# Patient Record
Sex: Male | Born: 1975
Health system: Southern US, Community
[De-identification: ages and names within clinical notes are randomized; demographics above are authoritative.]

## PROBLEM LIST (undated history)

## (undated) DIAGNOSIS — C44300 Unspecified malignant neoplasm of skin of unspecified part of face: Secondary | ICD-10-CM

## (undated) DIAGNOSIS — J189 Pneumonia, unspecified organism: Secondary | ICD-10-CM

## (undated) DIAGNOSIS — F419 Anxiety disorder, unspecified: Secondary | ICD-10-CM

## (undated) DIAGNOSIS — E119 Type 2 diabetes mellitus without complications: Secondary | ICD-10-CM

## (undated) DIAGNOSIS — B019 Varicella without complication: Secondary | ICD-10-CM

## (undated) HISTORY — DX: Varicella without complication: B01.9

## (undated) HISTORY — PX: BACK SURGERY: SHX140

## (undated) HISTORY — DX: Unspecified malignant neoplasm of skin of unspecified part of face: C44.300

## (undated) HISTORY — PX: OTHER SURGICAL HISTORY: SHX169

---

## 1999-08-17 ENCOUNTER — Emergency Department (HOSPITAL_COMMUNITY): Admission: EM | Admit: 1999-08-17 | Discharge: 1999-08-17 | Payer: Self-pay | Admitting: Emergency Medicine

## 1999-12-21 ENCOUNTER — Encounter: Payer: Self-pay | Admitting: Emergency Medicine

## 1999-12-21 ENCOUNTER — Emergency Department (HOSPITAL_COMMUNITY): Admission: EM | Admit: 1999-12-21 | Discharge: 1999-12-21 | Payer: Self-pay | Admitting: Emergency Medicine

## 2004-12-14 ENCOUNTER — Ambulatory Visit: Payer: Self-pay

## 2007-01-02 ENCOUNTER — Emergency Department: Payer: Self-pay | Admitting: Unknown Physician Specialty

## 2007-03-01 ENCOUNTER — Ambulatory Visit: Payer: Self-pay | Admitting: General Practice

## 2007-04-20 ENCOUNTER — Ambulatory Visit: Payer: Self-pay | Admitting: Otolaryngology

## 2007-04-25 ENCOUNTER — Ambulatory Visit: Payer: Self-pay | Admitting: General Practice

## 2007-11-03 ENCOUNTER — Emergency Department: Payer: Self-pay | Admitting: Internal Medicine

## 2008-01-28 ENCOUNTER — Ambulatory Visit: Payer: Self-pay

## 2008-04-11 ENCOUNTER — Emergency Department: Payer: Self-pay | Admitting: Emergency Medicine

## 2008-06-09 ENCOUNTER — Ambulatory Visit: Payer: Self-pay | Admitting: Unknown Physician Specialty

## 2010-03-11 ENCOUNTER — Ambulatory Visit: Payer: Self-pay | Admitting: Family Medicine

## 2011-02-08 ENCOUNTER — Ambulatory Visit: Payer: Self-pay | Admitting: Family Medicine

## 2012-10-14 ENCOUNTER — Emergency Department: Payer: Self-pay | Admitting: Emergency Medicine

## 2012-10-14 LAB — RAPID INFLUENZA A&B ANTIGENS

## 2013-04-29 ENCOUNTER — Emergency Department: Payer: Self-pay | Admitting: Emergency Medicine

## 2013-05-21 ENCOUNTER — Other Ambulatory Visit: Payer: Self-pay | Admitting: Neurosurgery

## 2013-06-06 ENCOUNTER — Encounter (HOSPITAL_COMMUNITY)
Admission: RE | Admit: 2013-06-06 | Discharge: 2013-06-06 | Disposition: A | Discharge status: Home / Self Care | Payer: Self-pay | Source: Ambulatory Visit | Attending: Neurosurgery | Admitting: Neurosurgery

## 2013-06-06 NOTE — Pre-Procedure Instructions (Signed)
[place]  06/06/2013   Your procedure is scheduled on:  06/13/13  Report to [facility] at 530 AM.  Call this number if you have problems the morning of surgery: [phone]   Remember:   Do not eat food or drink liquids after midnight.   Take these medicines the morning of surgery with A SIP OF WATER: none   Do not wear jewelry, make-up or nail polish.  Do not wear lotions, powders, or perfumes. You may wear deodorant.  Do not shave 48 hours prior to surgery. Men may shave face and neck.  Do not bring valuables to the hospital.  [facility] is not responsible                   for any belongings or valuables.  Contacts, dentures or bridgework may not be worn into surgery.  Leave suitcase in the car. After surgery it may be brought to your room.  For patients admitted to the hospital, checkout time is 11:00 AM the day of  discharge.   Patients discharged the day of surgery will not be allowed to drive  home.  Name and phone number of your driver: family  Special Instructions: Shower using CHG 2 nights before surgery and the night before surgery.  If you shower the day of surgery use CHG.  Use special wash - you have one bottle of CHG for all showers.  You should use approximately 1/3 of the bottle for each shower.   Please read over the following fact sheets that you were given: Pain Booklet, Coughing and Deep Breathing and Surgical Site Infection Prevention

## 2013-06-13 ENCOUNTER — Ambulatory Visit (HOSPITAL_COMMUNITY): Admission: RE | Admit: 2013-06-13 | Payer: Self-pay | Source: Ambulatory Visit | Admitting: Neurosurgery

## 2013-06-13 ENCOUNTER — Encounter (HOSPITAL_COMMUNITY): Admission: RE | Payer: Self-pay | Source: Ambulatory Visit

## 2013-06-13 SURGERY — ANTERIOR CERVICAL DECOMPRESSION/DISCECTOMY FUSION 1 LEVEL
Anesthesia: General

## 2014-04-09 DIAGNOSIS — Z0289 Encounter for other administrative examinations: Secondary | ICD-10-CM

## 2014-06-13 ENCOUNTER — Encounter (HOSPITAL_COMMUNITY): Payer: Self-pay | Admitting: Emergency Medicine

## 2014-06-13 DIAGNOSIS — Z79899 Other long term (current) drug therapy: Secondary | ICD-10-CM | POA: Insufficient documentation

## 2014-06-13 DIAGNOSIS — Z043 Encounter for examination and observation following other accident: Secondary | ICD-10-CM | POA: Insufficient documentation

## 2014-06-13 DIAGNOSIS — W1809XA Striking against other object with subsequent fall, initial encounter: Secondary | ICD-10-CM | POA: Insufficient documentation

## 2014-06-13 DIAGNOSIS — F411 Generalized anxiety disorder: Secondary | ICD-10-CM | POA: Insufficient documentation

## 2014-06-13 DIAGNOSIS — Y92009 Unspecified place in unspecified non-institutional (private) residence as the place of occurrence of the external cause: Secondary | ICD-10-CM | POA: Insufficient documentation

## 2014-06-13 DIAGNOSIS — Z9889 Other specified postprocedural states: Secondary | ICD-10-CM | POA: Insufficient documentation

## 2014-06-13 DIAGNOSIS — R42 Dizziness and giddiness: Secondary | ICD-10-CM | POA: Insufficient documentation

## 2014-06-13 DIAGNOSIS — R404 Transient alteration of awareness: Secondary | ICD-10-CM | POA: Insufficient documentation

## 2014-06-13 DIAGNOSIS — Y9389 Activity, other specified: Secondary | ICD-10-CM | POA: Insufficient documentation

## 2014-06-13 NOTE — ED Notes (Signed)
Pt. felt dizzy lost his balance and fell at home this evening , denies LOC / ambulatory , pt. stated he hit his left face against the wall , alert and oriented / respirations unlabored .

## 2014-06-14 ENCOUNTER — Emergency Department (HOSPITAL_COMMUNITY): Payer: 59

## 2014-06-14 ENCOUNTER — Emergency Department (HOSPITAL_COMMUNITY)
Admission: EM | Admit: 2014-06-14 | Discharge: 2014-06-14 | Disposition: A | Discharge status: Home / Self Care | Payer: 59 | Attending: Emergency Medicine | Admitting: Emergency Medicine

## 2014-06-14 DIAGNOSIS — R4020 Unspecified coma: Secondary | ICD-10-CM

## 2014-06-14 HISTORY — DX: Anxiety disorder, unspecified: F41.9

## 2014-06-14 LAB — COMPREHENSIVE METABOLIC PANEL
ALT: 23 U/L (ref 0–53)
AST: 19 U/L (ref 0–37)
Albumin: 3.8 g/dL (ref 3.5–5.2)
Alkaline Phosphatase: 66 U/L (ref 39–117)
Anion gap: 15 (ref 5–15)
BUN: 16 mg/dL (ref 6–23)
CO2: 21 mEq/L (ref 19–32)
Calcium: 8.6 mg/dL (ref 8.4–10.5)
Chloride: 101 mEq/L (ref 96–112)
Creatinine, Ser: 0.88 mg/dL (ref 0.50–1.35)
GFR calc Af Amer: >90 mL/min (ref >90)
GFR calc non Af Amer: >90 mL/min (ref >90)
Glucose, Bld: 204 mg/dL — ABNORMAL HIGH (ref 70–99)
Potassium: 4.1 mEq/L (ref 3.7–5.3)
Sodium: 137 mEq/L (ref 137–147)
Total Bilirubin: 0.3 mg/dL (ref 0.3–1.2)
Total Protein: 6.7 g/dL (ref 6.0–8.3)

## 2014-06-14 LAB — CBC WITH DIFFERENTIAL/PLATELET
Basophils Absolute: 0 10*3/uL (ref 0.0–0.1)
Basophils Relative: 0 % (ref 0–1)
Eosinophils Absolute: 0.1 10*3/uL (ref 0.0–0.7)
Eosinophils Relative: 2 % (ref 0–5)
HCT: 42.7 % (ref 39.0–52.0)
Hemoglobin: 15.3 g/dL (ref 13.0–17.0)
Lymphocytes Relative: 27 % (ref 12–46)
Lymphs Abs: 1.6 10*3/uL (ref 0.7–4.0)
MCH: 32.5 pg (ref 26.0–34.0)
MCHC: 35.8 g/dL (ref 30.0–36.0)
MCV: 90.7 fL (ref 78.0–100.0)
Monocytes Absolute: 0.5 10*3/uL (ref 0.1–1.0)
Monocytes Relative: 9 % (ref 3–12)
Neutro Abs: 3.7 10*3/uL (ref 1.7–7.7)
Neutrophils Relative %: 62 % (ref 43–77)
Platelets: 252 10*3/uL (ref 150–400)
RBC: 4.71 MIL/uL (ref 4.22–5.81)
RDW: 13 % (ref 11.5–15.5)
WBC: 6 10*3/uL (ref 4.0–10.5)

## 2014-06-14 NOTE — ED Provider Notes (Signed)
CSN: [id number]     Arrival date & time 06/13/14  2338 History   First MD Initiated Contact with Patient 06/14/14 0028     Chief Complaint  Patient presents with  . Fall     (Consider location/radiation/quality/duration/timing/severity/associated sxs/prior Treatment) HPI The patient presents after an episode of near syncope. Patient recalls feeling flushed, and soon after using the bathroom, and after standing up, felt lightheaded, lost his balance, staggered, and fell.  He denies complete loss of consciousness, but felt near syncopal for several moments. There is no ongoing chest pain throughout, no headache, no confusion, no disorientation. Symptoms have resolved entirely. No similar prior episodes. Patient notes a history of anxiety, cervical spine surgery. Patient currently taking phentermine, clonazepam, Flexeril, ibuprofen. Patient had intercourse approximately 20 minutes prior to this.  Past Medical History  Diagnosis Date  . Anxiety    Past Surgical History  Procedure Laterality Date  . Back surgery     No family history on file. History  Substance Use Topics  . Smoking status: Never Smoker   . Smokeless tobacco: Not on file  . Alcohol Use: Yes    Review of Systems  Constitutional:       Per HPI, otherwise negative  HENT:       Per HPI, otherwise negative  Respiratory:       Per HPI, otherwise negative  Cardiovascular:       Per HPI, otherwise negative  Gastrointestinal: Negative for vomiting.  Endocrine:       Negative aside from HPI  Genitourinary:       Neg aside from HPI   Musculoskeletal:       Per HPI, otherwise negative  Skin: Negative.   Neurological: Positive for dizziness and light-headedness. Negative for syncope, facial asymmetry and headaches.      Allergies  Bee venom  Home Medications   Prior to Admission medications   Medication Sig Start Date End Date Taking? Authorizing Provider  clonazePAM (KLONOPIN) 0.5 MG tablet Take 0.5 mg  by mouth 2 (two) times daily as needed for anxiety.   Yes Historical Provider, MD  cyclobenzaprine (FLEXERIL) 10 MG tablet Take 5 mg by mouth 3 (three) times daily as needed for muscle spasms.   Yes Historical Provider, MD   BP 101/60  Pulse 94  Temp(Src) 98.5 F (36.9 C) (Oral)  Resp 17  Ht 6' (1.829 m)  Wt 243 lb (110.224 kg)  BMI 32.95 kg/m2  SpO2 97% Physical Exam  Nursing note and vitals reviewed. Constitutional: He is oriented to person, place, and time. He appears well-developed. No distress.  HENT:  Head: Normocephalic and atraumatic.  Eyes: Conjunctivae and EOM are normal.  Cardiovascular: Normal rate and regular rhythm.   Pulmonary/Chest: Effort normal. No stridor. No respiratory distress.  Abdominal: He exhibits no distension.  Musculoskeletal: He exhibits no edema.  Neurological: He is alert and oriented to person, place, and time. No cranial nerve deficit. He exhibits normal muscle tone. Coordination normal.  Skin: Skin is warm and dry.  Psychiatric: He has a normal mood and affect.    ED Course  Procedures (including critical care time) Labs Review Labs Reviewed  COMPREHENSIVE METABOLIC PANEL - Abnormal; Notable for the following:    Glucose, Bld 204 (*)    All other components within normal limits  CBC WITH DIFFERENTIAL    Imaging Review Dg Chest 2 View  06/14/2014   CLINICAL DATA:  Near syncope.  EXAM: CHEST  2 VIEW  COMPARISON:  None.  FINDINGS: Markedly suboptimal inspiration with atelectasis in the left lower lobe. Lungs otherwise clear. No localized airspace consolidation. No pleural effusions. No pneumothorax. Normal pulmonary vascularity. Cardiomediastinal silhouette unremarkable. Visualized bony thorax intact.  IMPRESSION: Markedly suboptimal inspiration accounts for atelectasis in the left lower lobe. No acute cardiopulmonary disease otherwise.   Electronically Signed   By: Evangeline Dakin M.D.   On: 06/14/2014 01:23     EKG  Interpretation   Date/Time:  Friday June 13 2014 23:41:36 EDT Ventricular Rate:  108 PR Interval:  130 QRS Duration: 88 QT Interval:  322 QTC Calculation: 431 R Axis:   60 Text Interpretation:  Sinus tachycardia Nonspecific ST abnormality  Abnormal ECG Sinus tachycardia delta waves ? ST-t wave abnormality  Abnormal ekg Confirmed by Carmin Muskrat  MD ([id number]) on 06/14/2014 12:28:51  AM     3:48 AM Patient asleep.  He awakens easily.  I discussed the findings again with him and his wife. Patient will follow up with primary care and/or cardiology on Monday for Holter monitoring. Patient is essentially asymptomatic, though he states he feels mildly"off". Return precautions, follow up instructions reinforced MDM   Patient presents after a likely syncopal events. Patient is awake and alert, neurologically intact and in stable, and in no distress.  Per the patient's emergency room course, he remained hemodynamically stable, with slight decrease in tachycardia, and now a new episode of lightheadedness, syncope, pain. Patient's evaluation is largely reassuring, and after several hours of monitoring, with no decompensation the patient was discharged in stable condition to followup with primary care or cardiology do to his possible syncopal events, which seems most consistent with vagal etiology.     Carmin Muskrat, MD 06/14/14 [phone]

## 2014-06-14 NOTE — Discharge Instructions (Signed)
As discussed, your evaluation tonight has been largely reassuring.  Your episode of losing consciousness is likely due to vagal syncope.  However, it is important to follow up with either your primary care physician or our cardiologists for appropriate ongoing evaluation.  Return here if you develop new, or concerning changes in the interim

## 2014-07-28 ENCOUNTER — Encounter: Payer: Self-pay | Admitting: Internal Medicine

## 2014-07-28 ENCOUNTER — Ambulatory Visit (INDEPENDENT_AMBULATORY_CARE_PROVIDER_SITE_OTHER): Payer: 59 | Admitting: Internal Medicine

## 2014-07-28 VITALS — BP 124/68 | HR 82 | Temp 98.7°F | Ht 70.66 in | Wt 237.0 lb

## 2014-07-28 DIAGNOSIS — E669 Obesity, unspecified: Secondary | ICD-10-CM

## 2014-07-28 DIAGNOSIS — F411 Generalized anxiety disorder: Secondary | ICD-10-CM

## 2014-07-28 MED ORDER — CITALOPRAM HYDROBROMIDE 10 MG PO TABS: 10.0000 mg | ORAL_TABLET | Freq: Every day | ORAL | Status: DC

## 2014-07-28 NOTE — Assessment & Plan Note (Signed)
Encouraged him to continue to work on diet and exercise He wants to continue phentermine but has plenty of refills left from previous provider

## 2014-07-28 NOTE — Progress Notes (Signed)
HPI  Pt presents to the clinic today to establish care. He is transferring care from Dr. Brunetta Genera. He does have some concerns about his anxiety medicine. He reports he does not like to take the clonazepam. He will take it when his nerves get very bad. He reports this occurs 2-3 times per week. He denies depression, SI/HI.  Flu: 2014, wants one today Tetanus: 2009 Dentist: biannually  Past Medical History  Diagnosis Date  . Anxiety   . Chicken pox   . Skin cancer of face     Current Outpatient Prescriptions  Medication Sig Dispense Refill  . cholecalciferol (VITAMIN D) 1000 UNITS tablet Take 1,000 Units by mouth once a week.      . phentermine 37.5 MG capsule Take 37.5 mg by mouth every morning.       No current facility-administered medications for this visit.    Allergies  Allergen Reactions  . Bee Venom Anaphylaxis    Family History  Problem Relation Age of Onset  . Heart disease Mother   . Diabetes Mother   . Heart disease Father   . Diabetes Father     History   Social History  . Marital Status: Married    Spouse Name: N/A    Number of Children: N/A  . Years of Education: N/A   Occupational History  . Not on file.   Social History Main Topics  . Smoking status: Never Smoker   . Smokeless tobacco: Current User    Types: Chew  . Alcohol Use: Yes     Comment: occasional  . Drug Use: No  . Sexual Activity: Not on file   Other Topics Concern  . Not on file   Social History Narrative  . No narrative on file    ROS:  Constitutional: Denies fever, malaise, fatigue, headache or abrupt weight changes.  Respiratory: Denies difficulty breathing, shortness of breath, cough or sputum production.   Cardiovascular: Denies chest pain, chest tightness, palpitations or swelling in the hands or feet.  Neurological: Denies dizziness, difficulty with memory, difficulty with speech or problems with balance and coordination.  Psych: Pt reports anxiety. Denies  depression, SI/HI.  No other specific complaints in a complete review of systems (except as listed in HPI above).  PE:  BP 124/68  Pulse 82  Temp(Src) 98.7 F (37.1 C) (Oral)  Ht 5' 10.66" (1.795 m)  Wt 237 lb (107.502 kg)  BMI 33.36 kg/m2  SpO2 98% Wt Readings from Last 3 Encounters:  07/28/14 237 lb (107.502 kg)  06/13/14 243 lb (110.224 kg)    General: Appears his stated age, obese but well developed, well nourished in NAD. Cardiovascular: Normal rate and rhythm. S1,S2 noted.  No murmur, rubs or gallops noted. Pulmonary/Chest: Normal effort and positive vesicular breath sounds. No respiratory distress. No wheezes, rales or ronchi noted.  Psychiatric: Mood and affect normal. Behavior is normal. Judgment and thought content normal.     BMET    Component Value Date/Time   NA 137 06/13/2014 2352   K 4.1 06/13/2014 2352   CL 101 06/13/2014 2352   CO2 21 06/13/2014 2352   GLUCOSE 204* 06/13/2014 2352   BUN 16 06/13/2014 2352   CREATININE 0.88 06/13/2014 2352   CALCIUM 8.6 06/13/2014 2352   GFRNONAA >90 06/13/2014 2352   GFRAA >90 06/13/2014 2352    CBC    Component Value Date/Time   WBC 6.0 06/13/2014 2352   RBC 4.71 06/13/2014 2352   HGB 15.3 06/13/2014 2352  HCT 42.7 06/13/2014 2352   PLT 252 06/13/2014 2352   MCV 90.7 06/13/2014 2352   MCH 32.5 06/13/2014 2352   MCHC 35.8 06/13/2014 2352   RDW 13.0 06/13/2014 2352   LYMPHSABS 1.6 06/13/2014 2352   MONOABS 0.5 06/13/2014 2352   EOSABS 0.1 06/13/2014 2352   BASOSABS 0.0 06/13/2014 2352      Assessment and Plan:  Health Maintenance:  Flu shot today  RTC in 3 months for annual physical

## 2014-07-28 NOTE — Assessment & Plan Note (Signed)
Stop clonazepam Start Celexa

## 2014-07-28 NOTE — Patient Instructions (Signed)
Generalized Anxiety Disorder Generalized anxiety disorder (GAD) is a mental disorder. It interferes with life functions, including relationships, work, and school. GAD is different from normal anxiety, which everyone experiences at some point in their lives in response to specific life events and activities. Normal anxiety actually helps us prepare for and get through these life events and activities. Normal anxiety goes away after the event or activity is over.  GAD causes anxiety that is not necessarily related to specific events or activities. It also causes excess anxiety in proportion to specific events or activities. The anxiety associated with GAD is also difficult to control. GAD can vary from mild to severe. People with severe GAD can have intense waves of anxiety with physical symptoms (panic attacks).  SYMPTOMS The anxiety and worry associated with GAD are difficult to control. This anxiety and worry are related to many life events and activities and also occur more days than not for 6 months or longer. People with GAD also have three or more of the following symptoms (one or more in children):  Restlessness.   Fatigue.  Difficulty concentrating.   Irritability.  Muscle tension.  Difficulty sleeping or unsatisfying sleep. DIAGNOSIS GAD is diagnosed through an assessment by your health care provider. Your health care provider will ask you questions aboutyour mood,physical symptoms, and events in your life. Your health care provider may ask you about your medical history and use of alcohol or drugs, including prescription medicines. Your health care provider may also do a physical exam and blood tests. Certain medical conditions and the use of certain substances can cause symptoms similar to those associated with GAD. Your health care provider may refer you to a mental health specialist for further evaluation. TREATMENT The following therapies are usually used to treat GAD:    Medication. Antidepressant medication usually is prescribed for long-term daily control. Antianxiety medicines may be added in severe cases, especially when panic attacks occur.   Talk therapy (psychotherapy). Certain types of talk therapy can be helpful in treating GAD by providing support, education, and guidance. A form of talk therapy called cognitive behavioral therapy can teach you healthy ways to think about and react to daily life events and activities.  Stress managementtechniques. These include yoga, meditation, and exercise and can be very helpful when they are practiced regularly. A mental health specialist can help determine which treatment is best for you. Some people see improvement with one therapy. However, other people require a combination of therapies. Document Released: 02/25/2013 Document Revised: 03/17/2014 Document Reviewed: 02/25/2013 Exit[employer] Patient Information 2015 [employer]. This information is not intended to replace advice given to you by your health care provider. Make sure you discuss any questions you have with your health care provider.  

## 2014-07-28 NOTE — Progress Notes (Signed)
Pre visit review using our clinic review tool, if applicable. No additional management support is needed unless otherwise documented below in the visit note. 

## 2014-09-15 ENCOUNTER — Encounter: Payer: Self-pay | Admitting: Internal Medicine

## 2014-09-15 ENCOUNTER — Ambulatory Visit (INDEPENDENT_AMBULATORY_CARE_PROVIDER_SITE_OTHER): Payer: 59 | Admitting: Internal Medicine

## 2014-09-15 VITALS — BP 120/74 | HR 87 | Temp 98.8°F | Wt 245.0 lb

## 2014-09-15 DIAGNOSIS — L989 Disorder of the skin and subcutaneous tissue, unspecified: Secondary | ICD-10-CM

## 2014-09-15 NOTE — Progress Notes (Signed)
Subjective:    Patient ID: Joshua Bean, male    DOB: 1976-06-17, 38 y.o.   MRN: [id number]  HPI  Pt presents to the clinic today requesting referral for dermatology. He reports that over the last few months, he has noticed a few spots on his face and hands that he would like to have checked. The areas do not itch or burn but they do bother him. He has had precancerous cells on his face frozen off multiple times. He reports that they always come back and end up looking worse. He has been seen by [facility]. He does not want to go back there.  Review of Systems  Past Medical History  Diagnosis Date  . Anxiety   . Chicken pox   . Skin cancer of face     Current Outpatient Prescriptions  Medication Sig Dispense Refill  . cyclobenzaprine (FLEXERIL) 10 MG tablet Take 5 mg by mouth as needed for muscle spasms.    . meloxicam (MOBIC) 15 MG tablet Take 15 mg by mouth daily as needed for pain.     No current facility-administered medications for this visit.    Allergies  Allergen Reactions  . Bee Venom Anaphylaxis    Family History  Problem Relation Age of Onset  . Heart disease Mother   . Diabetes Mother   . Heart disease Father   . Diabetes Father   . Cancer Neg Hx   . Stroke Neg Hx     History   Social History  . Marital Status: Married    Spouse Name: N/A    Number of Children: N/A  . Years of Education: N/A   Occupational History  . Not on file.   Social History Main Topics  . Smoking status: Never Smoker   . Smokeless tobacco: Current User    Types: Chew  . Alcohol Use: Yes     Comment: occasional  . Drug Use: No  . Sexual Activity: Yes   Other Topics Concern  . Not on file   Social History Narrative     Constitutional: Denies fever, malaise, fatigue, headache or abrupt weight changes.  Skin: Pt reports lesions on face. Denies redness, rashes, or ulcercations.    No other specific complaints in a complete review of systems (except as  listed in HPI above).     Objective:   Physical Exam  BP 120/74 mmHg  Pulse 87  Temp(Src) 98.8 F (37.1 C) (Oral)  Wt 245 lb (111.131 kg)  SpO2 98% Wt Readings from Last 3 Encounters:  09/15/14 245 lb (111.131 kg)  07/28/14 237 lb (107.502 kg)  06/13/14 243 lb (110.224 kg)    General: Appears his stated age, obese but well developed, well nourished in NAD. Skin: Round < 1 cm scaly lesion noted just under left eye. Round < 1 cm scaly lesion noted on left upper cheek. Dark, oval, < 1 cm lesion noted on right side of nose. Dark, oval, < 1 cm lesion noted on right cheek. Cardiovascular: Normal rate and rhythm. S1,S2 noted.  No murmur, rubs or gallops noted. Pulmonary/Chest: Normal effort and positive vesicular breath sounds. No respiratory distress. No wheezes, rales or ronchi noted.   BMET    Component Value Date/Time   NA 137 06/13/2014 2352   K 4.1 06/13/2014 2352   CL 101 06/13/2014 2352   CO2 21 06/13/2014 2352   GLUCOSE 204* 06/13/2014 2352   BUN 16 06/13/2014 2352   CREATININE 0.88 06/13/2014  2352   CALCIUM 8.6 06/13/2014 2352   GFRNONAA >90 06/13/2014 2352   GFRAA >90 06/13/2014 2352    Lipid Panel  No results found for: CHOL, TRIG, HDL, CHOLHDL, VLDL, LDLCALC  CBC    Component Value Date/Time   WBC 6.0 06/13/2014 2352   RBC 4.71 06/13/2014 2352   HGB 15.3 06/13/2014 2352   HCT 42.7 06/13/2014 2352   PLT 252 06/13/2014 2352   MCV 90.7 06/13/2014 2352   MCH 32.5 06/13/2014 2352   MCHC 35.8 06/13/2014 2352   RDW 13.0 06/13/2014 2352   LYMPHSABS 1.6 06/13/2014 2352   MONOABS 0.5 06/13/2014 2352   EOSABS 0.1 06/13/2014 2352   BASOSABS 0.0 06/13/2014 2352    Hgb A1C No results found for: HGBA1C       Assessment & Plan:   Multiple skin lesions of face:  Because they are on his face, I would prefer they be evaluated/excised by derm Instructed him to avoid touching/scratching the areas Will refer to derm- see Rosaria Ferries on your way out  RTC as needed  or if symptoms persist or worsen

## 2014-09-15 NOTE — Patient Instructions (Signed)
Excision of Skin Lesions  Excision of a skin lesion refers to the removal of a section of skin by making small cuts (incisions) in the skin. This is typically done to remove a cancerous growth (basal cell carcinoma, squamous cell carcinoma, or melanoma) or a noncancerous growth (cyst). It may be done to treat or prevent cancer or infection. It may also be done to improve cosmetic appearance (removal of mole, skin tag).  LET YOUR CAREGIVER KNOW ABOUT:   · Allergies to food or medicine.  · Medicines taken, including vitamins, herbs, eyedrops, over-the-counter medicines, and creams.  · Use of steroids (by mouth or creams).  · Previous problems with anesthetics or numbing medicines.  · History of bleeding problems or blood clots.  · History of any prostheses.  · Previous surgery.  · Other health problems, including diabetes and kidney problems.  · Possibility of pregnancy, if this applies.  RISKS AND COMPLICATIONS   Many complications can be managed. With appropriate treatment and rehabilitation, the following complications are very uncommon:  · Bleeding.  · Infection.  · Scarring.  · Recurrence of cyst or cancer.  · Changes in skin sensation or appearance (discoloration, swelling).  · Reaction to anesthesia.  · Allergic reaction to surgical materials or ointments.  · Damage to nerves, blood vessels, muscles, or other structures.  · Continued pain.  BEFORE THE PROCEDURE   It is important to follow your caregiver's instructions prior to your procedure to avoid complications. Steps before your procedure may include:  · Physical exam, blood tests, other procedures, such as removing a small sample for examination under a microscope (biopsy).  · Your caregiver may review the procedure, the anesthesia being used, and what to expect after the procedure with you.  You may be asked to:  · Stop taking certain medicines, such as blood thinners (including aspirin, clopidogrel, ibuprofen), for several days prior to your  procedure.  · Take certain medicines.  · Stop smoking.  It is a good idea to arrange for a ride home after surgery and to have someone to help you with activities during recovery.  PROCEDURE   There are several excision techniques. The type of excision or surgical technique used will depend on your condition, the location of the lesion, and your overall health. After the lesion is sterilized and a local anesthetic is applied, the following may be performed:  Complete surgical excision  The area to be removed is marked with a pen. Using a small scalpel and scissors, the surgeon gently cuts around and under the lesion until it is completely removed. The lesion is placed in a special fluid and sent to the lab for examination. If necessary, bleeding will be controlled with a device that delivers heat. The edges of the wound are stitched together and a dressing is applied. This procedure may be performed to treat a cancerous growth or noncancerous cyst or lesion. Surgeons commonly perform an elliptical excision, to minimize scarring.  Excision of a cyst  The surgeon makes an incision on the cyst. The entire cyst is removed through the incision. The wound may be closed with a suture (stitch).  Shave excision  During shave excision, the surgeon uses a small blade or loop instrument to shave off the lesion. This may be done to remove a mole or skin tag. The wound is usually left to heal on its own without stitches.  Punch excision  During punch excision, the surgeon uses a small, round tool (like a cookie   cutter) to cut a circle shape out of the skin. The outer edges of the skin are stitched together. This may be done to remove a mole or scar or to perform a biopsy of the lesion.  Mohs micrographic surgery  During Mohs micrographic surgery, layers of the lesion are removed with a scalpel or loop instrument and immediately examined under a microscope until all of the abnormal or cancerous tissue is removed. This procedure is  minimally invasive and ensures the best cosmetic outcome, with removal of as little normal tissue as possible. Mohs is usually done to treat skin cancer, such as basal cell carcinoma or squamous cell carcinoma, particularly on the face and ears.  Antibiotic ointment is applied to the surgical area after each of the procedures listed above, as necessary.  AFTER THE PROCEDURE   How well you heal depends on many factors. Most patients heal quite well with proper techniques and self-care. Scarring will lessen over time.  HOME CARE INSTRUCTIONS   · Take medicines for pain as directed.  · Keep the incision area clean, dry, and protected for at least 48 hours. Change dressings as directed.  · For bleeding, apply gentle but firm pressure to the wound using a folded towel for 20 minutes. Call your caregiver if bleeding does not stop.  · Avoid high-impact exercise and activities until the stitches are removed or the area heals.  · Follow your caregiver's instructions to minimize scarring. Avoid sun exposure until the area has healed. Scarring should lessen over time.  · Follow up with your caregiver as directed. Removal of stitches within 4 to 14 days may be necessary.  Finding out the results of your test  Not all test results are available during your visit. If your test results are not back during the visit, make an appointment with your caregiver to find out the results. Do not assume everything is normal if you have not heard from your caregiver or the medical facility. It is important for you to follow up on all of your test results.  SEEK MEDICAL CARE IF:   · You or your child has an oral temperature above 102° F (38.9° C).  · You develop signs of infection (chills, feeling unwell).  · You notice bleeding, pain, discharge, redness, or swelling at the incision site.  · You notice skin irregularities or changes in sensation.  MAKE SURE YOU:   · Understand these instructions.  · Will watch your condition.  · Will get help  right away if you are not doing well or get worse.  FOR MORE INFORMATION   American Academy of Family Physicians: www.aafp.org  American Academy of Dermatology: www.aad.org  Document Released: 01/25/2010 Document Revised: 01/23/2012 Document Reviewed: 01/25/2010  ExitCare® Patient Information ©2015 [employer]. This information is not intended to replace advice given to you by your health care provider. Make sure you discuss any questions you have with your health care provider.

## 2014-09-15 NOTE — Progress Notes (Signed)
Pre visit review using our clinic review tool, if applicable. No additional management support is needed unless otherwise documented below in the visit note. 

## 2014-09-16 ENCOUNTER — Telehealth: Payer: Self-pay | Admitting: Internal Medicine

## 2014-09-16 NOTE — Telephone Encounter (Signed)
emmi mailed  °

## 2014-10-13 ENCOUNTER — Other Ambulatory Visit: Payer: Self-pay | Admitting: Internal Medicine

## 2014-10-13 DIAGNOSIS — Z0000 Encounter for general adult medical examination without abnormal findings: Secondary | ICD-10-CM

## 2014-10-20 ENCOUNTER — Other Ambulatory Visit: Payer: 59

## 2014-10-21 ENCOUNTER — Other Ambulatory Visit (INDEPENDENT_AMBULATORY_CARE_PROVIDER_SITE_OTHER): Payer: 59

## 2014-10-21 DIAGNOSIS — Z0000 Encounter for general adult medical examination without abnormal findings: Secondary | ICD-10-CM

## 2014-10-21 NOTE — Addendum Note (Signed)
Addended by: Ellamae Sia on: 10/21/2014 02:25 PM   Modules accepted: Orders

## 2014-10-22 LAB — CBC WITH DIFFERENTIAL
Basophils Absolute: 0.1 10*3/uL (ref 0.0–0.2)
Basos: 1 %
Eos: 4 %
Eosinophils Absolute: 0.2 10*3/uL (ref 0.0–0.4)
HCT: 45.4 % (ref 37.5–51.0)
Hemoglobin: 15.7 g/dL (ref 12.6–17.7)
Immature Grans (Abs): 0.1 10*3/uL (ref 0.0–0.1)
Immature Granulocytes: 1 %
Lymphocytes Absolute: 2.6 10*3/uL (ref 0.7–3.1)
Lymphs: 43 %
MCH: 31.5 pg (ref 26.6–33.0)
MCHC: 34.6 g/dL (ref 31.5–35.7)
MCV: 91 fL (ref 79–97)
Monocytes Absolute: 0.5 10*3/uL (ref 0.1–0.9)
Monocytes: 8 %
Neutrophils Absolute: 2.6 10*3/uL (ref 1.4–7.0)
Neutrophils Relative %: 43 %
Platelets: 278 10*3/uL (ref 150–379)
RBC: 4.99 x10E6/uL (ref 4.14–5.80)
RDW: 13.6 % (ref 12.3–15.4)
WBC: 6 10*3/uL (ref 3.4–10.8)

## 2014-10-22 LAB — COMPREHENSIVE METABOLIC PANEL
ALT: 25 IU/L (ref 0–44)
AST: 18 IU/L (ref 0–40)
Albumin/Globulin Ratio: 1.9 (ref 1.1–2.5)
Albumin: 4.6 g/dL (ref 3.5–5.5)
Alkaline Phosphatase: 50 IU/L (ref 39–117)
BUN/Creatinine Ratio: 20 — ABNORMAL HIGH (ref 8–19)
BUN: 17 mg/dL (ref 6–20)
CO2: 20 mmol/L (ref 18–29)
Calcium: 9.4 mg/dL (ref 8.7–10.2)
Chloride: 102 mmol/L (ref 97–108)
Creatinine, Ser: 0.84 mg/dL (ref 0.76–1.27)
GFR calc Af Amer: 129 mL/min/{1.73_m2} (ref >59)
GFR calc non Af Amer: 112 mL/min/{1.73_m2} (ref >59)
Globulin, Total: 2.4 g/dL (ref 1.5–4.5)
Glucose: 145 mg/dL — ABNORMAL HIGH (ref 65–99)
Potassium: 4.5 mmol/L (ref 3.5–5.2)
Sodium: 140 mmol/L (ref 134–144)
Total Bilirubin: 0.5 mg/dL (ref 0.0–1.2)
Total Protein: 7 g/dL (ref 6.0–8.5)

## 2014-10-22 LAB — LIPID PANEL
Chol/HDL Ratio: 6.1 ratio units — ABNORMAL HIGH (ref 0.0–5.0)
Cholesterol, Total: 221 mg/dL — ABNORMAL HIGH (ref 100–199)
HDL: 36 mg/dL — ABNORMAL LOW (ref >39)
Triglycerides: 463 mg/dL — ABNORMAL HIGH (ref 0–149)

## 2014-10-27 ENCOUNTER — Encounter: Payer: 59 | Admitting: Internal Medicine

## 2014-11-03 ENCOUNTER — Encounter: Payer: Self-pay | Admitting: Internal Medicine

## 2014-11-03 ENCOUNTER — Ambulatory Visit (INDEPENDENT_AMBULATORY_CARE_PROVIDER_SITE_OTHER): Payer: 59 | Admitting: Internal Medicine

## 2014-11-03 VITALS — BP 118/76 | HR 99 | Temp 99.3°F | Ht 70.75 in | Wt 244.0 lb

## 2014-11-03 DIAGNOSIS — E1165 Type 2 diabetes mellitus with hyperglycemia: Secondary | ICD-10-CM | POA: Insufficient documentation

## 2014-11-03 DIAGNOSIS — Z0000 Encounter for general adult medical examination without abnormal findings: Secondary | ICD-10-CM

## 2014-11-03 DIAGNOSIS — E785 Hyperlipidemia, unspecified: Secondary | ICD-10-CM | POA: Insufficient documentation

## 2014-11-03 NOTE — Progress Notes (Signed)
Subjective:    Patient ID: Joshua Bean, male    DOB: December 13, 1975, 38 y.o.   MRN: [id number]  HPI  Pt presents to the clinic today for his annual exam.  Flu: 07/2014 Tetanus: 2008, he thinks Dentist: biannually  Diet: he does not adhere to any specific diet Exercise: he does not exercise  He had his labs prior to his appt. Cholesterol was elevated. Total was 221, HDL 36, LDL not calculated due to Triglyceride of 463. He has been on cholesterol medication in the past but can not remember the name of it.  Review of Systems      Past Medical History  Diagnosis Date  . Anxiety   . Chicken pox   . Skin cancer of face     Current Outpatient Prescriptions  Medication Sig Dispense Refill  . cyclobenzaprine (FLEXERIL) 10 MG tablet Take 5 mg by mouth as needed for muscle spasms.    Marland Kitchen triamcinolone (NASACORT) 55 MCG/ACT AERO nasal inhaler Place 2 sprays into the nose daily.    . meloxicam (MOBIC) 15 MG tablet Take 15 mg by mouth daily as needed for pain.     No current facility-administered medications for this visit.    Allergies  Allergen Reactions  . Bee Venom Anaphylaxis    Family History  Problem Relation Age of Onset  . Heart disease Mother   . Diabetes Mother   . Heart disease Father   . Diabetes Father   . Cancer Neg Hx   . Stroke Neg Hx     History   Social History  . Marital Status: Married    Spouse Name: N/A    Number of Children: N/A  . Years of Education: N/A   Occupational History  . Not on file.   Social History Main Topics  . Smoking status: Never Smoker   . Smokeless tobacco: Current User    Types: Chew  . Alcohol Use: 0.0 oz/week    0 Not specified per week     Comment: occasional  . Drug Use: No  . Sexual Activity: Yes   Other Topics Concern  . Not on file   Social History Narrative     Constitutional: Denies fever, malaise, fatigue, headache or abrupt weight changes.  HEENT: Denies eye pain, eye redness, ear pain, ringing in  the ears, wax buildup, runny nose, nasal congestion, bloody nose, or sore throat. Respiratory: Denies difficulty breathing, shortness of breath, cough or sputum production.   Cardiovascular: Denies chest pain, chest tightness, palpitations or swelling in the hands or feet.  Gastrointestinal: Denies abdominal pain, bloating, constipation, diarrhea or blood in the stool.  GU: Denies urgency, frequency, pain with urination, burning sensation, blood in urine, odor or discharge. Musculoskeletal: Denies decrease in range of motion, difficulty with gait, muscle pain or joint pain and swelling.  Skin: Denies redness, rashes, lesions or ulcercations.  Neurological: Denies dizziness, difficulty with memory, difficulty with speech or problems with balance and coordination.  Psych: Pt reports anxiety. Denies depression, SI/HI.  No other specific complaints in a complete review of systems (except as listed in HPI above).  Objective:   Physical Exam  BP 118/76 mmHg  Pulse 99  Temp(Src) 99.3 F (37.4 C) (Oral)  Ht 5' 10.75" (1.797 m)  Wt 244 lb (110.678 kg)  BMI 34.27 kg/m2  SpO2 98% Wt Readings from Last 3 Encounters:  11/03/14 244 lb (110.678 kg)  09/15/14 245 lb (111.131 kg)  07/28/14 237 lb (107.502 kg)  General: Appears his stated age, obese but well developed, well nourished in NAD. Skin: Warm, dry and intact. No rashes, lesions or ulcerations noted. HEENT: Head: normal shape and size; Eyes: sclera white, no icterus, conjunctiva pink, PERRLA and EOMs intact; Ears: Tm's gray and intact, normal light reflex; Nose: mucosa pink and moist, septum midline; Throat/Mouth: Teeth present, mucosa pink and moist, no exudate, lesions or ulcerations noted.  Neck:  Neck supple, trachea midline. No masses, lumps or thyromegaly present.  Cardiovascular: Normal rate and rhythm. S1,S2 noted.  No murmur, rubs or gallops noted. No JVD or BLE edema. No carotid bruits noted. Pulmonary/Chest: Normal effort and  positive vesicular breath sounds. No respiratory distress. No wheezes, rales or ronchi noted.  Abdomen: Soft and nontender. Normal bowel sounds, no bruits noted. No distention or masses noted. Liver, spleen and kidneys non palpable. Musculoskeletal: Normal range of motion. Strength 5/5 BUE/BLE. No difficulty with gait.  Neurological: Alert and oriented. Cranial nerves II-XII grossly intact.  Psychiatric: Mood and affect normal. Behavior is normal. Judgment and thought content normal.     BMET    Component Value Date/Time   NA 140 10/21/2014 1426   NA 137 06/13/2014 2352   K 4.5 10/21/2014 1426   CL 102 10/21/2014 1426   CO2 20 10/21/2014 1426   GLUCOSE 145* 10/21/2014 1426   GLUCOSE 204* 06/13/2014 2352   BUN 17 10/21/2014 1426   BUN 16 06/13/2014 2352   CREATININE 0.84 10/21/2014 1426   CALCIUM 9.4 10/21/2014 1426   GFRNONAA 112 10/21/2014 1426   GFRAA 129 10/21/2014 1426    Lipid Panel     Component Value Date/Time   TRIG 463* 10/21/2014 1426   HDL 36* 10/21/2014 1426   CHOLHDL 6.1* 10/21/2014 1426   LDLCALC Comment 10/21/2014 1426    CBC    Component Value Date/Time   WBC 6.0 10/21/2014 1426   WBC 6.0 06/13/2014 2352   RBC 4.99 10/21/2014 1426   RBC 4.71 06/13/2014 2352   HGB 15.7 10/21/2014 1426   HCT 45.4 10/21/2014 1426   PLT 278 10/21/2014 1426   MCV 91 10/21/2014 1426   MCH 31.5 10/21/2014 1426   MCH 32.5 06/13/2014 2352   MCHC 34.6 10/21/2014 1426   MCHC 35.8 06/13/2014 2352   RDW 13.6 10/21/2014 1426   RDW 13.0 06/13/2014 2352   LYMPHSABS 2.6 10/21/2014 1426   LYMPHSABS 1.6 06/13/2014 2352   MONOABS 0.5 06/13/2014 2352   EOSABS 0.2 10/21/2014 1426   EOSABS 0.1 06/13/2014 2352   BASOSABS 0.1 10/21/2014 1426   BASOSABS 0.0 06/13/2014 2352    Hgb A1C No results found for: HGBA1C       Assessment & Plan:   Preventative Health Maintenance:  Labs reviewed Encouraged him to consume a low fat diet and take 1000 mg fish oil OTC daily for  HLD Encouraged him to work on diet and exercise All HM UTD  RTC in 3 months for lab only appt to recheck cholesterol

## 2014-11-03 NOTE — Patient Instructions (Signed)

## 2014-11-03 NOTE — Progress Notes (Signed)
Pre visit review using our clinic review tool, if applicable. No additional management support is needed unless otherwise documented below in the visit note. 

## 2015-02-02 ENCOUNTER — Other Ambulatory Visit (INDEPENDENT_AMBULATORY_CARE_PROVIDER_SITE_OTHER): Payer: 59

## 2015-02-02 DIAGNOSIS — E785 Hyperlipidemia, unspecified: Secondary | ICD-10-CM

## 2015-02-03 LAB — LIPID PANEL
Chol/HDL Ratio: 5 ratio units (ref 0.0–5.0)
Cholesterol, Total: 129 mg/dL (ref 100–199)
HDL: 26 mg/dL — ABNORMAL LOW (ref >39)
LDL Calculated: 58 mg/dL (ref 0–99)
Triglycerides: 225 mg/dL — ABNORMAL HIGH (ref 0–149)
VLDL Cholesterol Cal: 45 mg/dL — ABNORMAL HIGH (ref 5–40)

## 2015-02-18 ENCOUNTER — Encounter: Payer: Self-pay | Admitting: Internal Medicine

## 2015-02-18 ENCOUNTER — Ambulatory Visit (INDEPENDENT_AMBULATORY_CARE_PROVIDER_SITE_OTHER): Payer: 59 | Admitting: Internal Medicine

## 2015-02-18 VITALS — BP 114/72 | HR 102 | Temp 98.9°F | Wt 241.0 lb

## 2015-02-18 DIAGNOSIS — J0100 Acute maxillary sinusitis, unspecified: Secondary | ICD-10-CM

## 2015-02-18 MED ORDER — AMOXICILLIN-POT CLAVULANATE 875-125 MG PO TABS: 1.0000 | ORAL_TABLET | Freq: Two times a day (BID) | ORAL | Status: DC

## 2015-02-18 NOTE — Progress Notes (Signed)
Pre visit review using our clinic review tool, if applicable. No additional management support is needed unless otherwise documented below in the visit note. 

## 2015-02-18 NOTE — Progress Notes (Signed)
HPI  Pt presents to the clinic today with c/o facial pain and pressure, pain in his teeth, nasal congestion, sore throat and cough. He reports this started about 2 weeks ago but has gotten much worse over the last 4 days. He is blowing green mucous out of his nose. His cough is non productive. He has felt dizzy at times, had fever and chills. He has tried Ibuprofen and Nasocort without any relief. He reports a history of chronic sinus issues in the past. He has seen ENT in the past but not recently. He has had sick contacts. He does not smoke.  Review of Systems    Past Medical History  Diagnosis Date  . Anxiety   . Chicken pox   . Skin cancer of face     Family History  Problem Relation Age of Onset  . Heart disease Mother   . Diabetes Mother   . Heart disease Father   . Diabetes Father   . Cancer Neg Hx   . Stroke Neg Hx     History   Social History  . Marital Status: Married    Spouse Name: N/A  . Number of Children: N/A  . Years of Education: N/A   Occupational History  . Not on file.   Social History Main Topics  . Smoking status: Never Smoker   . Smokeless tobacco: Current User    Types: Chew  . Alcohol Use: 0.0 oz/week    0 Standard drinks or equivalent per week     Comment: occasional  . Drug Use: No  . Sexual Activity: Yes   Other Topics Concern  . Not on file   Social History Narrative    Allergies  Allergen Reactions  . Bee Venom Anaphylaxis     Constitutional: Positive headache, fatigue and fever. Denies abrupt weight changes.  HEENT:  Positive facial pain, nasal congestion and sore throat. Denies eye redness, ear pain, ringing in the ears, wax buildup, runny nose or bloody nose. Respiratory: Positive cough. Denies difficulty breathing or shortness of breath.  Cardiovascular: Denies chest pain, chest tightness, palpitations or swelling in the hands or feet.   No other specific complaints in a complete review of systems (except as listed in HPI  above).  Objective:  BP 114/72 mmHg  Pulse 102  Temp(Src) 98.9 F (37.2 C) (Oral)  Wt 241 lb (109.317 kg)  SpO2 98%   General: Appears his stated age, ill appearing in NAD. HEENT: Head: normal shape and size, maxillary sinus tenderness noted; Eyes: sclera white, no icterus, conjunctiva pink; Ears: Tm's gray and intact, normal light reflex; Nose: mucosa  Boggy and moist, septum midline; Throat/Mouth: + PND. Teeth present, mucosa pink and moist, no exudate noted, no lesions or ulcerations noted.  Neck: No adenopathy noted. Cardiovascular: Tachycardic with normal rhythm. S1,S2 noted.  No murmur, rubs or gallops noted.  Pulmonary/Chest: Normal effort and positive vesicular breath sounds. No respiratory distress. No wheezes, rales or ronchi noted.      Assessment & Plan:   Acute maxilarysinusitis  Can use a Neti Pot which can be purchased from your local drug store. Flonase 2 sprays each nostril for 3 days and then as needed. Augmentin BID for 10 days  RTC as needed or if symptoms persist.

## 2015-02-18 NOTE — Patient Instructions (Signed)

## 2015-03-22 ENCOUNTER — Ambulatory Visit
Admission: EM | Admit: 2015-03-22 | Discharge: 2015-03-22 | Disposition: A | Discharge status: Home / Self Care | Payer: 59 | Attending: Internal Medicine | Admitting: Internal Medicine

## 2015-03-22 DIAGNOSIS — R05 Cough: Secondary | ICD-10-CM

## 2015-03-22 DIAGNOSIS — J301 Allergic rhinitis due to pollen: Secondary | ICD-10-CM | POA: Insufficient documentation

## 2015-03-22 DIAGNOSIS — J302 Other seasonal allergic rhinitis: Secondary | ICD-10-CM

## 2015-03-22 DIAGNOSIS — R0981 Nasal congestion: Secondary | ICD-10-CM | POA: Diagnosis present

## 2015-03-22 DIAGNOSIS — J40 Bronchitis, not specified as acute or chronic: Secondary | ICD-10-CM | POA: Diagnosis not present

## 2015-03-22 DIAGNOSIS — Z79899 Other long term (current) drug therapy: Secondary | ICD-10-CM | POA: Diagnosis not present

## 2015-03-22 DIAGNOSIS — R059 Cough, unspecified: Secondary | ICD-10-CM

## 2015-03-22 DIAGNOSIS — R509 Fever, unspecified: Secondary | ICD-10-CM | POA: Diagnosis present

## 2015-03-22 LAB — RAPID INFLUENZA A&B ANTIGENS
Influenza A (ARMC): NOT DETECTED
Influenza B (ARMC): NOT DETECTED

## 2015-03-22 MED ORDER — BENZONATATE 100 MG PO CAPS: 100.0000 mg | ORAL_CAPSULE | Freq: Three times a day (TID) | ORAL | Status: DC

## 2015-03-22 MED ORDER — AZITHROMYCIN 250 MG PO TABS: 250.0000 mg | ORAL_TABLET | Freq: Every day | ORAL | Status: DC

## 2015-03-22 MED ORDER — ALBUTEROL SULFATE HFA 108 (90 BASE) MCG/ACT IN AERS: 1.0000 | INHALATION_SPRAY | Freq: Four times a day (QID) | RESPIRATORY_TRACT | Status: DC | PRN

## 2015-03-22 MED ORDER — IPRATROPIUM-ALBUTEROL 0.5-2.5 (3) MG/3ML IN SOLN
3.0000 mL | Freq: Once | RESPIRATORY_TRACT | Status: AC
  Administered 2015-03-22: 3 mL via RESPIRATORY_TRACT

## 2015-03-22 NOTE — Discharge Instructions (Signed)
Discard all partial antibiotics left-over dosages you have at home. Please plan to take full Rx when ordered and report back if continued difficulty. You have currently had a partial course of antibiotic and screening cannot be adequately performed. I have ordered a new course of Azithromycin- please take 2 on day one and then one on days 2-3-4-5. Use cough rx Benzonatate as needed at work. Guaifenesin DM to help suppress cough and to liquefy mucous- increase fluids by mouth , steamy showers - vaporizer if available. Use a mask when doing yard work- take your previously ordered [employer] and [employer]. Thank you for choosing [place] for your care today ! I hope you are feeling much better soon.

## 2015-03-22 NOTE — ED Notes (Signed)
sgtarted feeling sick a few days ago. With cough congestion, drainage, tightness in chest and fever.

## 2015-03-22 NOTE — ED Provider Notes (Addendum)
CSN: [id number]     Arrival date & time 03/22/15  1318 History   First MD Initiated Contact with Patient 03/22/15 1341     Chief Complaint  Patient presents with  . Cough  . Fever  . Nasal Congestion   (Consider location/radiation/quality/duration/timing/severity/associated sxs/prior Treatment) Patient is a 39 y.o. male presenting with cough and fever. The history is provided by the patient.  Cough Cough characteristics:  Harsh Severity:  Mild Onset quality:  Gradual Duration:  5 days Timing:  Intermittent Progression:  Waxing and waning Relieved by:  Fluids Associated symptoms: chills, ear pain and fever   Fever Associated symptoms: chills, congestion, cough and ear pain    Started Amoxicillin 2 days ago- 500 mg BID- leftover from his son's Rx- not enough  for 10 days... Was on his own Rx for Augmentin last month and took full Rx  Past Medical History  Diagnosis Date  . Anxiety   . Chicken pox   . Skin cancer of face    Past Surgical History  Procedure Laterality Date  . Back surgery     Family History  Problem Relation Age of Onset  . Heart disease Mother   . Diabetes Mother   . Heart disease Father   . Diabetes Father   . Cancer Neg Hx   . Stroke Neg Hx    History  Substance Use Topics  . Smoking status: Never Smoker   . Smokeless tobacco: Current User    Types: Chew  . Alcohol Use: 0.0 oz/week    0 Standard drinks or equivalent per week     Comment: occasional    Review of Systems  Constitutional: Positive for fever, chills and fatigue.  HENT: Positive for congestion, ear discharge, ear pain and postnasal drip.   Eyes: Negative.   Respiratory: Positive for cough.   Cardiovascular: Negative.   Gastrointestinal: Negative.   Endocrine: Negative.   Skin: Negative.   Allergic/Immunologic: Positive for environmental allergies.  Neurological: Negative.   Hematological: Negative.   Psychiatric/Behavioral: Negative.   left ear drained clear yesterday and  pain gone now   Bee venom  Home Medications   Prior to Admission medications   Medication Sig Start Date End Date Taking? Authorizing Provider  albuterol (PROVENTIL HFA;VENTOLIN HFA) 108 (90 BASE) MCG/ACT inhaler Inhale 1-2 puffs into the lungs every 6 (six) hours as needed for wheezing or shortness of breath. 03/22/15   Jan Fireman, PA-C  azithromycin (ZITHROMAX) 250 MG tablet Take 1 tablet (250 mg total) by mouth daily. Take first 2 tablets together, then 1 every day until finished. 03/22/15   Jan Fireman, PA-C  benzonatate (TESSALON) 100 MG capsule Take 1 capsule (100 mg total) by mouth every 8 (eight) hours. 03/22/15   Jan Fireman, PA-C  cyclobenzaprine (FLEXERIL) 10 MG tablet Take 5 mg by mouth as needed for muscle spasms.    Historical Provider, MD  meloxicam (MOBIC) 15 MG tablet Take 15 mg by mouth daily as needed for pain.    Historical Provider, MD  triamcinolone (NASACORT) 55 MCG/ACT AERO nasal inhaler Place 2 sprays into the nose daily.    Historical Provider, MD   BP 133/87 mmHg  Pulse 112  Temp(Src) 98.4 F (36.9 C) (Tympanic)  Ht 6' (1.829 m)  Wt 240 lb (108.863 kg)  BMI 32.54 kg/m2  SpO2 95% Physical Exam  HENT:  Head: Atraumatic.  Right Ear: Tympanic membrane is not injected and not perforated. No hemotympanum.  Left Ear: Tympanic  membrane is not injected and not perforated. No hemotympanum.  Eyes: EOM are normal.  Neck: Neck supple.  Cardiovascular: Tachycardia present.   Took ibuprofen 400 mg  one hour before arrival because of fever 102 - arrival 98.4  Pulmonary/Chest: Breath sounds normal. No respiratory distress. He has no wheezes.  Abdominal: Soft.  Musculoskeletal: Normal range of motion.  Lymphadenopathy:    He has no cervical adenopathy.  Neurological: He is alert.  Skin: Skin is dry.  Nursing note and vitals reviewed.  Cobblestoning present pharynx- flu swab done for malaise and tachy- negative  Nasal mucos boggy   Has repetitive non-productive cough   ED Course  Procedures (including critical care time)  DuoNeb -patient felt improved following therapy  Labs Review Labs Reviewed  INFLUENZA A&B ANTIGENS([facility])   Results for orders placed or performed during the hospital encounter of 03/22/15  Influenza A&B Antigens [facility])  Result Value Ref Range   Influenza A [facility]) NOT DETECTED    Influenza B [facility]) NOT DETECTED    Imaging Review No results found.   MDM   1. Bronchitis   2. Cough   3. Seasonal allergies           Jan Fireman, PA-C 03/27/15 1520

## 2015-04-04 ENCOUNTER — Encounter: Payer: Self-pay | Admitting: *Deleted

## 2015-04-04 ENCOUNTER — Emergency Department
Admission: EM | Admit: 2015-04-04 | Discharge: 2015-04-04 | Disposition: A | Discharge status: Home / Self Care | Payer: Worker's Compensation | Attending: Emergency Medicine | Admitting: Emergency Medicine

## 2015-04-04 DIAGNOSIS — S61032A Puncture wound without foreign body of left thumb without damage to nail, initial encounter: Secondary | ICD-10-CM | POA: Diagnosis not present

## 2015-04-04 DIAGNOSIS — Z7721 Contact with and (suspected) exposure to potentially hazardous body fluids: Secondary | ICD-10-CM | POA: Diagnosis not present

## 2015-04-04 DIAGNOSIS — Z79899 Other long term (current) drug therapy: Secondary | ICD-10-CM | POA: Insufficient documentation

## 2015-04-04 DIAGNOSIS — Y9289 Other specified places as the place of occurrence of the external cause: Secondary | ICD-10-CM | POA: Insufficient documentation

## 2015-04-04 DIAGNOSIS — Z792 Long term (current) use of antibiotics: Secondary | ICD-10-CM | POA: Insufficient documentation

## 2015-04-04 DIAGNOSIS — W461XXA Contact with contaminated hypodermic needle, initial encounter: Secondary | ICD-10-CM | POA: Diagnosis not present

## 2015-04-04 DIAGNOSIS — Z578 Occupational exposure to other risk factors: Secondary | ICD-10-CM

## 2015-04-04 DIAGNOSIS — Y9389 Activity, other specified: Secondary | ICD-10-CM | POA: Insufficient documentation

## 2015-04-04 DIAGNOSIS — Y998 Other external cause status: Secondary | ICD-10-CM | POA: Diagnosis not present

## 2015-04-04 NOTE — ED Notes (Signed)
Williams, MD at bedside. °

## 2015-04-04 NOTE — ED Notes (Signed)
Pt reports to ED with c/o exposure to body fluids at his job at [employer] reports being stuck by a "vaginal wet prep mount pouch" at 120 am this morning. Pt is unsure of his Hep B immunization status. Pt reports being stuck on the thumb of his left hand. Pt reports a small amount of bleeding after the incident; no bleeding, swelling or redness noted at this time.

## 2015-04-04 NOTE — ED Notes (Signed)
Pt stuck through glove w/ vaginal swab wire while at work.

## 2015-04-04 NOTE — ED Provider Notes (Signed)
[facility] Emergency Department Provider Note     Time seen: 05 30  I have reviewed the triage vital signs and the nursing notes.   HISTORY  Chief Complaint Body Fluid Exposure    HPI Joshua Bean is a 39 y.o. male who presents ED after exposure to bodily fluids at his job at [employer]. Patient states she was stuck by a vaginal wet prep mount pouch at 1:20 AM this morning. Patient is unsure of his hep B immunization status. He has no pain from the needle stick in his left thumb. Denies any other complaints.   Past Medical History  Diagnosis Date  . Anxiety   . Chicken pox   . Skin cancer of face     Patient Active Problem List   Diagnosis Date Noted  . HLD (hyperlipidemia) 11/03/2014  . Obesity (BMI 30-39.9) 07/28/2014  . Generalized anxiety disorder 07/28/2014    Past Surgical History  Procedure Laterality Date  . Back surgery      Current Outpatient Rx  Name  Route  Sig  Dispense  Refill  . albuterol (PROVENTIL HFA;VENTOLIN HFA) 108 (90 BASE) MCG/ACT inhaler   Inhalation   Inhale 1-2 puffs into the lungs every 6 (six) hours as needed for wheezing or shortness of breath.   1 Inhaler   0   . azithromycin (ZITHROMAX) 250 MG tablet   Oral   Take 1 tablet (250 mg total) by mouth daily. Take first 2 tablets together, then 1 every day until finished.   6 tablet   0   . benzonatate (TESSALON) 100 MG capsule   Oral   Take 1 capsule (100 mg total) by mouth every 8 (eight) hours.   21 capsule   0   . cyclobenzaprine (FLEXERIL) 10 MG tablet   Oral   Take 5 mg by mouth as needed for muscle spasms.         . meloxicam (MOBIC) 15 MG tablet   Oral   Take 15 mg by mouth daily as needed for pain.         Marland Kitchen triamcinolone (NASACORT) 55 MCG/ACT AERO nasal inhaler   Nasal   Place 2 sprays into the nose daily.           Allergies Bee venom  Family History  Problem Relation Age of Onset  . Heart disease Mother   . Diabetes Mother    . Heart disease Father   . Diabetes Father   . Cancer Neg Hx   . Stroke Neg Hx     Social History History  Substance Use Topics  . Smoking status: Never Smoker   . Smokeless tobacco: Current User    Types: Chew  . Alcohol Use: 0.0 oz/week    0 Standard drinks or equivalent per week     Comment: occasional    Review of Systems Constitutional: Negative for fever. Eyes: Negative for visual changes. ENT: Negative for sore throat. Cardiovascular: Negative for chest pain. Respiratory: Negative for shortness of breath. Gastrointestinal: Negative for abdominal pain, vomiting and diarrhea. Genitourinary: Negative for dysuria. Musculoskeletal: Negative for back pain. Skin: Puncture wound Neurological: Negative for headaches, focal weakness or numbness.  10-point ROS otherwise negative.  ____________________________________________   PHYSICAL EXAM:  VITAL SIGNS: ED Triage Vitals  Enc Vitals Group     BP 04/04/15 0355 121/87 mmHg     Pulse Rate 04/04/15 0355 90     Resp 04/04/15 0355 16  Temp 04/04/15 0355 98.1 F (36.7 C)     Temp Source 04/04/15 0355 Oral     SpO2 04/04/15 0355 99 %     Weight 04/04/15 0355 244 lb (110.678 kg)     Height 04/04/15 0355 6' (1.829 m)     Head Cir --      Peak Flow --      Pain Score 04/04/15 0359 0     Pain Loc --      Pain Edu? --      Excl. in [place]? --     Constitutional: Alert and oriented. Well appearing and in no distress. Eyes: Conjunctivae are normal. PERRL. Normal extraocular movements. ENT   Head: Normocephalic and atraumatic.   Nose: No congestion/rhinnorhea.   Mouth/Throat: Mucous membranes are moist.   Neck: No stridor. Hematological/Lymphatic/Immunilogical: No cervical lymphadenopathy. Cardiovascular: Normal rate, regular rhythm. Normal and symmetric distal pulses are present in all extremities. No murmurs, rubs, or gallops. Respiratory: Normal respiratory effort without tachypnea nor retractions.  Breath sounds are clear and equal bilaterally. No wheezes/rales/rhonchi. Gastrointestinal: Soft and nontender. No distention. No abdominal bruits. There is no CVA tenderness. Musculoskeletal: Nontender with normal range of motion in all extremities. No joint effusions.  No lower extremity tenderness nor edema. Neurologic:  Normal speech and language. No gross focal neurologic deficits are appreciated. Speech is normal. No gait instability. Skin:  Skin is warm, small puncture wound noted to the base left thumb Psychiatric: Mood and affect are normal. Speech and behavior are normal. Patient exhibits appropriate insight and judgment.  ____________________________________________    LABS (pertinent positives/negatives)  Labs Reviewed - No data to display  ____________________________________________  ED COURSE:  Pertinent labs & imaging results that were available during my care of the patient were reviewed by me and considered in my medical decision making (see chart for details).   ____________________________________________   RADIOLOGY  None  ____________________________________________    FINAL ASSESSMENT AND PLAN  By way fluid exposure and puncture wound next  Plan: Patient with remarkably low exposure risk. Advised against testing or post exposure prophylaxis. Advised testing for the source patient however, return for worsening or worrisome symptoms    Earleen Newport, MD   Earleen Newport, MD 04/04/15 [id number]

## 2015-04-04 NOTE — Discharge Instructions (Signed)
Body Fluid Exposure Information °People may come into contact with blood and other body fluids under various circumstances. In some cases, body fluids may contain germs (bacteria or viruses) that cause infections. These germs can be spread when another person's body fluids come into contact with your skin, mouth, eyes, or genitals.  °Exposure to body fluids that may contain infectious material is a common problem for people providing care for others who are ill. It can occur when a person is performing health care tasks in the workplace or when taking care of a family member at home. Other common methods of exposure include injection drug use, sharing needles, and sexual activity. °The risk of an infection spreading through body fluid exposure is small and depends on a variety of factors. This includes the type of body fluid, the nature of the exposure, and the health status of the person who was the source of the body fluids. Your health care provider can help you assess the risk. °WHAT TYPES OF BODY FLUID CAN SPREAD INFECTION? °The following types of body fluid have the potential to spread infections: °· Blood. °· Semen. °· Vaginal secretions. °· Urine. °· Feces. °· Saliva. °· Nasal or eye discharge. °· Breast milk. °· Amniotic fluid and fluids surrounding body organs. °WHAT ARE SOME FIRST-AID MEASURES FOR BODY FLUID EXPOSURE? °The following steps should be taken as soon as possible after a person is exposed to body fluids: °Intact Skin °· For contact with closed skin, wash the area with soap and water. °Broken Skin °· For contact with broken skin (a wound), wash the area with soap and water. Let the area bleed a little. Then place a bandage or clean towel on the wound, applying gentle pressure to stop the bleeding. Do not squeeze or rub the area. °· Use just water or hand sanitizer if a sink with soap is not available. °· Do not use harsh chemicals such as bleach or iodine. °Eyes °· Rinse the eyes with water or  saline for 30 seconds. °· If the person is wearing contact lenses, leave the contact lenses in while rinsing the eyes. Once the rinsing is complete, remove the contact lenses. °Mouth °· Spit out the fluids. Rinse and spit with water 4-5 times. °In addition, you should remove any clothing that comes into contact with body fluids. However, if body fluid exposure results from sexual assault, seek medical care immediately without changing clothes or bathing. °WHEN SHOULD YOU SEEK HELP? °After performing the proper first-aid steps, you should contact your health care provider or seek emergency care right away if blood or other body fluids made contact with areas of broken skin or openings such as the eyes or mouth. If the exposure to body fluid happened in the workplace, you should report it to your work supervisor immediately. Many workplaces have procedures in place for exposure situations. °WHAT WILL HAPPEN AFTER YOU REPORT THE EXPOSURE? °Your health care provider will ask you several questions. Information requested may include: °· Your medical history, including vaccination records. °· Date and time of the exposure. °· Whether you saw body fluids during the exposure. °· Type of body fluid you were exposed to. °· Volume of body fluid you were exposed to. °· How the exposure happened. °· If any devices, such as a needle, were being used. °· Which area of your body made contact with the body fluid. °· Description of any injury to the skin or other area. °· How long contact was made with the body   fluid. °· Any information you have about the health status of the person whose body fluid you were exposed to. °The health care provider will assess your risk of infection. Often, no treatment is necessary. In some cases, the health care provider may recommend doing blood tests right away. Follow-up blood tests may also be done at certain intervals during the upcoming weeks and months to check for changes. You may be offered  treatment to prevent an infection from developing after exposure (post-exposure prophylaxis). This may include certain vaccinations or medicines and may be necessary when there is a risk of a serious infection, such as HIV or hepatitis B. Your health care provider should discuss appropriate treatment and vaccinations with you. °HOW CAN YOU PREVENT EXPOSURE AND INFECTION? °Always remember that prevention is the first line of defense against body fluid exposure. To help prevent exposure to body fluids: °· Wash and disinfect countertops and other surfaces regularly. °· Wear appropriate protective gear such as gloves, gowns, or eyewear when the possibility of exposure is present. °· Wipe away spills of body fluid with disposable towels. °· Properly dispose of blood products and other fluids. Use secured bags. °· Properly dispose of needles and other instruments with sharp points or edges (sharps). Use closed, marked containers. °· Avoid injection drug use. °· Do not share needles. °· Avoid recapping needles. °· Use a condom during sexual intercourse. °· Make sure you learn and follow any guidelines for preventing exposure (universal precautions) provided at your workplace. °To help reduce your chances of getting an infection: °· Make sure your vaccinations are up-to-date, including those for tetanus and hepatitis. °· Wash your hands frequently with soap and water. Use hand sanitizers. °· Avoid having multiple sex partners. °· Follow up with your health care provider as directed after being evaluated for an exposure to body fluids. °To avoid spreading infection to others: °· Do not have sexual relations until you know you are free of infection. °· Do not donate blood, plasma, breast milk, sperm, or other body fluids. °· Do not share hygiene tools such as toothbrushes, razors, or dental floss. °· Keep open wounds covered. °· Dispose of any items with blood on them (razors, tampons, bandages) by putting them in the  trash. °· Do not share drug supplies with others, such as needles, syringes, straws, or pipes. °· Follow all of your health care provider's instructions for preventing the spread of infection. °Document Released: 07/03/2013 Document Revised: 11/05/2013 Document Reviewed: 07/03/2013 °ExitCare® Patient Information ©2015 [employer]. This information is not intended to replace advice given to you by your health care provider. Make sure you discuss any questions you have with your health care provider. ° °

## 2015-04-16 ENCOUNTER — Telehealth: Payer: Self-pay | Admitting: Internal Medicine

## 2015-04-16 ENCOUNTER — Encounter: Payer: Self-pay | Admitting: Internal Medicine

## 2015-04-16 ENCOUNTER — Ambulatory Visit (INDEPENDENT_AMBULATORY_CARE_PROVIDER_SITE_OTHER): Payer: 59 | Admitting: Internal Medicine

## 2015-04-16 VITALS — BP 120/84 | HR 100 | Temp 98.7°F | Wt 237.0 lb

## 2015-04-16 DIAGNOSIS — L259 Unspecified contact dermatitis, unspecified cause: Secondary | ICD-10-CM | POA: Diagnosis not present

## 2015-04-16 NOTE — Progress Notes (Signed)
Subjective:    Patient ID: Joshua Bean, male    DOB: November 27, 1975, 39 y.o.   MRN: [id number]  HPI  Pt presents to the clinic today with c/o a rash. This started yesterday. He did spray a bug spray on him that he had never used before and he thinks this may have caused it. The rash is on his arm and legs. It is very itchy. He has taken Benadryl with some relief.  Review of Systems      Past Medical History  Diagnosis Date  . Anxiety   . Chicken pox   . Skin cancer of face     Current Outpatient Prescriptions  Medication Sig Dispense Refill  . albuterol (PROVENTIL HFA;VENTOLIN HFA) 108 (90 BASE) MCG/ACT inhaler Inhale 1-2 puffs into the lungs every 6 (six) hours as needed for wheezing or shortness of breath. 1 Inhaler 0  . cyclobenzaprine (FLEXERIL) 10 MG tablet Take 5 mg by mouth as needed for muscle spasms.    . meloxicam (MOBIC) 15 MG tablet Take 15 mg by mouth daily as needed for pain.    Marland Kitchen triamcinolone (NASACORT) 55 MCG/ACT AERO nasal inhaler Place 2 sprays into the nose daily.     No current facility-administered medications for this visit.    Allergies  Allergen Reactions  . Bee Venom Anaphylaxis    Family History  Problem Relation Age of Onset  . Heart disease Mother   . Diabetes Mother   . Heart disease Father   . Diabetes Father   . Cancer Neg Hx   . Stroke Neg Hx     History   Social History  . Marital Status: Married    Spouse Name: N/A  . Number of Children: N/A  . Years of Education: N/A   Occupational History  . Not on file.   Social History Main Topics  . Smoking status: Never Smoker   . Smokeless tobacco: Current User    Types: Chew  . Alcohol Use: 0.0 oz/week    0 Standard drinks or equivalent per week     Comment: occasional  . Drug Use: No  . Sexual Activity: Yes   Other Topics Concern  . Not on file   Social History Narrative     Constitutional: Denies fever, malaise, fatigue, headache or abrupt weight changes.    Respiratory: Denies difficulty breathing, shortness of breath, cough or sputum production.   Cardiovascular: Denies chest pain, chest tightness, palpitations or swelling in the hands or feet.  Skin: Pt reports rash. Denies, lesions or ulcercations.    No other specific complaints in a complete review of systems (except as listed in HPI above).  Objective:   Physical Exam   BP 120/84 mmHg  Pulse 100  Temp(Src) 98.7 F (37.1 C) (Oral)  Wt 237 lb (107.502 kg)  SpO2 99% Wt Readings from Last 3 Encounters:  04/16/15 237 lb (107.502 kg)  04/04/15 244 lb (110.678 kg)  03/22/15 240 lb (108.863 kg)    General: Appears his stated age, well developed, well nourished in NAD. Skin: Fine maculopapular rash noted on bilateral arms and legs Cardiovascular: Normal rate and rhythm. S1,S2 noted.  No murmur, rubs or gallops noted.  Pulmonary/Chest: Normal effort and positive vesicular breath sounds. No respiratory distress. No wheezes, rales or ronchi noted.    BMET    Component Value Date/Time   NA 140 10/21/2014 1426   NA 137 06/13/2014 2352   K 4.5 10/21/2014 1426   CL 102  10/21/2014 1426   CO2 20 10/21/2014 1426   GLUCOSE 145* 10/21/2014 1426   GLUCOSE 204* 06/13/2014 2352   BUN 17 10/21/2014 1426   BUN 16 06/13/2014 2352   CREATININE 0.84 10/21/2014 1426   CALCIUM 9.4 10/21/2014 1426   GFRNONAA 112 10/21/2014 1426   GFRAA 129 10/21/2014 1426    Lipid Panel     Component Value Date/Time   CHOL 129 02/02/2015 1412   TRIG 225* 02/02/2015 1412   HDL 26* 02/02/2015 1412   CHOLHDL 5.0 02/02/2015 1412   LDLCALC 58 02/02/2015 1412    CBC    Component Value Date/Time   WBC 6.0 10/21/2014 1426   WBC 6.0 06/13/2014 2352   RBC 4.99 10/21/2014 1426   RBC 4.71 06/13/2014 2352   HGB 15.7 10/21/2014 1426   HCT 45.4 10/21/2014 1426   PLT 278 10/21/2014 1426   MCV 91 10/21/2014 1426   MCH 31.5 10/21/2014 1426   MCH 32.5 06/13/2014 2352   MCHC 34.6 10/21/2014 1426   MCHC 35.8  06/13/2014 2352   RDW 13.6 10/21/2014 1426   RDW 13.0 06/13/2014 2352   LYMPHSABS 2.6 10/21/2014 1426   LYMPHSABS 1.6 06/13/2014 2352   MONOABS 0.5 06/13/2014 2352   EOSABS 0.2 10/21/2014 1426   EOSABS 0.1 06/13/2014 2352   BASOSABS 0.1 10/21/2014 1426   BASOSABS 0.0 06/13/2014 2352    Hgb A1C No results found for: HGBA1C      Assessment & Plan:   Contact Dermatitis:  80 mg Depo IM today Continue Benadryl as needed  RTC as needed or if symptoms persists or worsen

## 2015-04-16 NOTE — Telephone Encounter (Signed)
Pt has appt 04/16/15 at 4:15 with Webb Silversmith NP.

## 2015-04-16 NOTE — Progress Notes (Signed)
Pre visit review using our clinic review tool, if applicable. No additional management support is needed unless otherwise documented below in the visit note. 

## 2015-04-16 NOTE — Telephone Encounter (Signed)
[facility]  Patient Name: Joshua Bean  Gender: Male  DOB: 22-Feb-1976   Age: 39 Y 4 M 24 D  Return Phone Number: [phone] (Primary)  Address:   City/State/Zip: [place] [address]   Client [facility] - Client  Client Site [facility] - Day  Physician Baity, [place] Type Call  Call Type Triage / Clinical  Relationship To Patient Self  Appointment Disposition EMR Appointment Scheduled  Info pasted into Epic Yes  Return Phone Number [phone] (Primary)  Chief Complaint Allergic reaction (unknown symptoms)  Initial Comment Caller states, seems to be having an allergic reaction to something which started yesterday, itchy bumps on arms, legs and head. Not on torso. Benadryl calmed it down yesterday. But it has flared up again today.   PreDisposition Call Doctor   Nurse Assessment  Nurse: Buck Mam, RN, Trish Date/Time Eilene Ghazi Time): 04/16/2015 10:08:13 AM  Confirm and document reason for call. If symptomatic, describe symptoms. ---Patient is calling for self and caller states, seems to be having an allergic reaction to something which started yesterday, itchy bumps on arms, legs and head. Not on torso. Benadryl calmed it down yesterday. But it has flared up again today. This started yesterday and is not going away. They look like chill bumps no white heads small pin point raised. Warm to touch no temp. The only thing that was new was bug spray He had a shower last night to was off. No new medication. Was stuck at work by a lab specimen.  Has the patient traveled out of the country within the last 30 days? ---No  Does the patient require triage? ---Yes  Related visit to physician within the last 2 weeks? ---No  Does the PT have any chronic conditions? (i.e. diabetes, asthma, etc.) ---No     Guidelines      Guideline Title Affirmed  Question Affirmed Notes Nurse Date/Time (Eastern Time)  Rash or Redness - Widespread Severe itching  Buck Mam, RN, Trish 04/16/2015 10:15:29 AM   Disp. Time Eilene Ghazi Time) Disposition Final User          04/16/2015 10:26:39 AM See Physician within 24 Hours Yes Buck Mam, RN, [employer] farm manager Understands: Yes  Disagree/Comply: Comply     Care Advice Given Per Guideline      SEE PHYSICIAN WITHIN 24 HOURS: BENADRYL FOR ITCHING: Take Benadryl (OTC diphenhydramine) 4 times per day until seen. Adult dose is 25-50 mg PO. HYDROCORTISONE CREAM: * For very itchy spots, apply hydrocortisone cream 4 times a day for 5 days. * Available OTC in [place] as 0.5% and 1% cream. CAUTION - ANTIHISTAMINES: * Examples include diphenhydramine (Benadryl) and chlorpheniramine (Chlortrimeton, Chlor-tripolon) OATMEAL AVEENO BATH FOR ITCHING - Sprinkle contents of one Aveeno packet under running faucet with comfortably warm water. Bathe for 15 - 20 minutes, 1-2 times daily. Pat dry using towel - do not rub. CARE ADVICE given per Rash - Widespread and Cause Unknown (Adult) guideline. CALL BACK IF: * Rash becomes purple or blood-colored or blister-like * Fever occurs * You become worse.   After Care Instructions Given     Call Event Type User Date / Time Description        Comments  User: Rubie Maid, RN Date/Time Eilene Ghazi Time): 04/16/2015 10:30:06 AM  Betamethasone dipropionate (augmented) cream is used for: Reducing itching, redness, and  swelling associated with many skin conditions. Betamethasone dipropionate (augmented) cream is a topical corticosteroid. It works by depressing the formation, release, and activity of different cells and chemicals that cause swelling, redness, and itching. drugs.com patient has this cream at home and is perscribed to him advised he can use this as long as it is perscribed to him.   Referrals  REFERRED TO PCP OFFICE

## 2015-04-16 NOTE — Patient Instructions (Signed)

## 2015-06-04 ENCOUNTER — Encounter: Payer: Self-pay | Admitting: Internal Medicine

## 2015-06-04 ENCOUNTER — Ambulatory Visit (INDEPENDENT_AMBULATORY_CARE_PROVIDER_SITE_OTHER): Payer: 59 | Admitting: Internal Medicine

## 2015-06-04 VITALS — BP 146/82 | HR 97 | Temp 98.6°F | Wt 235.5 lb

## 2015-06-04 DIAGNOSIS — F411 Generalized anxiety disorder: Secondary | ICD-10-CM

## 2015-06-04 MED ORDER — ALPRAZOLAM 0.5 MG PO TABS: 0.5000 mg | ORAL_TABLET | Freq: Every day | ORAL | Status: DC | PRN

## 2015-06-04 MED ORDER — CITALOPRAM HYDROBROMIDE 10 MG PO TABS: 10.0000 mg | ORAL_TABLET | Freq: Every day | ORAL | Status: DC

## 2015-06-04 NOTE — Progress Notes (Signed)
Subjective:    Patient ID: Joshua Bean, male    DOB: 05/10/1976, 39 y.o.   MRN: [id number]  HPI  Pt presents to the clinic today to discuss anxiety and panic attacks. He reports this started 2-3 weeks ago. He is having marital problems. He is concerned that his wife may be cheating on him. She will not communicate with him about his concerns. He feels restless, irritated and agitated. He has had racing thoughts. He can't sleep at night. He has had panic attacks daily in which he feels like he can't breath, he gets very dizzy. He paces to try to distract himself. Alcohol helps. He had decided that he was going to leave his wife until his son asked him not to leave. He reports "this tears me apart inside". He denies SI/HI. He has an appt with Dr. Koleen Nimrod and a counselor in [place]. He would like to be referred to someone closer to this area.   Review of Systems      Past Medical History  Diagnosis Date  . Anxiety   . Chicken pox   . Skin cancer of face     Current Outpatient Prescriptions  Medication Sig Dispense Refill  . albuterol (PROVENTIL HFA;VENTOLIN HFA) 108 (90 BASE) MCG/ACT inhaler Inhale 1-2 puffs into the lungs every 6 (six) hours as needed for wheezing or shortness of breath. 1 Inhaler 0  . cyclobenzaprine (FLEXERIL) 10 MG tablet Take 5 mg by mouth as needed for muscle spasms.    . meloxicam (MOBIC) 15 MG tablet Take 15 mg by mouth daily as needed for pain.    Marland Kitchen triamcinolone (NASACORT) 55 MCG/ACT AERO nasal inhaler Place 2 sprays into the nose daily.     No current facility-administered medications for this visit.    Allergies  Allergen Reactions  . Bee Venom Anaphylaxis    Family History  Problem Relation Age of Onset  . Heart disease Mother   . Diabetes Mother   . Heart disease Father   . Diabetes Father   . Cancer Neg Hx   . Stroke Neg Hx     History   Social History  . Marital Status: Married    Spouse Name: N/A  . Number of Children: N/A    . Years of Education: N/A   Occupational History  . Not on file.   Social History Main Topics  . Smoking status: Never Smoker   . Smokeless tobacco: Current User    Types: Chew  . Alcohol Use: 0.0 oz/week    0 Standard drinks or equivalent per week     Comment: occasional  . Drug Use: No  . Sexual Activity: Yes   Other Topics Concern  . Not on file   Social History Narrative     Constitutional: Denies fever, malaise, fatigue, headache or abrupt weight changes.  Respiratory: Denies difficulty breathing, shortness of breath, cough or sputum production.   Cardiovascular: Denies chest pain, chest tightness, palpitations or swelling in the hands or feet.  Neurological: Denies dizziness, difficulty with memory, difficulty with speech or problems with balance and coordination.  Psych: Pt reports anxiety. Denies depression, SI/HI.  No other specific complaints in a complete review of systems (except as listed in HPI above).  Objective:   Physical Exam   BP 146/82 mmHg  Pulse 97  Temp(Src) 98.6 F (37 C) (Oral)  Wt 235 lb 8 oz (106.822 kg)  SpO2 98% Wt Readings from Last 3 Encounters:  06/04/15 235  lb 8 oz (106.822 kg)  04/16/15 237 lb (107.502 kg)  04/04/15 244 lb (110.678 kg)    General: Appears his stated age, obese in NAD. Cardiovascular: Normal rate and rhythm. S1,S2 noted.  No murmur, rubs or gallops noted.  Pulmonary/Chest: Normal effort and positive vesicular breath sounds. No respiratory distress. No wheezes, rales or ronchi noted.  Neurological: Alert and oriented. Psychiatric: He is obviously anxious and tearful. He can not sit still. He is visibly shaking. He can contract for safety.  BMET    Component Value Date/Time   NA 140 10/21/2014 1426   NA 137 06/13/2014 2352   K 4.5 10/21/2014 1426   CL 102 10/21/2014 1426   CO2 20 10/21/2014 1426   GLUCOSE 145* 10/21/2014 1426   GLUCOSE 204* 06/13/2014 2352   BUN 17 10/21/2014 1426   BUN 16 06/13/2014 2352    CREATININE 0.84 10/21/2014 1426   CALCIUM 9.4 10/21/2014 1426   GFRNONAA 112 10/21/2014 1426   GFRAA 129 10/21/2014 1426    Lipid Panel     Component Value Date/Time   CHOL 129 02/02/2015 1412   TRIG 225* 02/02/2015 1412   HDL 26* 02/02/2015 1412   CHOLHDL 5.0 02/02/2015 1412   LDLCALC 58 02/02/2015 1412    CBC    Component Value Date/Time   WBC 6.0 10/21/2014 1426   WBC 6.0 06/13/2014 2352   RBC 4.99 10/21/2014 1426   RBC 4.71 06/13/2014 2352   HGB 15.7 10/21/2014 1426   HCT 45.4 10/21/2014 1426   PLT 278 10/21/2014 1426   MCV 91 10/21/2014 1426   MCH 31.5 10/21/2014 1426   MCH 32.5 06/13/2014 2352   MCHC 34.6 10/21/2014 1426   MCHC 35.8 06/13/2014 2352   RDW 13.6 10/21/2014 1426   RDW 13.0 06/13/2014 2352   LYMPHSABS 2.6 10/21/2014 1426   LYMPHSABS 1.6 06/13/2014 2352   MONOABS 0.5 06/13/2014 2352   EOSABS 0.2 10/21/2014 1426   EOSABS 0.1 06/13/2014 2352   BASOSABS 0.1 10/21/2014 1426   BASOSABS 0.0 06/13/2014 2352    Hgb A1C No results found for: HGBA1C      Assessment & Plan:

## 2015-06-04 NOTE — Assessment & Plan Note (Signed)
Siutational Support offered today Will start Celexa 10 mg PO QHS, eRx sent RX for Xanax 0.5 mg Daily prn for panic attacks Referral placed to Dr. Rexene Edison  RTC in 4-6 weeks to follow up with me

## 2015-06-04 NOTE — Patient Instructions (Signed)
Stress Stress-related medical problems are becoming increasingly common. The body has a built-in physical response to stressful situations. Faced with pressure, challenge or danger, we need to react quickly. Our bodies release hormones such as cortisol and adrenaline to help do this. These hormones are part of the "fight or flight" response and affect the metabolic rate, heart rate and blood pressure, resulting in a heightened, stressed state that prepares the body for optimum performance in dealing with a stressful situation. It is likely that early man required these mechanisms to stay alive, but usually modern stresses do not call for this, and the same hormones released in today's world can damage health and reduce coping ability. CAUSES  Pressure to perform at work, at school or in sports.  Threats of physical violence.  Money worries.  Arguments.  Family conflicts.  Divorce or separation from significant other.  Bereavement.  New job or unemployment.  Changes in location.  Alcohol or drug abuse. SOMETIMES, THERE IS NO PARTICULAR REASON FOR DEVELOPING STRESS. Almost all people are at risk of being stressed at some time in their lives. It is important to know that some stress is temporary and some is long term.  Temporary stress will go away when a situation is resolved. Most people can cope with short periods of stress, and it can often be relieved by relaxing, taking a walk or getting any type of exercise, chatting through issues with friends, or having a good night's sleep.  Chronic (long-term, continuous) stress is much harder to deal with. It can be psychologically and emotionally damaging. It can be harmful both for an individual and for friends and family. SYMPTOMS Everyone reacts to stress differently. There are some common effects that help us recognize it. In times of extreme stress, people may:  Shake uncontrollably.  Breathe faster and deeper than normal  (hyperventilate).  Vomit.  For people with asthma, stress can trigger an attack.  For some people, stress may trigger migraine headaches, ulcers, and body pain. PHYSICAL EFFECTS OF STRESS MAY INCLUDE:  Loss of energy.  Skin problems.  Aches and pains resulting from tense muscles, including neck ache, backache and tension headaches.  Increased pain from arthritis and other conditions.  Irregular heart beat (palpitations).  Periods of irritability or anger.  Apathy or depression.  Anxiety (feeling uptight or worrying).  Unusual behavior.  Loss of appetite.  Comfort eating.  Lack of concentration.  Loss of, or decreased, sex-drive.  Increased smoking, drinking, or recreational drug use.  For women, missed periods.  Ulcers, joint pain, and muscle pain. Post-traumatic stress is the stress caused by any serious accident, strong emotional damage, or extremely difficult or violent experience such as rape or war. Post-traumatic stress victims can experience mixtures of emotions such as fear, shame, depression, guilt or anger. It may include recurrent memories or images that may be haunting. These feelings can last for weeks, months or even years after the traumatic event that triggered them. Specialized treatment, possibly with medicines and psychological therapies, is available. If stress is causing physical symptoms, severe distress or making it difficult for you to function as normal, it is worth seeing your caregiver. It is important to remember that although stress is a usual part of life, extreme or prolonged stress can lead to other illnesses that will need treatment. It is better to visit a doctor sooner rather than later. Stress has been linked to the development of high blood pressure and heart disease, as well as insomnia and depression.   There is no diagnostic test for stress since everyone reacts to it differently. But a caregiver will be able to spot the physical  symptoms, such as:  Headaches.  Shingles.  Ulcers. Emotional distress such as intense worry, low mood or irritability should be detected when the doctor asks pertinent questions to identify any underlying problems that might be the cause. In case there are physical reasons for the symptoms, the doctor may also want to do some tests to exclude certain conditions. If you feel that you are suffering from stress, try to identify the aspects of your life that are causing it. Sometimes you may not be able to change or avoid them, but even a small change can have a positive ripple effect. A simple lifestyle change can make all the difference. STRATEGIES THAT CAN HELP DEAL WITH STRESS:  Delegating or sharing responsibilities.  Avoiding confrontations.  Learning to be more assertive.  Regular exercise.  Avoid using alcohol or street drugs to cope.  Eating a healthy, balanced diet, rich in fruit and vegetables and proteins.  Finding humor or absurdity in stressful situations.  Never taking on more than you know you can handle comfortably.  Organizing your time better to get as much done as possible.  Talking to friends or family and sharing your thoughts and fears.  Listening to music or relaxation tapes.  Relaxation techniques like deep breathing, meditation, and yoga.  Tensing and then relaxing your muscles, starting at the toes and working up to the head and neck. If you think that you would benefit from help, either in identifying the things that are causing your stress or in learning techniques to help you relax, see a caregiver who is capable of helping you with this. Rather than relying on medications, it is usually better to try and identify the things in your life that are causing stress and try to deal with them. There are many techniques of managing stress including counseling, psychotherapy, aromatherapy, yoga, and exercise. Your caregiver can help you determine what is best  for you. Document Released: 01/21/2003 Document Revised: 11/05/2013 Document Reviewed: 12/18/2007 Exit[employer] Patient Information 2015 [employer]. This information is not intended to replace advice given to you by your health care provider. Make sure you discuss any questions you have with your health care provider.  

## 2015-06-04 NOTE — Progress Notes (Signed)
Pre visit review using our clinic review tool, if applicable. No additional management support is needed unless otherwise documented below in the visit note. 

## 2015-06-29 ENCOUNTER — Ambulatory Visit (INDEPENDENT_AMBULATORY_CARE_PROVIDER_SITE_OTHER): Payer: 59 | Admitting: Internal Medicine

## 2015-06-29 ENCOUNTER — Encounter: Payer: Self-pay | Admitting: Internal Medicine

## 2015-06-29 VITALS — BP 128/80 | HR 78 | Temp 98.4°F | Wt 223.5 lb

## 2015-06-29 DIAGNOSIS — F418 Other specified anxiety disorders: Secondary | ICD-10-CM | POA: Diagnosis not present

## 2015-06-29 DIAGNOSIS — F419 Anxiety disorder, unspecified: Principal | ICD-10-CM

## 2015-06-29 DIAGNOSIS — F329 Major depressive disorder, single episode, unspecified: Secondary | ICD-10-CM

## 2015-06-29 DIAGNOSIS — F32A Depression, unspecified: Secondary | ICD-10-CM

## 2015-06-29 NOTE — Progress Notes (Signed)
Pre visit review using our clinic review tool, if applicable. No additional management support is needed unless otherwise documented below in the visit note. 

## 2015-06-29 NOTE — Assessment & Plan Note (Addendum)
Improved on Celexa and Xanax Support offered today I will fill out his FMLA papers for him when he brings them to me Keep you appt with Dr. Rexene Edison

## 2015-06-29 NOTE — Progress Notes (Signed)
Subjective:    Patient ID: Joshua Bean, male    DOB: 04/22/1976, 39 y.o.   MRN: [id number]  HPI  Pt presents to the clinic today to follow up anxiety and depression. He was started on Celexa and Xanax and does feel like it is helping.He does report that the Celexa makes him feel like a zombie, it makes him feel hot and sweaty.  He has only used about half the bottle of Xanax. He has decided to leave his wife but he is currently still staying at their house until he can find a place to live. All of this is wearing on him. He has missed about 4 days of work and is in jeopardy of losing his job. He wants to know if I can fill out FMLA papers for him. He does have an appt with Dr. Rexene Edison tomorrow. He is not sure how frequently he will be seeing her. He denies SI/HI.  Review of Systems  Past Medical History  Diagnosis Date  . Anxiety   . Chicken pox   . Skin cancer of face     Current Outpatient Prescriptions  Medication Sig Dispense Refill  . albuterol (PROVENTIL HFA;VENTOLIN HFA) 108 (90 BASE) MCG/ACT inhaler Inhale 1-2 puffs into the lungs every 6 (six) hours as needed for wheezing or shortness of breath. 1 Inhaler 0  . ALPRAZolam (XANAX) 0.5 MG tablet Take 1 tablet (0.5 mg total) by mouth daily as needed for anxiety. 30 tablet 0  . citalopram (CELEXA) 10 MG tablet Take 1 tablet (10 mg total) by mouth daily. 30 tablet 1  . cyclobenzaprine (FLEXERIL) 10 MG tablet Take 5 mg by mouth as needed for muscle spasms.    . meloxicam (MOBIC) 15 MG tablet Take 15 mg by mouth daily as needed for pain.    Marland Kitchen triamcinolone (NASACORT) 55 MCG/ACT AERO nasal inhaler Place 2 sprays into the nose daily.     No current facility-administered medications for this visit.    Allergies  Allergen Reactions  . Bee Venom Anaphylaxis    Family History  Problem Relation Age of Onset  . Heart disease Mother   . Diabetes Mother   . Heart disease Father   . Diabetes Father   . Cancer Neg Hx   . Stroke Neg  Hx     Social History   Social History  . Marital Status: Married    Spouse Name: N/A  . Number of Children: N/A  . Years of Education: N/A   Occupational History  . Not on file.   Social History Main Topics  . Smoking status: Never Smoker   . Smokeless tobacco: Current User    Types: Chew  . Alcohol Use: 0.0 oz/week    0 Standard drinks or equivalent per week     Comment: occasional  . Drug Use: No  . Sexual Activity: Yes   Other Topics Concern  . Not on file   Social History Narrative     Constitutional: Denies fever, malaise, fatigue, headache or abrupt weight changes.  Respiratory: Denies difficulty breathing, shortness of breath, cough or sputum production.   Cardiovascular: Denies chest pain, chest tightness, palpitations or swelling in the hands or feet.  Psych: Pt reports anxiety and depression. Denies SI/HI.   No other specific complaints in a complete review of systems (except as listed in HPI above).     Objective:   Physical Exam   BP 128/80 mmHg  Pulse 78  Temp(Src) 98.4 F (  36.9 C) (Oral)  Wt 223 lb 8 oz (101.379 kg)  SpO2 98% Wt Readings from Last 3 Encounters:  06/29/15 223 lb 8 oz (101.379 kg)  06/04/15 235 lb 8 oz (106.822 kg)  04/16/15 237 lb (107.502 kg)    General: Appears his stated age, he is down 12 lbs in 3 weeks. Cardiovascular: Normal rate and rhythm. S1,S2 noted.  No murmur, rubs or gallops noted.  Pulmonary/Chest: Normal effort and positive vesicular breath sounds. No respiratory distress. No wheezes, rales or ronchi noted.  Neurological: Alert and oriented.  Psychiatric: He is very tearful today. He can not sit still. He is avoiding eye contact.  BMET    Component Value Date/Time   NA 140 10/21/2014 1426   NA 137 06/13/2014 2352   K 4.5 10/21/2014 1426   CL 102 10/21/2014 1426   CO2 20 10/21/2014 1426   GLUCOSE 145* 10/21/2014 1426   GLUCOSE 204* 06/13/2014 2352   BUN 17 10/21/2014 1426   BUN 16 06/13/2014 2352    CREATININE 0.84 10/21/2014 1426   CALCIUM 9.4 10/21/2014 1426   GFRNONAA 112 10/21/2014 1426   GFRAA 129 10/21/2014 1426    Lipid Panel     Component Value Date/Time   CHOL 129 02/02/2015 1412   TRIG 225* 02/02/2015 1412   HDL 26* 02/02/2015 1412   CHOLHDL 5.0 02/02/2015 1412   LDLCALC 58 02/02/2015 1412    CBC    Component Value Date/Time   WBC 6.0 10/21/2014 1426   WBC 6.0 06/13/2014 2352   RBC 4.99 10/21/2014 1426   RBC 4.71 06/13/2014 2352   HGB 15.7 10/21/2014 1426   HCT 45.4 10/21/2014 1426   PLT 278 10/21/2014 1426   MCV 91 10/21/2014 1426   MCH 31.5 10/21/2014 1426   MCH 32.5 06/13/2014 2352   MCHC 34.6 10/21/2014 1426   MCHC 35.8 06/13/2014 2352   RDW 13.6 10/21/2014 1426   RDW 13.0 06/13/2014 2352   LYMPHSABS 2.6 10/21/2014 1426   LYMPHSABS 1.6 06/13/2014 2352   MONOABS 0.5 06/13/2014 2352   EOSABS 0.2 10/21/2014 1426   EOSABS 0.1 06/13/2014 2352   BASOSABS 0.1 10/21/2014 1426   BASOSABS 0.0 06/13/2014 2352    Hgb A1C No results found for: HGBA1C      Assessment & Plan:

## 2015-06-29 NOTE — Patient Instructions (Signed)
Generalized Anxiety Disorder Generalized anxiety disorder (GAD) is a mental disorder. It interferes with life functions, including relationships, work, and school. GAD is different from normal anxiety, which everyone experiences at some point in their lives in response to specific life events and activities. Normal anxiety actually helps us prepare for and get through these life events and activities. Normal anxiety goes away after the event or activity is over.  GAD causes anxiety that is not necessarily related to specific events or activities. It also causes excess anxiety in proportion to specific events or activities. The anxiety associated with GAD is also difficult to control. GAD can vary from mild to severe. People with severe GAD can have intense waves of anxiety with physical symptoms (panic attacks).  SYMPTOMS The anxiety and worry associated with GAD are difficult to control. This anxiety and worry are related to many life events and activities and also occur more days than not for 6 months or longer. People with GAD also have three or more of the following symptoms (one or more in children):  Restlessness.   Fatigue.  Difficulty concentrating.   Irritability.  Muscle tension.  Difficulty sleeping or unsatisfying sleep. DIAGNOSIS GAD is diagnosed through an assessment by your health care provider. Your health care provider will ask you questions aboutyour mood,physical symptoms, and events in your life. Your health care provider may ask you about your medical history and use of alcohol or drugs, including prescription medicines. Your health care provider may also do a physical exam and blood tests. Certain medical conditions and the use of certain substances can cause symptoms similar to those associated with GAD. Your health care provider may refer you to a mental health specialist for further evaluation. TREATMENT The following therapies are usually used to treat GAD:    Medication. Antidepressant medication usually is prescribed for long-term daily control. Antianxiety medicines may be added in severe cases, especially when panic attacks occur.   Talk therapy (psychotherapy). Certain types of talk therapy can be helpful in treating GAD by providing support, education, and guidance. A form of talk therapy called cognitive behavioral therapy can teach you healthy ways to think about and react to daily life events and activities.  Stress managementtechniques. These include yoga, meditation, and exercise and can be very helpful when they are practiced regularly. A mental health specialist can help determine which treatment is best for you. Some people see improvement with one therapy. However, other people require a combination of therapies. Document Released: 02/25/2013 Document Revised: 03/17/2014 Document Reviewed: 02/25/2013 Exit[employer] Patient Information 2015 [employer]. This information is not intended to replace advice given to you by your health care provider. Make sure you discuss any questions you have with your health care provider.  

## 2015-07-01 ENCOUNTER — Ambulatory Visit (INDEPENDENT_AMBULATORY_CARE_PROVIDER_SITE_OTHER): Payer: 59 | Admitting: Psychology

## 2015-07-01 DIAGNOSIS — F4323 Adjustment disorder with mixed anxiety and depressed mood: Secondary | ICD-10-CM | POA: Diagnosis not present

## 2015-07-02 ENCOUNTER — Telehealth: Payer: Self-pay | Admitting: Internal Medicine

## 2015-07-02 DIAGNOSIS — Z0279 Encounter for issue of other medical certificate: Secondary | ICD-10-CM

## 2015-07-02 NOTE — Telephone Encounter (Signed)
Done, given back to robin 

## 2015-07-02 NOTE — Telephone Encounter (Signed)
Copy faxed to [phone] Pt aware  Copy for billing   Copy for file Copy for scan Copy for pt

## 2015-07-02 NOTE — Telephone Encounter (Signed)
Pt dropped off FMLA paperwork  In [facility] IN BOX For review and signature

## 2015-07-14 ENCOUNTER — Telehealth: Payer: Self-pay | Admitting: Internal Medicine

## 2015-07-14 DIAGNOSIS — Z7689 Persons encountering health services in other specified circumstances: Secondary | ICD-10-CM

## 2015-07-14 NOTE — Telephone Encounter (Signed)
done

## 2015-07-14 NOTE — Telephone Encounter (Signed)
Pt aware paperwork faxed  Copy for pt  Copy for scan Copy for billing Copy for file

## 2015-07-14 NOTE — Telephone Encounter (Signed)
Pt dropped off additional fmla paperwork to be filled out In [place]

## 2015-07-29 ENCOUNTER — Ambulatory Visit (INDEPENDENT_AMBULATORY_CARE_PROVIDER_SITE_OTHER): Payer: 59 | Admitting: Psychology

## 2015-07-29 DIAGNOSIS — F4323 Adjustment disorder with mixed anxiety and depressed mood: Secondary | ICD-10-CM

## 2015-08-18 ENCOUNTER — Encounter: Payer: Self-pay | Admitting: Internal Medicine

## 2015-08-18 ENCOUNTER — Ambulatory Visit (INDEPENDENT_AMBULATORY_CARE_PROVIDER_SITE_OTHER): Payer: 59 | Admitting: Internal Medicine

## 2015-08-18 VITALS — BP 112/78 | HR 114 | Temp 98.5°F | Wt 211.0 lb

## 2015-08-18 DIAGNOSIS — F411 Generalized anxiety disorder: Secondary | ICD-10-CM

## 2015-08-18 DIAGNOSIS — Z23 Encounter for immunization: Secondary | ICD-10-CM | POA: Diagnosis not present

## 2015-08-18 MED ORDER — ALPRAZOLAM 0.5 MG PO TABS: 0.5000 mg | ORAL_TABLET | Freq: Every day | ORAL | Status: DC | PRN

## 2015-08-18 MED ORDER — BUSPIRONE HCL 7.5 MG PO TABS: 7.5000 mg | ORAL_TABLET | Freq: Two times a day (BID) | ORAL | Status: DC

## 2015-08-18 NOTE — Progress Notes (Signed)
Subjective:    Patient ID: Joshua Bean, male    DOB: 12/02/1975, 39 y.o.   MRN: [id number]  HPI  Pt presents to the clinic today to follow up anxiety. He reports he has started having nightmares. He is having trouble sleeping due to his anxiety. His mood is labile, he will have episodes of crying and then gets angry and over excited at times. He stopped taking his Celexa 6 weeks ago. He did restart his Celexa 4-5 days ago but he does not feel like it has helped. He has been taking the Xanax, which does seem to help with these episodes. He denies depression, SI/HI. He is going to therapy with his wife, trying to work things out.  He would like a flu shot today.  Review of Systems      Past Medical History  Diagnosis Date  . Anxiety   . Chicken pox   . Skin cancer of face     Current Outpatient Prescriptions  Medication Sig Dispense Refill  . albuterol (PROVENTIL HFA;VENTOLIN HFA) 108 (90 BASE) MCG/ACT inhaler Inhale 1-2 puffs into the lungs every 6 (six) hours as needed for wheezing or shortness of breath. 1 Inhaler 0  . ALPRAZolam (XANAX) 0.5 MG tablet Take 1 tablet (0.5 mg total) by mouth daily as needed for anxiety. 30 tablet 0  . citalopram (CELEXA) 10 MG tablet Take 1 tablet (10 mg total) by mouth daily. 30 tablet 1  . cyclobenzaprine (FLEXERIL) 10 MG tablet Take 5 mg by mouth as needed for muscle spasms.    . meloxicam (MOBIC) 15 MG tablet Take 15 mg by mouth daily as needed for pain.    Marland Kitchen triamcinolone (NASACORT) 55 MCG/ACT AERO nasal inhaler Place 2 sprays into the nose daily.     No current facility-administered medications for this visit.    Allergies  Allergen Reactions  . Bee Venom Anaphylaxis    Family History  Problem Relation Age of Onset  . Heart disease Mother   . Diabetes Mother   . Heart disease Father   . Diabetes Father   . Cancer Neg Hx   . Stroke Neg Hx     Social History   Social History  . Marital Status: Married    Spouse Name: N/A  .  Number of Children: N/A  . Years of Education: N/A   Occupational History  . Not on file.   Social History Main Topics  . Smoking status: Never Smoker   . Smokeless tobacco: Current User    Types: Chew  . Alcohol Use: 0.0 oz/week    0 Standard drinks or equivalent per week     Comment: occasional  . Drug Use: No  . Sexual Activity: Yes   Other Topics Concern  . Not on file   Social History Narrative     Constitutional: Denies fever, malaise, fatigue, headache or abrupt weight changes.  Neurological: Denies dizziness, difficulty with memory, difficulty with speech or problems with balance and coordination.  Psych: Pt reports anxiety. Denies depression, SI/HI.  No other specific complaints in a complete review of systems (except as listed in HPI above).  Objective:   Physical Exam  BP 112/78 mmHg  Pulse 114  Temp(Src) 98.5 F (36.9 C) (Oral)  Wt 211 lb (95.709 kg)  SpO2 98% Wt Readings from Last 3 Encounters:  08/18/15 211 lb (95.709 kg)  06/29/15 223 lb 8 oz (101.379 kg)  06/04/15 235 lb 8 oz (106.822 kg)  General: Appears his stated age, well developed, well nourished in NAD. Cardiovascular: Normal rate and rhythm. S1,S2 noted.  No murmur, rubs or gallops noted. No JVD or BLE edema. No carotid bruits noted. Pulmonary/Chest: Normal effort and positive vesicular breath sounds. No respiratory distress. No wheezes, rales or ronchi noted.  Neurological: Alert and oriented.  Psychiatric: His affect is still mildly flat but he is making eye contact and engages. He is not tearful today.  BMET    Component Value Date/Time   NA 140 10/21/2014 1426   NA 137 06/13/2014 2352   K 4.5 10/21/2014 1426   CL 102 10/21/2014 1426   CO2 20 10/21/2014 1426   GLUCOSE 145* 10/21/2014 1426   GLUCOSE 204* 06/13/2014 2352   BUN 17 10/21/2014 1426   BUN 16 06/13/2014 2352   CREATININE 0.84 10/21/2014 1426   CALCIUM 9.4 10/21/2014 1426   GFRNONAA 112 10/21/2014 1426   GFRAA 129  10/21/2014 1426    Lipid Panel     Component Value Date/Time   CHOL 129 02/02/2015 1412   TRIG 225* 02/02/2015 1412   HDL 26* 02/02/2015 1412   CHOLHDL 5.0 02/02/2015 1412   LDLCALC 58 02/02/2015 1412    CBC    Component Value Date/Time   WBC 6.0 10/21/2014 1426   WBC 6.0 06/13/2014 2352   RBC 4.99 10/21/2014 1426   RBC 4.71 06/13/2014 2352   HGB 15.7 10/21/2014 1426   HCT 45.4 10/21/2014 1426   PLT 278 10/21/2014 1426   MCV 91 10/21/2014 1426   MCH 31.5 10/21/2014 1426   MCH 32.5 06/13/2014 2352   MCHC 34.6 10/21/2014 1426   MCHC 35.8 06/13/2014 2352   RDW 13.6 10/21/2014 1426   RDW 13.0 06/13/2014 2352   LYMPHSABS 2.6 10/21/2014 1426   LYMPHSABS 1.6 06/13/2014 2352   MONOABS 0.5 06/13/2014 2352   EOSABS 0.2 10/21/2014 1426   EOSABS 0.1 06/13/2014 2352   BASOSABS 0.1 10/21/2014 1426   BASOSABS 0.0 06/13/2014 2352    Hgb A1C No results found for: HGBA1C       Assessment & Plan:   Need for influenza vaccine:  Flu shot today  RTC as needed or if symptoms persist or worsen

## 2015-08-18 NOTE — Patient Instructions (Signed)
Generalized Anxiety Disorder Generalized anxiety disorder (GAD) is a mental disorder. It interferes with life functions, including relationships, work, and school. GAD is different from normal anxiety, which everyone experiences at some point in their lives in response to specific life events and activities. Normal anxiety actually helps us prepare for and get through these life events and activities. Normal anxiety goes away after the event or activity is over.  GAD causes anxiety that is not necessarily related to specific events or activities. It also causes excess anxiety in proportion to specific events or activities. The anxiety associated with GAD is also difficult to control. GAD can vary from mild to severe. People with severe GAD can have intense waves of anxiety with physical symptoms (panic attacks).  SYMPTOMS The anxiety and worry associated with GAD are difficult to control. This anxiety and worry are related to many life events and activities and also occur more days than not for 6 months or longer. People with GAD also have three or more of the following symptoms (one or more in children):  Restlessness.   Fatigue.  Difficulty concentrating.   Irritability.  Muscle tension.  Difficulty sleeping or unsatisfying sleep. DIAGNOSIS GAD is diagnosed through an assessment by your health care provider. Your health care provider will ask you questions aboutyour mood,physical symptoms, and events in your life. Your health care provider may ask you about your medical history and use of alcohol or drugs, including prescription medicines. Your health care provider may also do a physical exam and blood tests. Certain medical conditions and the use of certain substances can cause symptoms similar to those associated with GAD. Your health care provider may refer you to a mental health specialist for further evaluation. TREATMENT The following therapies are usually used to treat GAD:    Medication. Antidepressant medication usually is prescribed for long-term daily control. Antianxiety medicines may be added in severe cases, especially when panic attacks occur.   Talk therapy (psychotherapy). Certain types of talk therapy can be helpful in treating GAD by providing support, education, and guidance. A form of talk therapy called cognitive behavioral therapy can teach you healthy ways to think about and react to daily life events and activities.  Stress managementtechniques. These include yoga, meditation, and exercise and can be very helpful when they are practiced regularly. A mental health specialist can help determine which treatment is best for you. Some people see improvement with one therapy. However, other people require a combination of therapies. Document Released: 02/25/2013 Document Revised: 03/17/2014 Document Reviewed: 02/25/2013 Exit[employer] Patient Information 2015 [employer]. This information is not intended to replace advice given to you by your health care provider. Make sure you discuss any questions you have with your health care provider.  

## 2015-08-18 NOTE — Progress Notes (Signed)
Pre visit review using our clinic review tool, if applicable. No additional management support is needed unless otherwise documented below in the visit note. 

## 2015-08-18 NOTE — Assessment & Plan Note (Signed)
Deteriorated He wants to stop Celexa Start Buspar, eRx sent Continue Xanax but advised him this is not something I want him to be taking daily  RTC in 4 weeks to reevaluate

## 2015-08-27 ENCOUNTER — Ambulatory Visit (INDEPENDENT_AMBULATORY_CARE_PROVIDER_SITE_OTHER): Payer: 59 | Admitting: Psychology

## 2015-08-27 DIAGNOSIS — F4323 Adjustment disorder with mixed anxiety and depressed mood: Secondary | ICD-10-CM | POA: Diagnosis not present

## 2015-09-17 ENCOUNTER — Ambulatory Visit (INDEPENDENT_AMBULATORY_CARE_PROVIDER_SITE_OTHER): Payer: 59 | Admitting: Psychology

## 2015-09-17 DIAGNOSIS — F4323 Adjustment disorder with mixed anxiety and depressed mood: Secondary | ICD-10-CM

## 2015-10-06 ENCOUNTER — Ambulatory Visit: Payer: 59 | Admitting: Psychology

## 2015-10-20 ENCOUNTER — Ambulatory Visit (INDEPENDENT_AMBULATORY_CARE_PROVIDER_SITE_OTHER): Payer: 59 | Admitting: Psychology

## 2015-10-20 DIAGNOSIS — F4323 Adjustment disorder with mixed anxiety and depressed mood: Secondary | ICD-10-CM

## 2015-10-28 ENCOUNTER — Encounter: Payer: Self-pay | Admitting: Internal Medicine

## 2015-10-28 ENCOUNTER — Ambulatory Visit (INDEPENDENT_AMBULATORY_CARE_PROVIDER_SITE_OTHER): Payer: 59 | Admitting: Internal Medicine

## 2015-10-28 VITALS — BP 118/74 | HR 91 | Temp 98.2°F | Wt 213.0 lb

## 2015-10-28 DIAGNOSIS — F411 Generalized anxiety disorder: Secondary | ICD-10-CM

## 2015-10-28 MED ORDER — ALPRAZOLAM 0.5 MG PO TABS: 0.5000 mg | ORAL_TABLET | Freq: Every day | ORAL | Status: DC | PRN

## 2015-10-28 NOTE — Progress Notes (Signed)
Subjective:    Patient ID: Joshua Bean, male    DOB: [birth year], 39 y.o.   MRN: [id number]  HPI  Pt presents to the clinic today to follow up anxiety. We stopped his Celexa 08/2015, because he felt like it was not effective. We transitioned him to Buspar. He reports he never picked up the prescription. He continues to take Xanax prn, about every 3 days. He reports him and his wife are going to therapy now, 2 x month. He does feel like he is a little more down, feeling alone at the holidays. He also reports his parents told him that if he got back together with his wife, that they would disown him. He feels like he is stuck between a rock and hard place. He denies SI/HI.  Review of Systems      Past Medical History  Diagnosis Date  . Anxiety   . Chicken pox   . Skin cancer of face     Current Outpatient Prescriptions  Medication Sig Dispense Refill  . albuterol (PROVENTIL HFA;VENTOLIN HFA) 108 (90 BASE) MCG/ACT inhaler Inhale 1-2 puffs into the lungs every 6 (six) hours as needed for wheezing or shortness of breath. 1 Inhaler 0  . ALPRAZolam (XANAX) 0.5 MG tablet Take 1 tablet (0.5 mg total) by mouth daily as needed for anxiety. 30 tablet 0  . busPIRone (BUSPAR) 7.5 MG tablet Take 1 tablet (7.5 mg total) by mouth 2 (two) times daily. 60 tablet 1  . cyclobenzaprine (FLEXERIL) 10 MG tablet Take 5 mg by mouth as needed for muscle spasms.    . meloxicam (MOBIC) 15 MG tablet Take 15 mg by mouth daily as needed for pain.    Marland Kitchen triamcinolone (NASACORT) 55 MCG/ACT AERO nasal inhaler Place 2 sprays into the nose daily.     No current facility-administered medications for this visit.    Allergies  Allergen Reactions  . Bee Venom Anaphylaxis    Family History  Problem Relation Age of Onset  . Heart disease Mother   . Diabetes Mother   . Heart disease Father   . Diabetes Father   . Cancer Neg Hx   . Stroke Neg Hx     Social History   Social History  . Marital Status: Married    Spouse Name: N/A  . Number of Children: N/A  . Years of Education: N/A   Occupational History  . Not on file.   Social History Main Topics  . Smoking status: Never Smoker   . Smokeless tobacco: Current User    Types: Chew  . Alcohol Use: 0.0 oz/week    0 Standard drinks or equivalent per week     Comment: occasional  . Drug Use: No  . Sexual Activity: Yes   Other Topics Concern  . Not on file   Social History Narrative     Constitutional: Denies fever, malaise, fatigue, headache or abrupt weight changes.   Neurological: Denies dizziness, difficulty with memory, difficulty with speech or problems with balance and coordination.  Psych: Pt reports anxiety. Denies depression, SI/HI.  No other specific complaints in a complete review of systems (except as listed in HPI above).  Objective:   Physical Exam  BP 118/74 mmHg  Pulse 91  Temp(Src) 98.2 F (36.8 C) (Oral)  Wt 213 lb (96.616 kg)  SpO2 98%  Wt Readings from Last 3 Encounters:  10/28/15 213 lb (96.616 kg)  08/18/15 211 lb (95.709 kg)  06/29/15 223 lb 8 oz (101.379  kg)    General: Appears his stated age, in NAD. Neurological: Alert and oriented.  Psychiatric: Mood and affect mildly flat. He is slightly tearful at times. Behavior and thought content is normal.    BMET    Component Value Date/Time   NA 140 10/21/2014 1426   NA 137 06/13/2014 2352   K 4.5 10/21/2014 1426   CL 102 10/21/2014 1426   CO2 20 10/21/2014 1426   GLUCOSE 145* 10/21/2014 1426   GLUCOSE 204* 06/13/2014 2352   BUN 17 10/21/2014 1426   BUN 16 06/13/2014 2352   CREATININE 0.84 10/21/2014 1426   CALCIUM 9.4 10/21/2014 1426   GFRNONAA 112 10/21/2014 1426   GFRAA 129 10/21/2014 1426    Lipid Panel     Component Value Date/Time   CHOL 129 02/02/2015 1412   TRIG 225* 02/02/2015 1412   HDL 26* 02/02/2015 1412   CHOLHDL 5.0 02/02/2015 1412   LDLCALC 58 02/02/2015 1412    CBC    Component Value Date/Time   WBC 6.0  10/21/2014 1426   WBC 6.0 06/13/2014 2352   RBC 4.99 10/21/2014 1426   RBC 4.71 06/13/2014 2352   HGB 15.7 10/21/2014 1426   HCT 45.4 10/21/2014 1426   PLT 278 10/21/2014 1426   MCV 91 10/21/2014 1426   MCH 31.5 10/21/2014 1426   MCH 32.5 06/13/2014 2352   MCHC 34.6 10/21/2014 1426   MCHC 35.8 06/13/2014 2352   RDW 13.6 10/21/2014 1426   RDW 13.0 06/13/2014 2352   LYMPHSABS 2.6 10/21/2014 1426   LYMPHSABS 1.6 06/13/2014 2352   MONOABS 0.5 06/13/2014 2352   EOSABS 0.2 10/21/2014 1426   EOSABS 0.1 06/13/2014 2352   BASOSABS 0.1 10/21/2014 1426   BASOSABS 0.0 06/13/2014 2352    Hgb A1C No results found for: HGBA1C      Assessment & Plan:

## 2015-10-28 NOTE — Patient Instructions (Signed)
Generalized Anxiety Disorder Generalized anxiety disorder (GAD) is a mental disorder. It interferes with life functions, including relationships, work, and school. GAD is different from normal anxiety, which everyone experiences at some point in their lives in response to specific life events and activities. Normal anxiety actually helps us prepare for and get through these life events and activities. Normal anxiety goes away after the event or activity is over.  GAD causes anxiety that is not necessarily related to specific events or activities. It also causes excess anxiety in proportion to specific events or activities. The anxiety associated with GAD is also difficult to control. GAD can vary from mild to severe. People with severe GAD can have intense waves of anxiety with physical symptoms (panic attacks).  SYMPTOMS The anxiety and worry associated with GAD are difficult to control. This anxiety and worry are related to many life events and activities and also occur more days than not for 6 months or longer. People with GAD also have three or more of the following symptoms (one or more in children):  Restlessness.   Fatigue.  Difficulty concentrating.   Irritability.  Muscle tension.  Difficulty sleeping or unsatisfying sleep. DIAGNOSIS GAD is diagnosed through an assessment by your health care provider. Your health care provider will ask you questions aboutyour mood,physical symptoms, and events in your life. Your health care provider may ask you about your medical history and use of alcohol or drugs, including prescription medicines. Your health care provider may also do a physical exam and blood tests. Certain medical conditions and the use of certain substances can cause symptoms similar to those associated with GAD. Your health care provider may refer you to a mental health specialist for further evaluation. TREATMENT The following therapies are usually used to treat GAD:    Medication. Antidepressant medication usually is prescribed for long-term daily control. Antianxiety medicines may be added in severe cases, especially when panic attacks occur.   Talk therapy (psychotherapy). Certain types of talk therapy can be helpful in treating GAD by providing support, education, and guidance. A form of talk therapy called cognitive behavioral therapy can teach you healthy ways to think about and react to daily life events and activities.  Stress managementtechniques. These include yoga, meditation, and exercise and can be very helpful when they are practiced regularly. A mental health specialist can help determine which treatment is best for you. Some people see improvement with one therapy. However, other people require a combination of therapies.   This information is not intended to replace advice given to you by your health care provider. Make sure you discuss any questions you have with your health care provider.   Document Released: 02/25/2013 Document Revised: 11/21/2014 Document Reviewed: 02/25/2013 Elsevier Interactive Patient Education 2016 [employer]  

## 2015-10-28 NOTE — Progress Notes (Signed)
Pre visit review using our clinic review tool, if applicable. No additional management support is needed unless otherwise documented below in the visit note. 

## 2015-10-28 NOTE — Assessment & Plan Note (Signed)
Continue Xanax prn If he starts taking more frequently, will insist that he starts Buspar

## 2015-11-23 ENCOUNTER — Encounter: Payer: 59 | Admitting: Internal Medicine

## 2015-11-24 ENCOUNTER — Ambulatory Visit: Payer: 59 | Admitting: Psychology

## 2015-12-23 ENCOUNTER — Ambulatory Visit: Payer: 59 | Admitting: Psychology

## 2016-02-03 ENCOUNTER — Ambulatory Visit: Payer: 59 | Admitting: Psychology

## 2016-03-29 ENCOUNTER — Encounter: Payer: Self-pay | Admitting: Internal Medicine

## 2016-03-29 ENCOUNTER — Ambulatory Visit (INDEPENDENT_AMBULATORY_CARE_PROVIDER_SITE_OTHER): Payer: 59 | Admitting: Internal Medicine

## 2016-03-29 VITALS — BP 126/78 | HR 95 | Temp 99.0°F | Wt 222.0 lb

## 2016-03-29 DIAGNOSIS — E669 Obesity, unspecified: Secondary | ICD-10-CM

## 2016-03-29 DIAGNOSIS — E785 Hyperlipidemia, unspecified: Secondary | ICD-10-CM

## 2016-03-29 DIAGNOSIS — F411 Generalized anxiety disorder: Secondary | ICD-10-CM

## 2016-03-29 MED ORDER — ALPRAZOLAM 0.5 MG PO TABS: 0.5000 mg | ORAL_TABLET | Freq: Every day | ORAL | Status: DC | PRN

## 2016-03-29 NOTE — Assessment & Plan Note (Signed)
Encouraged him to work on diet and exercise 

## 2016-03-29 NOTE — Progress Notes (Signed)
Pre visit review using our clinic review tool, if applicable. No additional management support is needed unless otherwise documented below in the visit note. 

## 2016-03-29 NOTE — Assessment & Plan Note (Signed)
Encouraged him to consume a low fat diet CMET and Lipid Profile today 

## 2016-03-29 NOTE — Patient Instructions (Signed)

## 2016-03-29 NOTE — Progress Notes (Signed)
Subjective:    Patient ID: Joshua Bean, male    DOB: [birth year], 40 y.o.   MRN: [id number]  HPI  Pt presents to the clinic today to follow up chronic conditions.  Anxiety: Triggered by marital issues. They are separated but not divorced. He only takes the Xanax prn and feels like this is controlling his symptoms well. He denies depression, SI/HI.  HLD: His last LDL was 58, triglycerides 225. He does not take anything OTC for this. He is having difficulty consuming a low fat diet.  Obesity: He has gained 9 lbs since his last visit. His BMI today is 30.1. He does not exercise.  Review of Systems      Past Medical History  Diagnosis Date  . Anxiety   . Chicken pox   . Skin cancer of face     Current Outpatient Prescriptions  Medication Sig Dispense Refill  . ALPRAZolam (XANAX) 0.5 MG tablet Take 1 tablet (0.5 mg total) by mouth daily as needed for anxiety. 30 tablet 0  . cyclobenzaprine (FLEXERIL) 10 MG tablet Take 5 mg by mouth as needed for muscle spasms.    Marland Kitchen triamcinolone (NASACORT) 55 MCG/ACT AERO nasal inhaler Place 2 sprays into the nose daily.     No current facility-administered medications for this visit.    Allergies  Allergen Reactions  . Bee Venom Anaphylaxis    Family History  Problem Relation Age of Onset  . Heart disease Mother   . Diabetes Mother   . Heart disease Father   . Diabetes Father   . Cancer Neg Hx   . Stroke Neg Hx     Social History   Social History  . Marital Status: Married    Spouse Name: N/A  . Number of Children: N/A  . Years of Education: N/A   Occupational History  . Not on file.   Social History Main Topics  . Smoking status: Never Smoker   . Smokeless tobacco: Current User    Types: Chew  . Alcohol Use: 0.0 oz/week    0 Standard drinks or equivalent per week     Comment: rare  . Drug Use: No  . Sexual Activity: Yes   Other Topics Concern  . Not on file   Social History Narrative     Constitutional: Pt  reports weight gain. Denies fever, malaise, fatigue, headache.  Respiratory: Denies difficulty breathing, shortness of breath, cough or sputum production.   Cardiovascular: Denies chest pain, chest tightness, palpitations or swelling in the hands or feet.  Neurological: Denies dizziness, difficulty with memory, difficulty with speech or problems with balance and coordination.  Psych: Pt reports anxiety. Denies depression, SI/HI.  No other specific complaints in a complete review of systems (except as listed in HPI above).  Objective:   Physical Exam   BP 126/78 mmHg  Pulse 95  Temp(Src) 99 F (37.2 C) (Oral)  Wt 222 lb (100.699 kg)  SpO2 97% Wt Readings from Last 3 Encounters:  03/29/16 222 lb (100.699 kg)  10/28/15 213 lb (96.616 kg)  08/18/15 211 lb (95.709 kg)    General: Appears his stated age, obese in NAD. Cardiovascular: Normal rate and rhythm. S1,S2 noted.  No murmur, rubs or gallops noted.  Pulmonary/Chest: Normal effort and positive vesicular breath sounds. No respiratory distress. No wheezes, rales or ronchi noted.  Neurological: Alert and oriented.  Psychiatric: He is mildly anxious today.  BMET    Component Value Date/Time   NA 140 10/21/2014 1426  NA 137 06/13/2014 2352   K 4.5 10/21/2014 1426   CL 102 10/21/2014 1426   CO2 20 10/21/2014 1426   GLUCOSE 145* 10/21/2014 1426   GLUCOSE 204* 06/13/2014 2352   BUN 17 10/21/2014 1426   BUN 16 06/13/2014 2352   CREATININE 0.84 10/21/2014 1426   CALCIUM 9.4 10/21/2014 1426   GFRNONAA 112 10/21/2014 1426   GFRAA 129 10/21/2014 1426    Lipid Panel     Component Value Date/Time   CHOL 129 02/02/2015 1412   TRIG 225* 02/02/2015 1412   HDL 26* 02/02/2015 1412   CHOLHDL 5.0 02/02/2015 1412   LDLCALC 58 02/02/2015 1412    CBC    Component Value Date/Time   WBC 6.0 10/21/2014 1426   WBC 6.0 06/13/2014 2352   RBC 4.99 10/21/2014 1426   RBC 4.71 06/13/2014 2352   HGB 15.7 10/21/2014 1426   HCT 45.4  10/21/2014 1426   PLT 278 10/21/2014 1426   MCV 91 10/21/2014 1426   MCH 31.5 10/21/2014 1426   MCH 32.5 06/13/2014 2352   MCHC 34.6 10/21/2014 1426   MCHC 35.8 06/13/2014 2352   RDW 13.6 10/21/2014 1426   RDW 13.0 06/13/2014 2352   LYMPHSABS 2.6 10/21/2014 1426   LYMPHSABS 1.6 06/13/2014 2352   MONOABS 0.5 06/13/2014 2352   EOSABS 0.2 10/21/2014 1426   EOSABS 0.1 06/13/2014 2352   BASOSABS 0.1 10/21/2014 1426   BASOSABS 0.0 06/13/2014 2352    Hgb A1C No results found for: HGBA1C      Assessment & Plan:

## 2016-03-29 NOTE — Assessment & Plan Note (Signed)
Controlled on Xanax prn- refilled today Support offered today

## 2016-03-29 NOTE — Addendum Note (Signed)
Addended by: Marchia Bond on: 03/29/2016 01:49 PM   Modules accepted: Orders

## 2016-03-30 LAB — COMPREHENSIVE METABOLIC PANEL
ALT: 17 IU/L (ref 0–44)
AST: 14 IU/L (ref 0–40)
Albumin/Globulin Ratio: 1.8 (ref 1.2–2.2)
Albumin: 4.2 g/dL (ref 3.5–5.5)
Alkaline Phosphatase: 67 IU/L (ref 39–117)
BUN/Creatinine Ratio: 13 (ref 9–20)
BUN: 11 mg/dL (ref 6–20)
Bilirubin Total: 0.3 mg/dL (ref 0.0–1.2)
CO2: 21 mmol/L (ref 18–29)
Calcium: 9.2 mg/dL (ref 8.7–10.2)
Chloride: 96 mmol/L (ref 96–106)
Creatinine, Ser: 0.84 mg/dL (ref 0.76–1.27)
GFR calc Af Amer: 128 mL/min/{1.73_m2} (ref >59)
GFR calc non Af Amer: 110 mL/min/{1.73_m2} (ref >59)
Globulin, Total: 2.3 g/dL (ref 1.5–4.5)
Glucose: 210 mg/dL — ABNORMAL HIGH (ref 65–99)
Potassium: 3.9 mmol/L (ref 3.5–5.2)
Sodium: 137 mmol/L (ref 134–144)
Total Protein: 6.5 g/dL (ref 6.0–8.5)

## 2016-03-30 LAB — LIPID PANEL
Chol/HDL Ratio: 7 ratio units — ABNORMAL HIGH (ref 0.0–5.0)
Cholesterol, Total: 209 mg/dL — ABNORMAL HIGH (ref 100–199)
HDL: 30 mg/dL — ABNORMAL LOW (ref >39)
Triglycerides: 636 mg/dL (ref 0–149)

## 2016-04-04 MED ORDER — SIMVASTATIN 20 MG PO TABS: 20.0000 mg | ORAL_TABLET | Freq: Every day | ORAL | Status: DC

## 2016-04-04 NOTE — Addendum Note (Signed)
Addended by: Lurlean Nanny on: 04/04/2016 02:02 PM   Modules accepted: Orders

## 2016-04-13 ENCOUNTER — Ambulatory Visit: Payer: 59 | Admitting: Psychology

## 2016-05-06 ENCOUNTER — Ambulatory Visit (INDEPENDENT_AMBULATORY_CARE_PROVIDER_SITE_OTHER): Payer: 59 | Admitting: Psychology

## 2016-05-06 DIAGNOSIS — F4323 Adjustment disorder with mixed anxiety and depressed mood: Secondary | ICD-10-CM | POA: Diagnosis not present

## 2016-06-05 ENCOUNTER — Observation Stay (HOSPITAL_COMMUNITY)
Admission: EM | Admit: 2016-06-05 | Discharge: 2016-06-08 | Disposition: A | Discharge status: Home / Self Care | Payer: 59 | Attending: Internal Medicine | Admitting: Internal Medicine

## 2016-06-05 ENCOUNTER — Emergency Department (HOSPITAL_COMMUNITY): Payer: 59

## 2016-06-05 ENCOUNTER — Encounter (HOSPITAL_COMMUNITY): Payer: Self-pay | Admitting: Emergency Medicine

## 2016-06-05 DIAGNOSIS — M65872 Other synovitis and tenosynovitis, left ankle and foot: Secondary | ICD-10-CM | POA: Insufficient documentation

## 2016-06-05 DIAGNOSIS — M25472 Effusion, left ankle: Secondary | ICD-10-CM | POA: Diagnosis not present

## 2016-06-05 DIAGNOSIS — M25572 Pain in left ankle and joints of left foot: Secondary | ICD-10-CM | POA: Diagnosis present

## 2016-06-05 DIAGNOSIS — M25475 Effusion, left foot: Secondary | ICD-10-CM | POA: Diagnosis not present

## 2016-06-05 DIAGNOSIS — Z9111 Patient's noncompliance with dietary regimen: Secondary | ICD-10-CM | POA: Diagnosis not present

## 2016-06-05 DIAGNOSIS — M109 Gout, unspecified: Principal | ICD-10-CM | POA: Insufficient documentation

## 2016-06-05 DIAGNOSIS — E669 Obesity, unspecified: Secondary | ICD-10-CM | POA: Diagnosis not present

## 2016-06-05 DIAGNOSIS — Z91030 Bee allergy status: Secondary | ICD-10-CM | POA: Diagnosis not present

## 2016-06-05 DIAGNOSIS — E785 Hyperlipidemia, unspecified: Secondary | ICD-10-CM | POA: Diagnosis not present

## 2016-06-05 DIAGNOSIS — L03116 Cellulitis of left lower limb: Secondary | ICD-10-CM | POA: Insufficient documentation

## 2016-06-05 DIAGNOSIS — E781 Pure hyperglyceridemia: Secondary | ICD-10-CM | POA: Diagnosis not present

## 2016-06-05 DIAGNOSIS — E1165 Type 2 diabetes mellitus with hyperglycemia: Secondary | ICD-10-CM | POA: Diagnosis not present

## 2016-06-05 DIAGNOSIS — M25579 Pain in unspecified ankle and joints of unspecified foot: Secondary | ICD-10-CM

## 2016-06-05 DIAGNOSIS — F411 Generalized anxiety disorder: Secondary | ICD-10-CM | POA: Insufficient documentation

## 2016-06-05 DIAGNOSIS — R739 Hyperglycemia, unspecified: Secondary | ICD-10-CM | POA: Diagnosis present

## 2016-06-05 DIAGNOSIS — J181 Lobar pneumonia, unspecified organism: Secondary | ICD-10-CM | POA: Diagnosis not present

## 2016-06-05 DIAGNOSIS — J189 Pneumonia, unspecified organism: Secondary | ICD-10-CM

## 2016-06-05 DIAGNOSIS — Z6831 Body mass index (BMI) 31.0-31.9, adult: Secondary | ICD-10-CM | POA: Insufficient documentation

## 2016-06-05 DIAGNOSIS — E119 Type 2 diabetes mellitus without complications: Secondary | ICD-10-CM

## 2016-06-05 DIAGNOSIS — M009 Pyogenic arthritis, unspecified: Secondary | ICD-10-CM

## 2016-06-05 LAB — CBC WITH DIFFERENTIAL/PLATELET
Basophils Absolute: 0 10*3/uL (ref 0.0–0.1)
Basophils Relative: 0 %
Eosinophils Absolute: 0.1 10*3/uL (ref 0.0–0.7)
Eosinophils Relative: 2 %
HCT: 42.7 % (ref 39.0–52.0)
Hemoglobin: 14.6 g/dL (ref 13.0–17.0)
Lymphocytes Relative: 24 %
Lymphs Abs: 2.1 10*3/uL (ref 0.7–4.0)
MCH: 31.8 pg (ref 26.0–34.0)
MCHC: 34.2 g/dL (ref 30.0–36.0)
MCV: 93 fL (ref 78.0–100.0)
Monocytes Absolute: 1.2 10*3/uL — ABNORMAL HIGH (ref 0.1–1.0)
Monocytes Relative: 14 %
Neutro Abs: 5.2 10*3/uL (ref 1.7–7.7)
Neutrophils Relative %: 60 %
Platelets: 221 10*3/uL (ref 150–400)
RBC: 4.59 MIL/uL (ref 4.22–5.81)
RDW: 12.9 % (ref 11.5–15.5)
WBC: 8.6 10*3/uL (ref 4.0–10.5)

## 2016-06-05 LAB — COMPREHENSIVE METABOLIC PANEL
ALT: 19 U/L (ref 17–63)
AST: 17 U/L (ref 15–41)
Albumin: 3.7 g/dL (ref 3.5–5.0)
Alkaline Phosphatase: 61 U/L (ref 38–126)
Anion gap: 8 (ref 5–15)
BUN: 10 mg/dL (ref 6–20)
CO2: 23 mmol/L (ref 22–32)
Calcium: 9.2 mg/dL (ref 8.9–10.3)
Chloride: 105 mmol/L (ref 101–111)
Creatinine, Ser: 0.83 mg/dL (ref 0.61–1.24)
GFR calc Af Amer: >60 mL/min (ref >60)
GFR calc non Af Amer: >60 mL/min (ref >60)
Glucose, Bld: 274 mg/dL — ABNORMAL HIGH (ref 65–99)
Potassium: 3.9 mmol/L (ref 3.5–5.1)
Sodium: 136 mmol/L (ref 135–145)
Total Bilirubin: 0.3 mg/dL (ref 0.3–1.2)
Total Protein: 6.6 g/dL (ref 6.5–8.1)

## 2016-06-05 LAB — I-STAT CG4 LACTIC ACID, ED: Lactic Acid, Venous: 2.8 mmol/L (ref 0.5–1.9)

## 2016-06-05 LAB — SEDIMENTATION RATE: Sed Rate: 5 mm/hr (ref 0–16)

## 2016-06-05 LAB — C-REACTIVE PROTEIN: CRP: 3.7 mg/dL — ABNORMAL HIGH (ref <1.0)

## 2016-06-05 MED ORDER — ACETAMINOPHEN 325 MG PO TABS: 650.0000 mg | ORAL_TABLET | Freq: Once | ORAL | Status: DC

## 2016-06-05 MED ORDER — VANCOMYCIN HCL IN DEXTROSE 1-5 GM/200ML-% IV SOLN: 1000.0000 mg | Freq: Once | INTRAVENOUS | Status: DC

## 2016-06-05 MED ORDER — SODIUM CHLORIDE 0.9 % IV BOLUS (SEPSIS): 500.0000 mL | Freq: Once | INTRAVENOUS | Status: DC

## 2016-06-05 MED ORDER — SODIUM CHLORIDE 0.9 % IV BOLUS (SEPSIS)
1000.0000 mL | Freq: Once | INTRAVENOUS | Status: AC
  Administered 2016-06-05: 1000 mL via INTRAVENOUS

## 2016-06-05 MED ORDER — ONDANSETRON HCL 4 MG/2ML IJ SOLN
4.0000 mg | Freq: Once | INTRAMUSCULAR | Status: DC
  Filled 2016-06-05: qty 2

## 2016-06-05 MED ORDER — VANCOMYCIN HCL 10 G IV SOLR
1500.0000 mg | Freq: Two times a day (BID) | INTRAVENOUS | Status: DC
  Filled 2016-06-05 (×2): qty 1500

## 2016-06-05 MED ORDER — MORPHINE SULFATE (PF) 4 MG/ML IV SOLN
6.0000 mg | Freq: Once | INTRAVENOUS | Status: DC
  Filled 2016-06-05: qty 2

## 2016-06-05 MED ORDER — PIPERACILLIN-TAZOBACTAM 3.375 G IVPB 30 MIN
3.3750 g | Freq: Once | INTRAVENOUS | Status: AC
  Administered 2016-06-05: 3.375 g via INTRAVENOUS
  Filled 2016-06-05: qty 50

## 2016-06-05 MED ORDER — VANCOMYCIN HCL 10 G IV SOLR
2000.0000 mg | Freq: Once | INTRAVENOUS | Status: AC
  Administered 2016-06-06: 2000 mg via INTRAVENOUS
  Filled 2016-06-05: qty 2000

## 2016-06-05 MED ORDER — SODIUM CHLORIDE 0.9 % IV BOLUS (SEPSIS): 1000.0000 mL | Freq: Once | INTRAVENOUS | Status: DC

## 2016-06-05 MED ORDER — PIPERACILLIN-TAZOBACTAM 3.375 G IVPB: 3.3750 g | Freq: Three times a day (TID) | INTRAVENOUS | Status: DC

## 2016-06-05 NOTE — ED Provider Notes (Signed)
[place] DEPT Provider Note   CSN: [id number] Arrival date & time: 06/05/16  2127  First Provider Contact:  None     By signing my name below, I, [facility], attest that this documentation has been prepared under the direction and in the presence of Domenic Moras, PA-C. Electronically Signed: Virgel Bouquet, ED Scribe. 06/05/16. 10:17 PM.   History   Chief Complaint Chief Complaint  Patient presents with  . Ankle Pain    HPI Joshua Bean is a 40 y.o. male who presents to the Emergency Department complaining of constant, gradually worsening left ankle pain onset 6 days ago. He describes the pain severity as 2/10 at rest and 8/10 with weight bearing. Pt states that he has plantar fascitis and notes that his symptoms began 6 days ago with mild pain in his left ankle after which he wrapped the ankle and continued his normal activities. He notes that his pain progressively worsened and was followed by redness to the ankle and swelling. Pt felt pain is inside his ankle joint and was worried about joint infection.  Never had joint infection before.  He reports associated fever TMAX 101.5.  Does have occasional cough, and rhinorrhea but sts it's from his allergy and it's not a new sxs. Pain is worse with movement and weight bearing. He has taken ibuprofen with slight relief of pain only. He reports similar symptoms in the past but states that during his prior episodes he had less swelling and no fever. Per pt, he had back surgery 3 years ago for a blown disc.  Denies cough, CP, SOB, abdominal pain, or weakness.  The history is provided by the patient. No language interpreter was used.    Past Medical History:  Diagnosis Date  . Anxiety   . Chicken pox   . Skin cancer of face     Patient Active Problem List   Diagnosis Date Noted  . HLD (hyperlipidemia) 11/03/2014  . Obesity (BMI 30-39.9) 07/28/2014  . Generalized anxiety disorder 07/28/2014    Past Surgical History:    Procedure Laterality Date  . BACK SURGERY         Home Medications    Prior to Admission medications   Medication Sig Start Date End Date Taking? Authorizing Provider  ALPRAZolam Duanne Moron) 0.5 MG tablet Take 1 tablet (0.5 mg total) by mouth daily as needed for anxiety. 03/29/16   Jearld Fenton, NP  cyclobenzaprine (FLEXERIL) 10 MG tablet Take 5 mg by mouth as needed for muscle spasms.    Historical Provider, MD  simvastatin (ZOCOR) 20 MG tablet Take 1 tablet (20 mg total) by mouth at bedtime. 04/04/16   Jearld Fenton, NP  triamcinolone (NASACORT) 55 MCG/ACT AERO nasal inhaler Place 2 sprays into the nose daily.    Historical Provider, MD    Family History Family History  Problem Relation Age of Onset  . Heart disease Mother   . Diabetes Mother   . Heart disease Father   . Diabetes Father   . Cancer Neg Hx   . Stroke Neg Hx     Social History Social History  Substance Use Topics  . Smoking status: Never Smoker  . Smokeless tobacco: Current User    Types: Chew  . Alcohol use 0.0 oz/week     Comment: rare     Allergies   Bee venom   Review of Systems Review of Systems  Constitutional: Positive for fever. Negative for chills.  HENT: Positive for rhinorrhea.  Respiratory: Negative for cough and shortness of breath.   Cardiovascular: Negative for chest pain.  Gastrointestinal: Negative for abdominal pain.  Musculoskeletal: Positive for arthralgias (left ankle) and joint swelling.  All other systems reviewed and are negative.    Physical Exam Updated Vital Signs BP 146/90 (BP Location: Left Arm)   Pulse 115   Temp 99.7 F (37.6 C) (Oral)   Resp 16   Ht 6' (1.829 m)   Wt 230 lb (104.3 kg)   SpO2 99%   BMI 31.19 kg/m   Physical Exam  Constitutional: He is oriented to person, place, and time. He appears well-developed and well-nourished. No distress.  HENT:  Head: Normocephalic and atraumatic.  Eyes: Conjunctivae are normal.  Neck: Normal range of  motion.  Cardiovascular: Normal rate and regular rhythm.   Pulmonary/Chest: Effort normal. No respiratory distress.  Musculoskeletal: Normal range of motion.  Moderate edema noted with surrounding erythema to lateral malleoli region with TTP. DP and PT pulses palpable. FROM on left ankle.  Neurological: He is alert and oriented to person, place, and time.  Skin: Skin is warm and dry.  Psychiatric: He has a normal mood and affect. His behavior is normal.  Nursing note and vitals reviewed.    ED Treatments / Results   DIAGNOSTIC STUDIES: Oxygen Saturation is 99% on RA, normal by my interpretation.    COORDINATION OF CARE: 9:55 PM Will order labs. Will check rectal temperature. Discussed treatment plan with pt at bedside and pt agreed to plan.  10:13 PM Spoke with ED Tech who stated that the pt's rectal temp was 101.9. Called code sepsis. Will transfer pt to room in [place] for further care.   Labs (all labs ordered are listed, but only abnormal results are displayed) Labs Reviewed  COMPREHENSIVE METABOLIC PANEL - Abnormal; Notable for the following:       Result Value   Glucose, Bld 274 (*)    All other components within normal limits  CBC WITH DIFFERENTIAL/PLATELET - Abnormal; Notable for the following:    Monocytes Absolute 1.2 (*)    All other components within normal limits  I-STAT CG4 LACTIC ACID, ED - Abnormal; Notable for the following:    Lactic Acid, Venous 2.80 (*)    All other components within normal limits  CULTURE, BLOOD (ROUTINE X 2)  CULTURE, BLOOD (ROUTINE X 2)  URINE CULTURE  URINALYSIS, ROUTINE W REFLEX MICROSCOPIC (NOT AT [facility])  SEDIMENTATION RATE  C-REACTIVE PROTEIN    Radiology Dg Chest 2 View  Result Date: 06/05/2016 CLINICAL DATA:  Initial evaluation acute fever for 2 days. EXAM: CHEST  2 VIEW COMPARISON:  Prior radiograph from 06/14/2014. FINDINGS: Cardiac and mediastinal silhouettes are stable in size and contour, and remain within normal limits.  Lungs are hypoinflated. Patchy bibasilar opacities favored to reflect associated atelectasis, although small infectious infiltrate could be considered in the correct clinical setting. Lungs are otherwise clear. No pulmonary edema or pleural effusion. No pneumothorax. No acute osseous abnormality. IMPRESSION: Shallow lung inflation with associated patchy bibasilar opacities. Atelectasis is favored, although small and/or developing infectious infiltrates could be considered in the correct clinical setting. Electronically Signed   By: Jeannine Boga M.D.   On: 06/05/2016 22:56  Dg Ankle Complete Left  Result Date: 06/05/2016 CLINICAL DATA:  Intermittent left ankle pain and swelling since last week. No known injury. EXAM: LEFT ANKLE COMPLETE - 3+ VIEW COMPARISON:  None. FINDINGS: Mild soft tissue swelling about the lateral malleolus. No associated fracture or dislocation.  Joint spaces are preserved. Ankle mortise is preserved. Small ankle joint effusion. Note is made of a os trigonum. Small plantar calcaneal spur. IMPRESSION: 1. Small ankle joint effusion and minimal amount of soft tissue swelling about the lateral malleolus without associated fracture or dislocation. 2. Small plantar calcaneal spur. Electronically Signed   By: Sandi Mariscal M.D.   On: 06/05/2016 22:56   Procedures Procedures   Medications Ordered in ED Medications  sodium chloride 0.9 % bolus 1,000 mL (not administered)    And  sodium chloride 0.9 % bolus 1,000 mL (1,000 mLs Intravenous New Bag/Given 06/05/16 2239)    And  sodium chloride 0.9 % bolus 1,000 mL (1,000 mLs Intravenous New Bag/Given 06/05/16 2239)    And  sodium chloride 0.9 % bolus 500 mL (not administered)  morphine 4 MG/ML injection 6 mg (0 mg Intravenous Hold 06/05/16 2240)  ondansetron (ZOFRAN) injection 4 mg (0 mg Intravenous Hold 06/05/16 2240)  vancomycin (VANCOCIN) 2,000 mg in sodium chloride 0.9 % 500 mL IVPB (not administered)  vancomycin (VANCOCIN) 1,500  mg in sodium chloride 0.9 % 500 mL IVPB (not administered)  piperacillin-tazobactam (ZOSYN) IVPB 3.375 g (not administered)  acetaminophen (TYLENOL) tablet 650 mg (not administered)  piperacillin-tazobactam (ZOSYN) IVPB 3.375 g (3.375 g Intravenous New Bag/Given 06/05/16 2255)     Initial Impression / Assessment and Plan / ED Course  I have reviewed the triage vital signs and the nursing notes.  Pertinent labs & imaging results that were available during my care of the patient were reviewed by me and considered in my medical decision making (see chart for details).  Clinical Course    BP 146/90 (BP Location: Left Arm)   Pulse 115   Temp 101.9 F (38.8 C) (Rectal)   Resp 16   Ht 6' (1.829 m)   Wt 104.3 kg   SpO2 99%   BMI 31.19 kg/m    Final Clinical Impressions(s) / ED Diagnoses   Final diagnoses:  Pain and swelling of left ankle    New Prescriptions New Prescriptions   No medications on file   I personally performed the services described in this documentation, which was scribed in my presence. The recorded information has been reviewed and is accurate.     10:22 PM Pt presents with pain and redness to L ankle.  He also is tachycardic and have an elevated temp of 101.9. Pt with sxs concerning for cellulitis of L ankle vs. Septic joint.  Due to the cellulitic skin changes, I do not feel comfortable performing a joint aspiration.  Code Sepsis initiated, abx and IVF started.  Care discussed with Dr. Alfonse Spruce and with Elayne Guerin, PA-C who will continue with pt's care.    11:14 PM I have consulted oncall orthopedist Dr. Marlou Sa who recommend L ankle MRI with CM tomorrow for further evaluation.  I plan on consulting medicine for admission.  Although pt does not have a leukocytosis, his lactic acid if 2.8.  CBG of 274, likely reflect undiagnosed diabetes.  No anion gap concerning for DKA.  CXR showing patchy opacity likely atelectasis and less likely pna.  However, I believe his  infection is L ankle cellulitis/abscess and less likely pna.  Will continue with Vanc/zysyn abx and 4ml/kg IVF.  Tylenol for fever.     11:24 PM Appreciate consultation from [facility] hospitalist Dr. Hal Hope who agrees to see pt in the ER.  He will admit pt to telemetry floor under his care.  Plan to repeat lactic acid  after IVF hydration.  Pt aware of plan and agrees with plan.    CRITICAL CARE Performed by: Domenic Moras Total critical care time: 45 minutes Critical care time was exclusive of separately billable procedures and treating other patients. Critical care was necessary to treat or prevent imminent or life-threatening deterioration. Critical care was time spent personally by me on the following activities: development of treatment plan with patient and/or surrogate as well as nursing, discussions with consultants, evaluation of patient's response to treatment, examination of patient, obtaining history from patient or surrogate, ordering and performing treatments and interventions, ordering and review of laboratory studies, ordering and review of radiographic studies, pulse oximetry and re-evaluation of patient's condition.    Domenic Moras, PA-C 06/05/16 [place] Nguyen, MD 06/06/16 [phone]

## 2016-06-05 NOTE — ED Notes (Signed)
Called [facility] to activate code sepsis  

## 2016-06-05 NOTE — ED Triage Notes (Signed)
Patient reports intermittent left ankle pain with mild swelling onset last week , denies injury .

## 2016-06-05 NOTE — Progress Notes (Signed)
Pharmacy Antibiotic Note  Joshua Bean is a 40 y.o. male admitted on 06/05/2016 with lower extremity cellulitis.  Pharmacy has been consulted for Vancomycin/Zosyn dosing. WBC WNL. Renal function good. X-ray with small joint effusion.   Plan: -Vancomycin 2000 mg IV x 1, then give 1500 mg IV q12h -Zosyn 3.375G IV q8h to be infused over 4 hours -Trend WBC, temp, renal function  -Drug levels as indicated   Height: 6' (182.9 cm) Weight: 230 lb (104.3 kg) IBW/kg (Calculated) : 77.6  Temp (24hrs), Avg:100.8 F (38.2 C), Min:99.7 F (37.6 C), Max:101.9 F (38.8 C)   Recent Labs Lab 06/05/16 2210 06/05/16 2249  WBC 8.6  --   CREATININE 0.83  --   LATICACIDVEN  --  2.80*    Estimated Creatinine Clearance: 149.2 mL/min (by C-G formula based on SCr of 0.83 mg/dL).    Allergies  Allergen Reactions  . Bee Venom Anaphylaxis    Joshua Bean 06/05/2016 11:04 PM

## 2016-06-06 ENCOUNTER — Encounter (HOSPITAL_COMMUNITY): Payer: Self-pay | Admitting: General Practice

## 2016-06-06 ENCOUNTER — Inpatient Hospital Stay (HOSPITAL_COMMUNITY): Payer: 59

## 2016-06-06 ENCOUNTER — Observation Stay (HOSPITAL_COMMUNITY): Payer: 59

## 2016-06-06 DIAGNOSIS — R739 Hyperglycemia, unspecified: Secondary | ICD-10-CM | POA: Diagnosis not present

## 2016-06-06 DIAGNOSIS — L03116 Cellulitis of left lower limb: Secondary | ICD-10-CM | POA: Diagnosis not present

## 2016-06-06 LAB — COMPREHENSIVE METABOLIC PANEL
ALT: 15 U/L — ABNORMAL LOW (ref 17–63)
AST: 12 U/L — ABNORMAL LOW (ref 15–41)
Albumin: 2.9 g/dL — ABNORMAL LOW (ref 3.5–5.0)
Alkaline Phosphatase: 41 U/L (ref 38–126)
Anion gap: 6 (ref 5–15)
BUN: 8 mg/dL (ref 6–20)
CO2: 24 mmol/L (ref 22–32)
Calcium: 8 mg/dL — ABNORMAL LOW (ref 8.9–10.3)
Chloride: 111 mmol/L (ref 101–111)
Creatinine, Ser: 0.74 mg/dL (ref 0.61–1.24)
GFR calc Af Amer: >60 mL/min (ref >60)
GFR calc non Af Amer: >60 mL/min (ref >60)
Glucose, Bld: 133 mg/dL — ABNORMAL HIGH (ref 65–99)
Potassium: 3.8 mmol/L (ref 3.5–5.1)
Sodium: 141 mmol/L (ref 135–145)
Total Bilirubin: 0.8 mg/dL (ref 0.3–1.2)
Total Protein: 5.1 g/dL — ABNORMAL LOW (ref 6.5–8.1)

## 2016-06-06 LAB — CBC
HCT: 36.8 % — ABNORMAL LOW (ref 39.0–52.0)
Hemoglobin: 12.3 g/dL — ABNORMAL LOW (ref 13.0–17.0)
MCH: 30.9 pg (ref 26.0–34.0)
MCHC: 33.4 g/dL (ref 30.0–36.0)
MCV: 92.5 fL (ref 78.0–100.0)
Platelets: 188 10*3/uL (ref 150–400)
RBC: 3.98 MIL/uL — ABNORMAL LOW (ref 4.22–5.81)
RDW: 13 % (ref 11.5–15.5)
WBC: 7.5 10*3/uL (ref 4.0–10.5)

## 2016-06-06 LAB — I-STAT CG4 LACTIC ACID, ED: Lactic Acid, Venous: 2.29 mmol/L (ref 0.5–1.9)

## 2016-06-06 LAB — GLUCOSE, CAPILLARY
Glucose-Capillary: 129 mg/dL — ABNORMAL HIGH (ref 65–99)
Glucose-Capillary: 174 mg/dL — ABNORMAL HIGH (ref 65–99)
Glucose-Capillary: 146 mg/dL — ABNORMAL HIGH (ref 65–99)
Glucose-Capillary: 96 mg/dL (ref 65–99)

## 2016-06-06 LAB — CBG MONITORING, ED: Glucose-Capillary: 150 mg/dL — ABNORMAL HIGH (ref 65–99)

## 2016-06-06 LAB — MRSA PCR SCREENING: MRSA by PCR: NEGATIVE

## 2016-06-06 LAB — LACTIC ACID, PLASMA
Lactic Acid, Venous: 2.5 mmol/L (ref 0.5–1.9)
Lactic Acid, Venous: 2.2 mmol/L (ref 0.5–1.9)

## 2016-06-06 LAB — STREP PNEUMONIAE URINARY ANTIGEN: Strep Pneumo Urinary Antigen: NEGATIVE

## 2016-06-06 LAB — PROCALCITONIN: Procalcitonin: <0.1 ng/mL

## 2016-06-06 LAB — SEDIMENTATION RATE: Sed Rate: 5 mm/hr (ref 0–16)

## 2016-06-06 MED ORDER — SIMVASTATIN 20 MG PO TABS
20.0000 mg | ORAL_TABLET | Freq: Every day | ORAL | Status: DC
  Administered 2016-06-06 – 2016-06-07 (×3): 20 mg via ORAL
  Filled 2016-06-06 (×3): qty 1

## 2016-06-06 MED ORDER — SODIUM CHLORIDE 0.9 % IV BOLUS (SEPSIS)
1000.0000 mL | Freq: Once | INTRAVENOUS | Status: AC
  Administered 2016-06-06: 1000 mL via INTRAVENOUS

## 2016-06-06 MED ORDER — MORPHINE SULFATE (PF) 2 MG/ML IV SOLN
1.0000 mg | INTRAVENOUS | Status: DC | PRN
  Administered 2016-06-06 (×2): 1 mg via INTRAVENOUS
  Filled 2016-06-06 (×2): qty 1

## 2016-06-06 MED ORDER — PIPERACILLIN-TAZOBACTAM 3.375 G IVPB
3.3750 g | Freq: Three times a day (TID) | INTRAVENOUS | Status: DC
  Administered 2016-06-06 – 2016-06-08 (×7): 3.375 g via INTRAVENOUS
  Filled 2016-06-06 (×9): qty 50

## 2016-06-06 MED ORDER — VANCOMYCIN HCL IN DEXTROSE 1-5 GM/200ML-% IV SOLN
1000.0000 mg | Freq: Three times a day (TID) | INTRAVENOUS | Status: DC
  Administered 2016-06-06 – 2016-06-08 (×6): 1000 mg via INTRAVENOUS
  Filled 2016-06-06 (×8): qty 200

## 2016-06-06 MED ORDER — VANCOMYCIN HCL IN DEXTROSE 1-5 GM/200ML-% IV SOLN
1000.0000 mg | Freq: Three times a day (TID) | INTRAVENOUS | Status: DC
  Filled 2016-06-06: qty 200

## 2016-06-06 MED ORDER — SODIUM CHLORIDE 0.9 % IV SOLN
INTRAVENOUS | Status: AC
  Administered 2016-06-06: 01:00:00 via INTRAVENOUS

## 2016-06-06 MED ORDER — INSULIN ASPART 100 UNIT/ML ~~LOC~~ SOLN
0.0000 [IU] | SUBCUTANEOUS | Status: DC
  Administered 2016-06-06 (×2): 2 [IU] via SUBCUTANEOUS
  Administered 2016-06-06: 1 [IU] via SUBCUTANEOUS
  Administered 2016-06-06: 3 [IU] via SUBCUTANEOUS
  Administered 2016-06-07 (×2): 2 [IU] via SUBCUTANEOUS
  Administered 2016-06-07: 3 [IU] via SUBCUTANEOUS
  Administered 2016-06-07: 1 [IU] via SUBCUTANEOUS
  Administered 2016-06-07 – 2016-06-08 (×2): 2 [IU] via SUBCUTANEOUS
  Administered 2016-06-08: 1 [IU] via SUBCUTANEOUS
  Administered 2016-06-08: 3 [IU] via SUBCUTANEOUS
  Administered 2016-06-08: 1 [IU] via SUBCUTANEOUS

## 2016-06-06 MED ORDER — ONDANSETRON HCL 4 MG PO TABS: 4.0000 mg | ORAL_TABLET | Freq: Four times a day (QID) | ORAL | Status: DC | PRN

## 2016-06-06 MED ORDER — CYCLOBENZAPRINE HCL 5 MG PO TABS: 5.0000 mg | ORAL_TABLET | Freq: Three times a day (TID) | ORAL | Status: DC | PRN

## 2016-06-06 MED ORDER — ALPRAZOLAM 0.5 MG PO TABS: 0.5000 mg | ORAL_TABLET | Freq: Every day | ORAL | Status: DC | PRN

## 2016-06-06 MED ORDER — ACETAMINOPHEN 650 MG RE SUPP: 650.0000 mg | Freq: Four times a day (QID) | RECTAL | Status: DC | PRN

## 2016-06-06 MED ORDER — GADOBENATE DIMEGLUMINE 529 MG/ML IV SOLN
20.0000 mL | Freq: Once | INTRAVENOUS | Status: AC | PRN
  Administered 2016-06-06: 20 mL via INTRAVENOUS

## 2016-06-06 MED ORDER — ONDANSETRON HCL 4 MG/2ML IJ SOLN: 4.0000 mg | Freq: Four times a day (QID) | INTRAMUSCULAR | Status: DC | PRN

## 2016-06-06 MED ORDER — DEXTROSE 5 % IV SOLN
500.0000 mg | Freq: Every day | INTRAVENOUS | Status: DC
  Administered 2016-06-06 – 2016-06-08 (×3): 500 mg via INTRAVENOUS
  Filled 2016-06-06 (×4): qty 500

## 2016-06-06 MED ORDER — ACETAMINOPHEN 325 MG PO TABS
650.0000 mg | ORAL_TABLET | Freq: Four times a day (QID) | ORAL | Status: DC | PRN
  Administered 2016-06-06 – 2016-06-08 (×4): 650 mg via ORAL
  Filled 2016-06-06 (×4): qty 2

## 2016-06-06 NOTE — H&P (Addendum)
History and Physical    Joshua Bean [id number] DOB: [birth year] DOA: 06/05/2016  PCP: Webb Silversmith, NP  Patient coming from: Home.  Chief Complaint: Left ankle pain and swelling.  HPI: Joshua Bean is a 40 y.o. male with hypertriglyceridemia presents to the ER because of increasing pain and swelling of the left ankle. Patient's pain started 1 week ago and over the last 3 days pain worsened with fever and chills. Denies any trauma or insect bite. In the ER patient was tachycardic with elevated lactate levels and was given fluid bolus followed which is heart rate improved. Blood cultures were obtained and x-ray showed possible fluid in the joint. On exam patient has tenderness and swelling in the left ankle. Most of the swelling is in the lateral aspect. On call orthopedic surgeon Dr. Marlou Sa was consulted by ER physician who requested to get MRI to further assess the joint for possible septic arthritis. Patient is being admitted for possible sepsis from cellulitis versus septic arthritis. In addition patient blood sugar was found to be elevated and patient does not have a history of diabetes. Patient chest x-ray shows possible pneumonia but patient has no clinical features for pneumonia including productive cough or chest pain.   ED Course: Blood cultures were obtained fluid bolus was given an empiric antibiotics were started. Orthopedic surgery Dr. Marlou Sa was consulted.  Review of Systems: As per HPI, rest all negative.   Past Medical History:  Diagnosis Date  . Anxiety   . Chicken pox   . Skin cancer of face     Past Surgical History:  Procedure Laterality Date  . BACK SURGERY    . Neck surgery       reports that he has never smoked. His smokeless tobacco use includes Chew. He reports that he drinks alcohol. He reports that he does not use drugs.  Allergies  Allergen Reactions  . Bee Venom Anaphylaxis    Family History  Problem Relation Age of Onset  . Heart disease Mother    . Diabetes Mother   . Parkinson's disease Mother   . Heart disease Father   . Diabetes Father   . Cancer Neg Hx   . Stroke Neg Hx     Prior to Admission medications   Medication Sig Start Date End Date Taking? Authorizing Provider  ALPRAZolam Duanne Moron) 0.5 MG tablet Take 1 tablet (0.5 mg total) by mouth daily as needed for anxiety. 03/29/16   Jearld Fenton, NP  cyclobenzaprine (FLEXERIL) 10 MG tablet Take 5 mg by mouth as needed for muscle spasms.    Historical Provider, MD  simvastatin (ZOCOR) 20 MG tablet Take 1 tablet (20 mg total) by mouth at bedtime. 04/04/16   Jearld Fenton, NP  triamcinolone (NASACORT) 55 MCG/ACT AERO nasal inhaler Place 2 sprays into the nose daily.    Historical Provider, MD    Physical Exam: Vitals:   06/05/16 2135 06/05/16 2212 06/05/16 2255  BP: 146/90    Pulse: 115    Resp: 16    Temp: 99.7 F (37.6 C) 101.9 F (38.8 C)   TempSrc: Oral Rectal   SpO2: 99%    Weight: 230 lb (104.3 kg)  230 lb (104.3 kg)  Height: 6' (1.829 m)  6' (1.829 m)      Constitutional: Not in distress. Vitals:   06/05/16 2135 06/05/16 2212 06/05/16 2255  BP: 146/90    Pulse: 115    Resp: 16    Temp: 99.7 F (  37.6 C) 101.9 F (38.8 C)   TempSrc: Oral Rectal   SpO2: 99%    Weight: 230 lb (104.3 kg)  230 lb (104.3 kg)  Height: 6' (1.829 m)  6' (1.829 m)   Eyes: Anicteric no pallor. ENMT: No discharge from the ears eyes nose and mouth. Neck: No mass felt. No JVD appreciated. Respiratory: No rhonchi or crepitations. Cardiovascular: S1 and S2 heard. Abdomen: Soft nontender bowel sounds present. Musculoskeletal: Left ankle is swollen with tenderness and patient has pain on moving his left ankle. Skin: Erythema of the left ankle on the lateral aspect. Neurologic: Alert awake oriented to time place and person. Moves all extremities. Psychiatric: Appears normal.   Labs on Admission: I have personally reviewed following labs and imaging studies  CBC:  Recent  Labs Lab 06/05/16 2210  WBC 8.6  NEUTROABS 5.2  HGB 14.6  HCT 42.7  MCV 93.0  PLT [id number]   Basic Metabolic Panel:  Recent Labs Lab 06/05/16 2210  NA 136  K 3.9  CL 105  CO2 23  GLUCOSE 274*  BUN 10  CREATININE 0.83  CALCIUM 9.2   GFR: Estimated Creatinine Clearance: 149.2 mL/min (by C-G formula based on SCr of 0.83 mg/dL). Liver Function Tests:  Recent Labs Lab 06/05/16 2210  AST 17  ALT 19  ALKPHOS 61  BILITOT 0.3  PROT 6.6  ALBUMIN 3.7   No results for input(s): LIPASE, AMYLASE in the last 168 hours. No results for input(s): AMMONIA in the last 168 hours. Coagulation Profile: No results for input(s): INR, PROTIME in the last 168 hours. Cardiac Enzymes: No results for input(s): CKTOTAL, CKMB, CKMBINDEX, TROPONINI in the last 168 hours. BNP (last 3 results) No results for input(s): PROBNP in the last 8760 hours. HbA1C: No results for input(s): HGBA1C in the last 72 hours. CBG: No results for input(s): GLUCAP in the last 168 hours. Lipid Profile: No results for input(s): CHOL, HDL, LDLCALC, TRIG, CHOLHDL, LDLDIRECT in the last 72 hours. Thyroid Function Tests: No results for input(s): TSH, T4TOTAL, FREET4, T3FREE, THYROIDAB in the last 72 hours. Anemia Panel: No results for input(s): VITAMINB12, FOLATE, FERRITIN, TIBC, IRON, RETICCTPCT in the last 72 hours. Urine analysis: No results found for: COLORURINE, APPEARANCEUR, LABSPEC, PHURINE, GLUCOSEU, HGBUR, BILIRUBINUR, KETONESUR, PROTEINUR, UROBILINOGEN, NITRITE, LEUKOCYTESUR Sepsis Labs: @LABRCNTIP (procalcitonin:4,lacticidven:4) )No results found for this or any previous visit (from the past 240 hour(s)).   Radiological Exams on Admission: Dg Chest 2 View  Result Date: 06/05/2016 CLINICAL DATA:  Initial evaluation acute fever for 2 days. EXAM: CHEST  2 VIEW COMPARISON:  Prior radiograph from 06/14/2014. FINDINGS: Cardiac and mediastinal silhouettes are stable in size and contour, and remain within normal  limits. Lungs are hypoinflated. Patchy bibasilar opacities favored to reflect associated atelectasis, although small infectious infiltrate could be considered in the correct clinical setting. Lungs are otherwise clear. No pulmonary edema or pleural effusion. No pneumothorax. No acute osseous abnormality. IMPRESSION: Shallow lung inflation with associated patchy bibasilar opacities. Atelectasis is favored, although small and/or developing infectious infiltrates could be considered in the correct clinical setting. Electronically Signed   By: Jeannine Boga M.D.   On: 06/05/2016 22:56  Dg Ankle Complete Left  Result Date: 06/05/2016 CLINICAL DATA:  Intermittent left ankle pain and swelling since last week. No known injury. EXAM: LEFT ANKLE COMPLETE - 3+ VIEW COMPARISON:  None. FINDINGS: Mild soft tissue swelling about the lateral malleolus. No associated fracture or dislocation. Joint spaces are preserved. Ankle mortise is preserved. Small ankle joint  effusion. Note is made of a os trigonum. Small plantar calcaneal spur. IMPRESSION: 1. Small ankle joint effusion and minimal amount of soft tissue swelling about the lateral malleolus without associated fracture or dislocation. 2. Small plantar calcaneal spur. Electronically Signed   By: Sandi Mariscal M.D.   On: 06/05/2016 22:56    Assessment/Plan Principal Problem:   Cellulitis of left ankle Active Problems:   HLD (hyperlipidemia)   Hyperglycemia   Cellulitis    1. Possible developing sepsis from cellulitis versus septic arthritis of the left ankle - patient has been placed on empiric antibiotics vancomycin and Zosyn after blood cultures were obtained. Orthopedic surgeon Dr. Marlou Sa was consulted by the ER physician who has requested MRI to further assess patient's left ankle joint. Patient will be kept nothing by mouth past midnight in anticipation of possible procedure for the left ankle by the orthopedic surgery. Continue hydration and follow lactate  procalcitonin levels and cultures. 2. Hyperglycemia probably new onset diabetes mellitus type 2 - check hemoglobin A1c. For now I have placed patient on sliding scale coverage. Follow metabolic panel closely. 3. History of hypertriglyceridemia/hyperlipidemia on statins. 4. Possible pneumonia - chest x-ray shows possible infiltrates. For now patient has been also placed on Zithromax. Check urine Legionella and strep antigen and if negative may discontinue Zithromax. Patient is on vancomycin and Zosyn for #1.   DVT prophylaxis: SCDs in anticipation of procedure. Code Status: Full code.  Family Communication: No family at the bedside.  Disposition Plan: Home.  Consults called: Orthopedic surgery was consulted by ER physician.  Admission status: Inpatient. Telemetry.    Rise Patience MD [facility] Pager [phone].  If 7PM-7AM, please contact night-coverage [email or web address] Password [facility]  06/06/2016, 12:04 AM

## 2016-06-06 NOTE — Progress Notes (Signed)
40 year old male with hypertriglyceridemia admitted for sepsis from left ankle arthritis, orthopedics consulted, recommended MRI of the ankle showing inflammatory vs infectious synovitis .  Plan for IR guided aspiration of the joint and send fluid for analysis.  Please see Dr Moise Boring note for detailed H&P.   Hosie Poisson, MD [phone]

## 2016-06-06 NOTE — Progress Notes (Signed)
Pt not seen yet Tn subtalar and tibiotalar joint with synovitis Could be inflammatory vs infectious - multiple joints more common with inflammatory  Will see today rec IR aspiration of ankle joint send for gs,cell count crystals and culture

## 2016-06-06 NOTE — Progress Notes (Signed)
Pharmacy Antibiotic Note  Joshua Bean is a 40 y.o. male admitted on 06/05/2016 with lower extremity cellulitis.  Pharmacy has been consulted for Vancomycin/Zosyn dosing. WBC WNL. Renal function good. X-ray with small joint effusion.   Plan: -Adjust Vancomycin to 1000 mg IV q8h -Continue Zosyn 3.375G IV q8h to be infused over 4 hours -Monitor clinical progress, c/s, renal function, abx plan/LOT VT@SS  as indicated  Height: 6' (182.9 cm) Weight: 226 lb 3.2 oz (102.6 kg) IBW/kg (Calculated) : 77.6  Temp (24hrs), Avg:99.8 F (37.7 C), Min:99 F (37.2 C), Max:101.9 F (38.8 C)   Recent Labs Lab 06/05/16 2210 06/05/16 2249 06/06/16 0052 06/06/16 0456 06/06/16 1039  WBC 8.6  --   --  7.5  --   CREATININE 0.83  --   --  0.74  --   LATICACIDVEN  --  2.80* 2.29*  --  2.5*    Estimated Creatinine Clearance: 153.6 mL/min (by C-G formula based on SCr of 0.8 mg/dL).    Allergies  Allergen Reactions  . Bee Venom Anaphylaxis    Thank you for allowing [place] to participate in this patients care. Jens Som, PharmD Phone [phone] 06/06/2016 11:44 AM

## 2016-06-06 NOTE — Progress Notes (Signed)
CRITICAL VALUE ALERT  Critical value received:  2.5 Lactic Acid  Date of notification:  06/06/2016  Time of notification:  [id number]  Critical value read back:Yes.    Nurse who received alert:  Haywood Lasso  MD notified (1st page):  Dr Karleen Hampshire  Time of first page:  [phone]  MD notified (2nd page):  Time of second page:  Responding MD:  Karleen Hampshire  Time MD responded: 1200

## 2016-06-06 NOTE — ED Notes (Signed)
Report given to Tina, RN.  

## 2016-06-07 ENCOUNTER — Observation Stay (HOSPITAL_COMMUNITY): Payer: 59

## 2016-06-07 DIAGNOSIS — E119 Type 2 diabetes mellitus without complications: Secondary | ICD-10-CM

## 2016-06-07 DIAGNOSIS — J189 Pneumonia, unspecified organism: Secondary | ICD-10-CM | POA: Diagnosis present

## 2016-06-07 DIAGNOSIS — R739 Hyperglycemia, unspecified: Secondary | ICD-10-CM | POA: Diagnosis not present

## 2016-06-07 DIAGNOSIS — L03116 Cellulitis of left lower limb: Secondary | ICD-10-CM | POA: Diagnosis not present

## 2016-06-07 LAB — SYNOVIAL CELL COUNT + DIFF, W/ CRYSTALS
Lymphocytes-Synovial Fld: 7 % (ref 0–20)
Monocyte-Macrophage-Synovial Fluid: 5 % — ABNORMAL LOW (ref 50–90)
Neutrophil, Synovial: 88 % — ABNORMAL HIGH (ref 0–25)
WBC, Synovial: 3460 /mm3 — ABNORMAL HIGH (ref 0–200)

## 2016-06-07 LAB — LEGIONELLA PNEUMOPHILA SEROGP 1 UR AG: L. pneumophila Serogp 1 Ur Ag: NEGATIVE

## 2016-06-07 LAB — GLUCOSE, CAPILLARY
Glucose-Capillary: 120 mg/dL — ABNORMAL HIGH (ref 65–99)
Glucose-Capillary: 146 mg/dL — ABNORMAL HIGH (ref 65–99)
Glucose-Capillary: 165 mg/dL — ABNORMAL HIGH (ref 65–99)
Glucose-Capillary: 166 mg/dL — ABNORMAL HIGH (ref 65–99)
Glucose-Capillary: 193 mg/dL — ABNORMAL HIGH (ref 65–99)
Glucose-Capillary: 242 mg/dL — ABNORMAL HIGH (ref 65–99)
Glucose-Capillary: 245 mg/dL — ABNORMAL HIGH (ref 65–99)

## 2016-06-07 LAB — LACTIC ACID, PLASMA: Lactic Acid, Venous: 1.8 mmol/L (ref 0.5–1.9)

## 2016-06-07 LAB — HEMOGLOBIN A1C
Hgb A1c MFr Bld: 7 % — ABNORMAL HIGH (ref 4.8–5.6)
Mean Plasma Glucose: 154 mg/dL

## 2016-06-07 MED ORDER — OXYCODONE-ACETAMINOPHEN 5-325 MG PO TABS
1.0000 | ORAL_TABLET | ORAL | Status: DC | PRN
  Administered 2016-06-07: 2 via ORAL
  Filled 2016-06-07: qty 2

## 2016-06-07 MED ORDER — IOPAMIDOL (ISOVUE-M 200) INJECTION 41%
INTRAMUSCULAR | Status: AC
  Administered 2016-06-07: 5 mL via INTRA_ARTICULAR
  Filled 2016-06-07: qty 10

## 2016-06-07 NOTE — Progress Notes (Signed)
PROGRESS NOTE    Joshua Bean  [id number] DOB: [birth year] DOA: 06/05/2016 PCP: Webb Silversmith, NP    Brief Narrative: Joshua Bean is a 40 y.o. male with hypertriglyceridemia presents to the ER because of increasing pain and swelling of the left ankle. Patient's pain started 1 week ago and over the last 3 days pain worsened with fever and chills  Assessment & Plan:   Principal Problem:   Cellulitis of left ankle Active Problems:   HLD (hyperlipidemia)   Hyperglycemia   Cellulitis   Left ankle cellulitis? Gouty attack ? Septic arthritis: Underwent MRI of the left ankle, requested Dr Marlou Sa to see the patient, suggested getting DG fluoro ankle aspiration, whch was done on 7/25 , showed grossly sterile fluid and analysis is pending.  For now continue with IV antibiotics and further recommendations to follow from orthopedics.    ? Community acquired pneumonia: On IV antibiotics.  - urine for legionella and streptococcal antigen is negative.  - blood cultures are pending and negative so far.  - on RA with good oxygen sats.  - afebrile this am, wbc count is normal.  - trend lactic acid.  - pro calcitonin level Is negative/ normal.   Newly diagnosed DM: hgba1c is 7.  Resume SSI.  Get diabetes co ordinator  Consult and DM education, can start on metformin on discharge.,     DVT prophylaxis: SCD'S  Code Status: (Full) Family Communication: NONE at bedside.  Disposition Plan: pending further evaluation.    Consultants:   Orthopedics  IR   Procedures: FLURO ankle aspiration.    Antimicrobials:  IV vancomycin  iV zosyn IV azithromycin. 7/23  Subjective: Severe pain in the left ankle, pain not relived with current pain regimen.  Objective: Vitals:   06/06/16 2011 06/07/16 0422 06/07/16 0954 06/07/16 1708  BP: 125/64 111/69 109/72 114/70  Pulse: (!) 111 85 95 98  Resp: 20 20 20 18   Temp: 100.1 F (37.8 C) 98.9 F (37.2 C) 99.7 F (37.6 C) 98.6 F (37 C)   TempSrc: Oral Oral Oral Oral  SpO2: 97% 98% 97% 98%  Weight: 104.4 kg (230 lb 1.6 oz)     Height:        Intake/Output Summary (Last 24 hours) at 06/07/16 1817 Last data filed at 06/07/16 1606  Gross per 24 hour  Intake             1060 ml  Output             3375 ml  Net            -2315 ml   Filed Weights   06/05/16 2255 06/06/16 0254 06/06/16 2011  Weight: 104.3 kg (230 lb) 102.6 kg (226 lb 3.2 oz) 104.4 kg (230 lb 1.6 oz)    Examination:  General exam: mild distress from ankle pain. Respiratory system: Clear to auscultation. Respiratory effort normal. Cardiovascular system: S1 & S2 heard, RRR. No JVD, murmurs, rubs, gallops or clicks.  Gastrointestinal system: Abdomen is nondistended, soft and nontender. No organomegaly or masses felt. Normal bowel sounds heard. Central nervous system: Alert and oriented. No focal neurological deficits. Extremities: left ankle swollen and tender to touch, erythema improved when compared to yesterday. Skin: No rashes, lesions or ulcers Psychiatry: Judgement and insight appear normal. Mood & affect appropriate.     Data Reviewed: I have personally reviewed following labs and imaging studies  CBC:  Recent Labs Lab 06/05/16 2210 06/06/16 0456  WBC 8.6 7.5  NEUTROABS 5.2  --   HGB 14.6 12.3*  HCT 42.7 36.8*  MCV 93.0 92.5  PLT 221 0000000   Basic Metabolic Panel:  Recent Labs Lab 06/05/16 2210 06/06/16 0456  NA 136 141  K 3.9 3.8  CL 105 111  CO2 23 24  GLUCOSE 274* 133*  BUN 10 8  CREATININE 0.83 0.74  CALCIUM 9.2 8.0*   GFR: Estimated Creatinine Clearance: 154.8 mL/min (by C-G formula based on SCr of 0.8 mg/dL). Liver Function Tests:  Recent Labs Lab 06/05/16 2210 06/06/16 0456  AST 17 12*  ALT 19 15*  ALKPHOS 61 41  BILITOT 0.3 0.8  PROT 6.6 5.1*  ALBUMIN 3.7 2.9*   No results for input(s): LIPASE, AMYLASE in the last 168 hours. No results for input(s): AMMONIA in the last 168 hours. Coagulation  Profile: No results for input(s): INR, PROTIME in the last 168 hours. Cardiac Enzymes: No results for input(s): CKTOTAL, CKMB, CKMBINDEX, TROPONINI in the last 168 hours. BNP (last 3 results) No results for input(s): PROBNP in the last 8760 hours. HbA1C:  Recent Labs  06/06/16 0044  HGBA1C 7.0*   CBG:  Recent Labs Lab 06/07/16 0003 06/07/16 0414 06/07/16 0739 06/07/16 1125 06/07/16 1612  GLUCAP 193* 146* 166* 165* 120*   Lipid Profile: No results for input(s): CHOL, HDL, LDLCALC, TRIG, CHOLHDL, LDLDIRECT in the last 72 hours. Thyroid Function Tests: No results for input(s): TSH, T4TOTAL, FREET4, T3FREE, THYROIDAB in the last 72 hours. Anemia Panel: No results for input(s): VITAMINB12, FOLATE, FERRITIN, TIBC, IRON, RETICCTPCT in the last 72 hours. Sepsis Labs:  Recent Labs Lab 06/05/16 2249 06/06/16 0052 06/06/16 1039 06/06/16 1400  PROCALCITON  --   --  <0.10  --   LATICACIDVEN 2.80* 2.29* 2.5* 2.2*    Recent Results (from the past 240 hour(s))  Blood Culture (routine x 2)     Status: None (Preliminary result)   Collection Time: 06/05/16 10:10 PM  Result Value Ref Range Status   Specimen Description BLOOD RIGHT ANTECUBITAL  Final   Special Requests IN PEDIATRIC BOTTLE 4CC  Final   Culture NO GROWTH 2 DAYS  Final   Report Status PENDING  Incomplete  Blood Culture (routine x 2)     Status: None (Preliminary result)   Collection Time: 06/05/16 10:55 PM  Result Value Ref Range Status   Specimen Description BLOOD RIGHT HAND  Final   Special Requests IN PEDIATRIC BOTTLE 4CC  Final   Culture NO GROWTH 2 DAYS  Final   Report Status PENDING  Incomplete  MRSA PCR Screening     Status: None   Collection Time: 06/06/16  4:07 AM  Result Value Ref Range Status   MRSA by PCR NEGATIVE NEGATIVE Final    Comment:        The GeneXpert MRSA Assay (FDA approved for NASAL specimens only), is one component of a comprehensive MRSA colonization surveillance program. It is  not intended to diagnose MRSA infection nor to guide or monitor treatment for MRSA infections.          Radiology Studies: Dg Chest 2 View  Result Date: 06/05/2016 CLINICAL DATA:  Initial evaluation acute fever for 2 days. EXAM: CHEST  2 VIEW COMPARISON:  Prior radiograph from 06/14/2014. FINDINGS: Cardiac and mediastinal silhouettes are stable in size and contour, and remain within normal limits. Lungs are hypoinflated. Patchy bibasilar opacities favored to reflect associated atelectasis, although small infectious infiltrate could be considered in the correct clinical setting. Lungs are otherwise  clear. No pulmonary edema or pleural effusion. No pneumothorax. No acute osseous abnormality. IMPRESSION: Shallow lung inflation with associated patchy bibasilar opacities. Atelectasis is favored, although small and/or developing infectious infiltrates could be considered in the correct clinical setting. Electronically Signed   By: Jeannine Boga M.D.   On: 06/05/2016 22:56  Dg Ankle Complete Left  Result Date: 06/05/2016 CLINICAL DATA:  Intermittent left ankle pain and swelling since last week. No known injury. EXAM: LEFT ANKLE COMPLETE - 3+ VIEW COMPARISON:  None. FINDINGS: Mild soft tissue swelling about the lateral malleolus. No associated fracture or dislocation. Joint spaces are preserved. Ankle mortise is preserved. Small ankle joint effusion. Note is made of a os trigonum. Small plantar calcaneal spur. IMPRESSION: 1. Small ankle joint effusion and minimal amount of soft tissue swelling about the lateral malleolus without associated fracture or dislocation. 2. Small plantar calcaneal spur. Electronically Signed   By: Sandi Mariscal M.D.   On: 06/05/2016 22:56  Mr Ankle Left W Wo Contrast  Result Date: 06/06/2016 CLINICAL DATA:  Left ankle pain and swelling.  Fevers, chills. EXAM: MRI OF THE LEFT ANKLE WITHOUT AND WITH CONTRAST TECHNIQUE: Multiplanar, multisequence MR imaging of the ankle  was performed before and after the administration of intravenous contrast. CONTRAST:  20 mL MultiHance COMPARISON:  None. FINDINGS: TENDONS Peroneal: Peroneal longus tendon intact. Peroneal brevis intact. Posteromedial: Posterior tibial tendon intact. Flexor hallucis longus tendon intact. Flexor digitorum longus tendon intact. Anterior: Tibialis anterior tendon intact. Extensor hallucis longus tendon intact Extensor digitorum longus tendon intact. Achilles:  Intact. Plantar Fascia: Intact. LIGAMENTS Lateral: Anterior talofibular ligament intact. Calcaneofibular ligament intact. Posterior talofibular ligament intact. Anterior and posterior tibiofibular ligaments intact. Medial: Deltoid ligament intact. Spring ligament intact. CARTILAGE Ankle Joint: Moderate ankle joint effusion with synovitis. Normal ankle mortise. No chondral defect. Subtalar Joints/Sinus Tarsi: Normal subtalar joints. Subtalar joint effusion with synovitis. Bones: Mild osteoarthritis of the talonavicular joint. Small talonavicular joint effusion and mild synovitis. Soft Tissue: Mild soft tissue swelling around the lateral aspect of the ankle and foot which may be reactive versus secondary to mild cellulitis. IMPRESSION: IMPRESSION 1. Moderate ankle joint effusion with synovitis. Small subtalar joint effusion with synovitis. Small talonavicular joint effusion with synovitis. Differential diagnosis includes an inflammatory arthropathy such as rheumatoid arthritis versus septic arthritis. Electronically Signed   By: Kathreen Devoid   On: 06/06/2016 09:07  Dg Fluoro Guided Needle Plc Aspiration/injection Loc  Result Date: 06/07/2016 CLINICAL DATA:  Left ankle pain with synovitis and effusion on MRI. Evaluate for septic joint or crystalline arthropathy. EXAM: LEFT ANKLE ASPIRATION UNDER FLUOROSCOPY FLUOROSCOPY TIME:  Radiation Exposure Index (as provided by the fluoroscopic device): 32.33 uGy*m2 If the device does not provide the exposure index:  Fluoroscopy Time (in minutes and seconds): 1 minutes and 6 seconds of pulsed fluoro Number of Acquired Images: 1 spot image. Additional images obtained with fluoro store. PROCEDURE: MRI from 06/06/2016 reviewed. Time out procedure was performed. The procedure, risks (including but not limited to bleeding, infection, organ damage ), benefits, and alternatives were explained to the patient. Questions regarding the procedure were encouraged and answered. The patient understands and consents to the procedure. An appropriate skin site was determined fluoroscopically and marked on the patient's skin. Overlying skin prepped with Betadine, draped in the usual sterile fashion, and infiltrated locally with Lidocaine. A 21 gauge hypodermic needle was advanced into the tibiotalar joint using an anteromedial approach, avoiding the tibialis anterior tendon and the dorsalis pedis artery. 4 ml of yellow,  non cloudy fluid was aspirated and sent for the requested studies. Intra-articular position of the needle was confirmed by injecting approximately 5 ml of Isovue 200 into the joint. Spot images were obtained. The patient tolerated the procedure without immediate complications. IMPRESSION: Left ankle aspiration, yielding grossly sterile fluid. Fluid was sent for the requested studies. Electronically Signed   By: Richardean Sale M.D.   On: 06/07/2016 14:48       Scheduled Meds: . azithromycin  500 mg Intravenous Daily  . insulin aspart  0-9 Units Subcutaneous Q4H  . piperacillin-tazobactam (ZOSYN)  IV  3.375 g Intravenous Q8H  . simvastatin  20 mg Oral QHS  . vancomycin  1,000 mg Intravenous Q8H   Continuous Infusions:    LOS: 1 day    Time spent: 35 minutes.     Hosie Poisson, MD [facility] Pager [phone]   If 7PM-7AM, please contact night-coverage [email or web address] Password [facility] 06/07/2016, 6:17 PM

## 2016-06-07 NOTE — Progress Notes (Signed)
Asp positive for gout crystals ankle Treat for gout No surgery indicated

## 2016-06-07 NOTE — Procedures (Signed)
Left ankle aspiration was performed under fluoroscopy.  Clear, yellow fluid, 4 cc aspirated and sent for requested studies.  Details dictated in radiology report.

## 2016-06-07 NOTE — Progress Notes (Signed)
Patient examined this morning.  Patient does have erythema and tenderness on the lateral aspect of the ankle.  Pedal pulses intact no proximal lymphadenopathy is present not much in way of medial sided tenderness is present he has more pain with subtalar motion and tibiotalar motion  By history he describes onset of symptoms several days ago with fever to 102 on Sunday.  MRI scan shows effusion and synovitis within the subtalar tibiotalar and talonavicular joints.  He does state that he has been eating poorly lately because of recent separation  Plan at this time is for ankle aspiration nothing by mouth avoid blood thinners he has been on antibiotics and thus cultures are likely not to grow however if there are no crystals in this fluid in the differential remains between septic arthritis versus inflammatory arthritis.  He has no other joint complaints.  I would favor ankle I&D if no crystals are present on fluid aspiration today  Fluid from the ankle needs to be sent for Gram stain crystals cell count and culture

## 2016-06-08 DIAGNOSIS — M109 Gout, unspecified: Secondary | ICD-10-CM | POA: Diagnosis present

## 2016-06-08 LAB — GLUCOSE, CAPILLARY
Glucose-Capillary: 137 mg/dL — ABNORMAL HIGH (ref 65–99)
Glucose-Capillary: 139 mg/dL — ABNORMAL HIGH (ref 65–99)
Glucose-Capillary: 164 mg/dL — ABNORMAL HIGH (ref 65–99)
Glucose-Capillary: 216 mg/dL — ABNORMAL HIGH (ref 65–99)

## 2016-06-08 LAB — URIC ACID: Uric Acid, Serum: 4.8 mg/dL (ref 4.4–7.6)

## 2016-06-08 LAB — PROCALCITONIN: Procalcitonin: <0.1 ng/mL

## 2016-06-08 MED ORDER — INDOMETHACIN 50 MG PO CAPS
50.0000 mg | ORAL_CAPSULE | Freq: Three times a day (TID) | ORAL | Status: DC
Start: 2016-06-08 — End: 2016-06-08
  Administered 2016-06-08: 50 mg via ORAL
  Filled 2016-06-08 (×2): qty 1

## 2016-06-08 MED ORDER — AMOXICILLIN-POT CLAVULANATE 875-125 MG PO TABS: 1.0000 | ORAL_TABLET | Freq: Two times a day (BID) | ORAL | 0 refills | Status: AC

## 2016-06-08 MED ORDER — LIVING WELL WITH DIABETES BOOK
Freq: Once | Status: AC
  Administered 2016-06-08: 12:00:00
  Filled 2016-06-08: qty 1

## 2016-06-08 MED ORDER — INDOMETHACIN 50 MG PO CAPS: 50.0000 mg | ORAL_CAPSULE | Freq: Three times a day (TID) | ORAL | 0 refills | Status: AC

## 2016-06-08 NOTE — Discharge Summary (Signed)
Physician Discharge Summary  Joshua Bean [id number] DOB: 07-14-1976 DOA: 06/05/2016  PCP: Webb Silversmith, NP  Admit date: 06/05/2016 Discharge date: 06/08/2016  Admitted From: home Disposition:  home  Recommendations for Outpatient Follow-up:  1. Follow up with PCP in 1-2 weeks. Completes antibiotics on 7/28.   Home Health: No Equipment/Devices: None  Discharge Condition: Fair CODE STATUS: Full code Diet recommendation: Carb Modified    Discharge Diagnoses:  Principal Problem:   Acute gouty arthritis   Active Problems:   Obesity (BMI 30-39.9)   Generalized anxiety disorder   HLD (hyperlipidemia)   Hyperglycemia   Diabetes mellitus (HCC)   CAP (community acquired pneumonia)  Brief narrative/history of present illness Please refer to admission H&P for details, in brief, 40 year old obese male with history of hypertriglyceridemia, borderline diabetes presented to the ED with increasing pain and swelling of the left ankle for one week duration, worsened 3 days prior to admission with associated fever and chills. He was found to have bibasilar opacity on chest x-ray and questionable cellulitis of his left ankle. He had an MRI of his left ankle done which showed moderate joint effusion with synovitis. Patient seen by orthopedic consult and underwent aspiration of the joint which showed monosodium urate crystals suggestive of acute gouty arthritis.  Hospital course Acute gouty arthritis [facility]) This is his first ever symptoms. Left foot pain and swelling much improved after joint aspiration. He is able to bear weight with some pain. I will discharge him on oral indomethacin 50 mg 3 times a day for a 5 day course. He can alternate with the use of Tylenol for pain relief. Patient provided instructions on preventing future attacks (this is his first ever attack and may have been triggered by hypertriglyceridemia, obesity and diabetes). Provided dietary resources resources on diet  and avoiding alcohol.  Lobar pneumonia ([place]) Patient reported some cough with fever and chest x-ray findings of bibasilar opacity. Was being treated empirically with Rocephin and azithromycin. I will discharge him on oral Augmentin to complete 5 day course of antibiotics. He is asymptomatic on exam today.  Hypertriglyceridemia Patient was placed on statin by his PCP but has not started taking it due to some insurance issues. I have instructed him to start taking statin.  Anxiety Chronic. Stable. Continue Xanax as needed.  Diabetes mellitus Patient reports being told of having prediabetes. A1c is 7. He reports dietary nonadherence and has not been exercising regularly. Patient instructed on monitoring his diet, getting a glucometer and checking his blood glucose (both fasting and random and keeping a log of it). Also provided him with some resources on meal planning and motivated him to exercise regularly and lose weight.  Patient is stable to be discharged home. He will follow-up with his PCP in 1 week.  Discharge Instructions     Medication List    STOP taking these medications   ibuprofen 200 MG tablet Commonly known as:  ADVIL,MOTRIN     TAKE these medications   ALPRAZolam 0.5 MG tablet Commonly known as:  XANAX Take 1 tablet (0.5 mg total) by mouth daily as needed for anxiety.   amoxicillin-clavulanate 875-125 MG tablet Commonly known as:  AUGMENTIN Take 1 tablet by mouth 2 (two) times daily.   cyclobenzaprine 10 MG tablet Commonly known as:  FLEXERIL Take 5 mg by mouth as needed for muscle spasms.   indomethacin 50 MG capsule Commonly known as:  INDOCIN Take 1 capsule (50 mg total) by mouth 3 (three) times daily  with meals.   simvastatin 20 MG tablet Commonly known as:  ZOCOR Take 1 tablet (20 mg total) by mouth at bedtime.   triamcinolone 55 MCG/ACT Aero nasal inhaler Commonly known as:  NASACORT Place 2 sprays into the nose daily as needed (allergies).        Allergies  Allergen Reactions  . Bee Venom Anaphylaxis    Consultations:  Orthopedics (Dr. Marlou Sa)   Procedures/Studies: Dg Chest 2 View  Result Date: 06/05/2016 CLINICAL DATA:  Initial evaluation acute fever for 2 days. EXAM: CHEST  2 VIEW COMPARISON:  Prior radiograph from 06/14/2014. FINDINGS: Cardiac and mediastinal silhouettes are stable in size and contour, and remain within normal limits. Lungs are hypoinflated. Patchy bibasilar opacities favored to reflect associated atelectasis, although small infectious infiltrate could be considered in the correct clinical setting. Lungs are otherwise clear. No pulmonary edema or pleural effusion. No pneumothorax. No acute osseous abnormality. IMPRESSION: Shallow lung inflation with associated patchy bibasilar opacities. Atelectasis is favored, although small and/or developing infectious infiltrates could be considered in the correct clinical setting. Electronically Signed   By: Jeannine Boga M.D.   On: 06/05/2016 22:56  Dg Ankle Complete Left  Result Date: 06/05/2016 CLINICAL DATA:  Intermittent left ankle pain and swelling since last week. No known injury. EXAM: LEFT ANKLE COMPLETE - 3+ VIEW COMPARISON:  None. FINDINGS: Mild soft tissue swelling about the lateral malleolus. No associated fracture or dislocation. Joint spaces are preserved. Ankle mortise is preserved. Small ankle joint effusion. Note is made of a os trigonum. Small plantar calcaneal spur. IMPRESSION: 1. Small ankle joint effusion and minimal amount of soft tissue swelling about the lateral malleolus without associated fracture or dislocation. 2. Small plantar calcaneal spur. Electronically Signed   By: Sandi Mariscal M.D.   On: 06/05/2016 22:56  Mr Ankle Left W Wo Contrast  Result Date: 06/06/2016 CLINICAL DATA:  Left ankle pain and swelling.  Fevers, chills. EXAM: MRI OF THE LEFT ANKLE WITHOUT AND WITH CONTRAST TECHNIQUE: Multiplanar, multisequence MR imaging of the ankle  was performed before and after the administration of intravenous contrast. CONTRAST:  20 mL MultiHance COMPARISON:  None. FINDINGS: TENDONS Peroneal: Peroneal longus tendon intact. Peroneal brevis intact. Posteromedial: Posterior tibial tendon intact. Flexor hallucis longus tendon intact. Flexor digitorum longus tendon intact. Anterior: Tibialis anterior tendon intact. Extensor hallucis longus tendon intact Extensor digitorum longus tendon intact. Achilles:  Intact. Plantar Fascia: Intact. LIGAMENTS Lateral: Anterior talofibular ligament intact. Calcaneofibular ligament intact. Posterior talofibular ligament intact. Anterior and posterior tibiofibular ligaments intact. Medial: Deltoid ligament intact. Spring ligament intact. CARTILAGE Ankle Joint: Moderate ankle joint effusion with synovitis. Normal ankle mortise. No chondral defect. Subtalar Joints/Sinus Tarsi: Normal subtalar joints. Subtalar joint effusion with synovitis. Bones: Mild osteoarthritis of the talonavicular joint. Small talonavicular joint effusion and mild synovitis. Soft Tissue: Mild soft tissue swelling around the lateral aspect of the ankle and foot which may be reactive versus secondary to mild cellulitis. IMPRESSION: IMPRESSION 1. Moderate ankle joint effusion with synovitis. Small subtalar joint effusion with synovitis. Small talonavicular joint effusion with synovitis. Differential diagnosis includes an inflammatory arthropathy such as rheumatoid arthritis versus septic arthritis. Electronically Signed   By: Kathreen Devoid   On: 06/06/2016 09:07  Dg Fluoro Guided Needle Plc Aspiration/injection Loc  Result Date: 06/07/2016 CLINICAL DATA:  Left ankle pain with synovitis and effusion on MRI. Evaluate for septic joint or crystalline arthropathy. EXAM: LEFT ANKLE ASPIRATION UNDER FLUOROSCOPY FLUOROSCOPY TIME:  Radiation Exposure Index (as provided by the fluoroscopic device):  32.33 uGy*m2 If the device does not provide the exposure index:  Fluoroscopy Time (in minutes and seconds): 1 minutes and 6 seconds of pulsed fluoro Number of Acquired Images: 1 spot image. Additional images obtained with fluoro store. PROCEDURE: MRI from 06/06/2016 reviewed. Time out procedure was performed. The procedure, risks (including but not limited to bleeding, infection, organ damage ), benefits, and alternatives were explained to the patient. Questions regarding the procedure were encouraged and answered. The patient understands and consents to the procedure. An appropriate skin site was determined fluoroscopically and marked on the patient's skin. Overlying skin prepped with Betadine, draped in the usual sterile fashion, and infiltrated locally with Lidocaine. A 21 gauge hypodermic needle was advanced into the tibiotalar joint using an anteromedial approach, avoiding the tibialis anterior tendon and the dorsalis pedis artery. 4 ml of yellow, non cloudy fluid was aspirated and sent for the requested studies. Intra-articular position of the needle was confirmed by injecting approximately 5 ml of Isovue 200 into the joint. Spot images were obtained. The patient tolerated the procedure without immediate complications. IMPRESSION: Left ankle aspiration, yielding grossly sterile fluid. Fluid was sent for the requested studies. Electronically Signed   By: Richardean Sale M.D.   On: 06/07/2016 14:48      Subjective: Left Ankle feels much better. Has mild swelling and some pain. His able to ambulate with some pain.  Discharge Exam: Vitals:   06/07/16 2031 06/08/16 0418  BP: 132/80 100/72  Pulse: (!) 115 84  Resp: 20 18  Temp: 99.2 F (37.3 C) 97.8 F (36.6 C)   Vitals:   06/07/16 0954 06/07/16 1708 06/07/16 2031 06/08/16 0418  BP: 109/72 114/70 132/80 100/72  Pulse: 95 98 (!) 115 84  Resp: 20 18 20 18   Temp: 99.7 F (37.6 C) 98.6 F (37 C) 99.2 F (37.3 C) 97.8 F (36.6 C)  TempSrc: Oral Oral    SpO2: 97% 98% 99% 98%  Weight:      Height:         General: Middle aged male not in distress HEENT: Moist mucosa, supple neck Chest: Clear bilaterally neck slight serous: Normal S1 and S2, no murmurs GI: Soft, nondistended, nontender Musculoskeletal: Minimal warmth and swelling of the left ankle with some painful ROM     The results of significant diagnostics from this hospitalization (including imaging, microbiology, ancillary and laboratory) are listed below for reference.     Microbiology: Recent Results (from the past 240 hour(s))  Blood Culture (routine x 2)     Status: None (Preliminary result)   Collection Time: 06/05/16 10:10 PM  Result Value Ref Range Status   Specimen Description BLOOD RIGHT ANTECUBITAL  Final   Special Requests IN PEDIATRIC BOTTLE 4CC  Final   Culture NO GROWTH 2 DAYS  Final   Report Status PENDING  Incomplete  Blood Culture (routine x 2)     Status: None (Preliminary result)   Collection Time: 06/05/16 10:55 PM  Result Value Ref Range Status   Specimen Description BLOOD RIGHT HAND  Final   Special Requests IN PEDIATRIC BOTTLE 4CC  Final   Culture NO GROWTH 2 DAYS  Final   Report Status PENDING  Incomplete  MRSA PCR Screening     Status: None   Collection Time: 06/06/16  4:07 AM  Result Value Ref Range Status   MRSA by PCR NEGATIVE NEGATIVE Final    Comment:        The GeneXpert MRSA Assay (FDA approved for NASAL  specimens only), is one component of a comprehensive MRSA colonization surveillance program. It is not intended to diagnose MRSA infection nor to guide or monitor treatment for MRSA infections.      Labs: BNP (last 3 results) No results for input(s): BNP in the last 8760 hours. Basic Metabolic Panel:  Recent Labs Lab 06/05/16 2210 06/06/16 0456  NA 136 141  K 3.9 3.8  CL 105 111  CO2 23 24  GLUCOSE 274* 133*  BUN 10 8  CREATININE 0.83 0.74  CALCIUM 9.2 8.0*   Liver Function Tests:  Recent Labs Lab 06/05/16 2210 06/06/16 0456  AST 17 12*  ALT 19 15*   ALKPHOS 61 41  BILITOT 0.3 0.8  PROT 6.6 5.1*  ALBUMIN 3.7 2.9*   No results for input(s): LIPASE, AMYLASE in the last 168 hours. No results for input(s): AMMONIA in the last 168 hours. CBC:  Recent Labs Lab 06/05/16 2210 06/06/16 0456  WBC 8.6 7.5  NEUTROABS 5.2  --   HGB 14.6 12.3*  HCT 42.7 36.8*  MCV 93.0 92.5  PLT 221 188   Cardiac Enzymes: No results for input(s): CKTOTAL, CKMB, CKMBINDEX, TROPONINI in the last 168 hours. BNP: Invalid input(s): POCBNP CBG:  Recent Labs Lab 06/07/16 1612 06/07/16 2027 06/08/16 0001 06/08/16 0420 06/08/16 0745  GLUCAP 120* 242* 137* 139* 164*   D-Dimer No results for input(s): DDIMER in the last 72 hours. Hgb A1c  Recent Labs  06/06/16 0044  HGBA1C 7.0*   Lipid Profile No results for input(s): CHOL, HDL, LDLCALC, TRIG, CHOLHDL, LDLDIRECT in the last 72 hours. Thyroid function studies No results for input(s): TSH, T4TOTAL, T3FREE, THYROIDAB in the last 72 hours.  Invalid input(s): FREET3 Anemia work up No results for input(s): VITAMINB12, FOLATE, FERRITIN, TIBC, IRON, RETICCTPCT in the last 72 hours. Urinalysis No results found for: COLORURINE, APPEARANCEUR, LABSPEC, [place], GLUCOSEU, [place], BILIRUBINUR, KETONESUR, PROTEINUR, UROBILINOGEN, NITRITE, LEUKOCYTESUR Sepsis Labs Invalid input(s): PROCALCITONIN,  WBC,  LACTICIDVEN Microbiology Recent Results (from the past 240 hour(s))  Blood Culture (routine x 2)     Status: None (Preliminary result)   Collection Time: 06/05/16 10:10 PM  Result Value Ref Range Status   Specimen Description BLOOD RIGHT ANTECUBITAL  Final   Special Requests IN PEDIATRIC BOTTLE 4CC  Final   Culture NO GROWTH 2 DAYS  Final   Report Status PENDING  Incomplete  Blood Culture (routine x 2)     Status: None (Preliminary result)   Collection Time: 06/05/16 10:55 PM  Result Value Ref Range Status   Specimen Description BLOOD RIGHT HAND  Final   Special Requests IN PEDIATRIC BOTTLE 4CC  Final    Culture NO GROWTH 2 DAYS  Final   Report Status PENDING  Incomplete  MRSA PCR Screening     Status: None   Collection Time: 06/06/16  4:07 AM  Result Value Ref Range Status   MRSA by PCR NEGATIVE NEGATIVE Final    Comment:        The GeneXpert MRSA Assay (FDA approved for NASAL specimens only), is one component of a comprehensive MRSA colonization surveillance program. It is not intended to diagnose MRSA infection nor to guide or monitor treatment for MRSA infections.      Time coordinating discharge: < 30 minutes  SIGNED:   Louellen Molder, MD  [facility] 06/08/2016, 10:39 AM Pager   If 7PM-7AM, please contact night-coverage [email or web address] Password [email or web address]

## 2016-06-08 NOTE — Discharge Instructions (Signed)
Gout Gout is an inflammatory arthritis caused by a buildup of uric acid crystals in the joints. Uric acid is a chemical that is normally present in the blood. When the level of uric acid in the blood is too high it can form crystals that deposit in your joints and tissues. This causes joint redness, soreness, and swelling (inflammation). Repeat attacks are common. Over time, uric acid crystals can form into masses (tophi) near a joint, destroying bone and causing disfigurement. Gout is treatable and often preventable. CAUSES  The disease begins with elevated levels of uric acid in the blood. Uric acid is produced by your body when it breaks down a naturally found substance called purines. Certain foods you eat, such as meats and fish, contain high amounts of purines. Causes of an elevated uric acid level include:  Being passed down from parent to child (heredity).  Diseases that cause increased uric acid production (such as obesity, psoriasis, and certain cancers).  Excessive alcohol use.  Diet, especially diets rich in meat and seafood.  Medicines, including certain cancer-fighting medicines (chemotherapy), water pills (diuretics), and aspirin.  Chronic kidney disease. The kidneys are no longer able to remove uric acid well.  Problems with metabolism. Conditions strongly associated with gout include:  Obesity.  High blood pressure.  High cholesterol.  Diabetes. Not everyone with elevated uric acid levels gets gout. It is not understood why some people get gout and others do not. Surgery, joint injury, and eating too much of certain foods are some of the factors that can lead to gout attacks. SYMPTOMS   An attack of gout comes on quickly. It causes intense pain with redness, swelling, and warmth in a joint.  Fever can occur.  Often, only one joint is involved. Certain joints are more commonly involved:  Base of the big toe.  Knee.  Ankle.  Wrist.  Finger. Without  treatment, an attack usually goes away in a few days to weeks. Between attacks, you usually will not have symptoms, which is different from many other forms of arthritis. DIAGNOSIS  Your caregiver will suspect gout based on your symptoms and exam. In some cases, tests may be recommended. The tests may include:  Blood tests.  Urine tests.  X-rays.  Joint fluid exam. This exam requires a needle to remove fluid from the joint (arthrocentesis). Using a microscope, gout is confirmed when uric acid crystals are seen in the joint fluid. TREATMENT  There are two phases to gout treatment: treating the sudden onset (acute) attack and preventing attacks (prophylaxis).  Treatment of an Acute Attack.  Medicines are used. These include anti-inflammatory medicines or steroid medicines.  An injection of steroid medicine into the affected joint is sometimes necessary.  The painful joint is rested. Movement can worsen the arthritis.  You may use warm or cold treatments on painful joints, depending which works best for you.  Treatment to Prevent Attacks.  If you suffer from frequent gout attacks, your caregiver may advise preventive medicine. These medicines are started after the acute attack subsides. These medicines either help your kidneys eliminate uric acid from your body or decrease your uric acid production. You may need to stay on these medicines for a very long time.  The early phase of treatment with preventive medicine can be associated with an increase in acute gout attacks. For this reason, during the first few months of treatment, your caregiver may also advise you to take medicines usually used for acute gout treatment. Be sure you  understand your caregiver's directions. Your caregiver may make several adjustments to your medicine dose before these medicines are effective.  Discuss dietary treatment with your caregiver or dietitian. Alcohol and drinks high in sugar and fructose and foods  such as meat, poultry, and seafood can increase uric acid levels. Your caregiver or dietitian can advise you on drinks and foods that should be limited. HOME CARE INSTRUCTIONS   Do not take aspirin to relieve pain. This raises uric acid levels.  Only take over-the-counter or prescription medicines for pain, discomfort, or fever as directed by your caregiver.  Rest the joint as much as possible. When in bed, keep sheets and blankets off painful areas.  Keep the affected joint raised (elevated).  Apply warm or cold treatments to painful joints. Use of warm or cold treatments depends on which works best for you.  Use crutches if the painful joint is in your leg.  Drink enough fluids to keep your urine clear or pale yellow. This helps your body get rid of uric acid. Limit alcohol, sugary drinks, and fructose drinks.  Follow your dietary instructions. Pay careful attention to the amount of protein you eat. Your daily diet should emphasize fruits, vegetables, whole grains, and fat-free or low-fat milk products. Discuss the use of coffee, vitamin C, and cherries with your caregiver or dietitian. These may be helpful in lowering uric acid levels.  Maintain a healthy body weight. SEEK MEDICAL CARE IF:   You develop diarrhea, vomiting, or any side effects from medicines.  You do not feel better in 24 hours, or you are getting worse. SEEK IMMEDIATE MEDICAL CARE IF:   Your joint becomes suddenly more tender, and you have chills or a fever. MAKE SURE YOU:   Understand these instructions.  Will watch your condition.  Will get help right away if you are not doing well or get worse.   This information is not intended to replace advice given to you by your health care provider. Make sure you discuss any questions you have with your health care provider.   Document Released: 10/28/2000 Document Revised: 11/21/2014 Document Reviewed: 06/13/2012 Elsevier Interactive Patient Education 2016  [employer].   Diabetes Mellitus and Food It is important for you to manage your blood sugar (glucose) level. Your blood glucose level can be greatly affected by what you eat. Eating healthier foods in the appropriate amounts throughout the day at about the same time each day will help you control your blood glucose level. It can also help slow or prevent worsening of your diabetes mellitus. Healthy eating may even help you improve the level of your blood pressure and reach or maintain a healthy weight.  General recommendations for healthful eating and cooking habits include:  Eating meals and snacks regularly. Avoid going long periods of time without eating to lose weight.  Eating a diet that consists mainly of plant-based foods, such as fruits, vegetables, nuts, legumes, and whole grains.  Using low-heat cooking methods, such as baking, instead of high-heat cooking methods, such as deep frying. Work with your dietitian to make sure you understand how to use the Nutrition Facts information on food labels. HOW CAN FOOD AFFECT ME? Carbohydrates Carbohydrates affect your blood glucose level more than any other type of food. Your dietitian will help you determine how many carbohydrates to eat at each meal and teach you how to count carbohydrates. Counting carbohydrates is important to keep your blood glucose at a healthy level, especially if you are using insulin  or taking certain medicines for diabetes mellitus. Alcohol Alcohol can cause sudden decreases in blood glucose (hypoglycemia), especially if you use insulin or take certain medicines for diabetes mellitus. Hypoglycemia can be a life-threatening condition. Symptoms of hypoglycemia (sleepiness, dizziness, and disorientation) are similar to symptoms of having too much alcohol.  If your health care provider has given you approval to drink alcohol, do so in moderation and use the following guidelines:  Women should not have more than one drink  per day, and men should not have more than two drinks per day. One drink is equal to:  12 oz of beer.  5 oz of wine.  1 oz of hard liquor.  Do not drink on an empty stomach.  Keep yourself hydrated. Have water, diet soda, or unsweetened iced tea.  Regular soda, juice, and other mixers might contain a lot of carbohydrates and should be counted. WHAT FOODS ARE NOT RECOMMENDED? As you make food choices, it is important to remember that all foods are not the same. Some foods have fewer nutrients per serving than other foods, even though they might have the same number of calories or carbohydrates. It is difficult to get your body what it needs when you eat foods with fewer nutrients. Examples of foods that you should avoid that are high in calories and carbohydrates but low in nutrients include:  Trans fats (most processed foods list trans fats on the Nutrition Facts label).  Regular soda.  Juice.  Candy.  Sweets, such as cake, pie, doughnuts, and cookies.  Fried foods. WHAT FOODS CAN I EAT? Eat nutrient-rich foods, which will nourish your body and keep you healthy. The food you should eat also will depend on several factors, including:  The calories you need.  The medicines you take.  Your weight.  Your blood glucose level.  Your blood pressure level.  Your cholesterol level. You should eat a variety of foods, including:  Protein.  Lean cuts of meat.  Proteins low in saturated fats, such as fish, egg whites, and beans. Avoid processed meats.  Fruits and vegetables.  Fruits and vegetables that may help control blood glucose levels, such as apples, mangoes, and yams.  Dairy products.  Choose fat-free or low-fat dairy products, such as milk, yogurt, and cheese.  Grains, bread, pasta, and rice.  Choose whole grain products, such as multigrain bread, whole oats, and brown rice. These foods may help control blood pressure.  Fats.  Foods containing healthful  fats, such as nuts, avocado, olive oil, canola oil, and fish. DOES EVERYONE WITH DIABETES MELLITUS HAVE THE SAME MEAL PLAN? Because every person with diabetes mellitus is different, there is not one meal plan that works for everyone. It is very important that you meet with a dietitian who will help you create a meal plan that is just right for you.   This information is not intended to replace advice given to you by your health care provider. Make sure you discuss any questions you have with your health care provider.   Document Released: 07/28/2005 Document Revised: 11/21/2014 Document Reviewed: 09/27/2013 Elsevier Interactive Patient Education [employer].

## 2016-06-10 ENCOUNTER — Telehealth: Payer: Self-pay

## 2016-06-10 LAB — CULTURE, BLOOD (ROUTINE X 2)
Culture: NO GROWTH
Culture: NO GROWTH

## 2016-06-10 LAB — BODY FLUID CULTURE: Culture: NO GROWTH

## 2016-06-10 NOTE — Telephone Encounter (Signed)
1st attempt to reach pt for TCM outreach. Per pt's father, he is at work until 22PM today.

## 2016-06-10 NOTE — Telephone Encounter (Signed)
2nd attempt to reach pt for TCM outreach. Per automated msg, this is no longer a valid phone number.

## 2016-06-15 NOTE — Telephone Encounter (Signed)
noted 

## 2016-06-15 NOTE — Telephone Encounter (Signed)
3rd attempt to contact pt for TCM outreach.  Per pt, he did not wish to schedule a post-discharge f/u at this time.   Pt states insurance has denied hospital claims and he is facing a 20K bill for services provided.   Reviewed discharge summary with patient. The following issues were addressed:  A. Pt has not completed antibiotic therapy. Pt stated he was feeling fine and his WBC was not elevated. Pt stated he was not aware that he had been diagnosed with lobar pneumonia. Educated pt on the importance of taking antibiotics as prescribed.   B. Discussed elevated triglycerides and the non-use of statin. Pt states he is awaiting an insurance card for prescriptions.   C. Discussed elevated A1C. Pt stated he was aware that the last result was 7.   Routing note to PCP, closing encounter and discontinuing any further TCM outreach.

## 2016-11-08 IMAGING — MR MR ANKLE*L* WO/W CM
8 of 10 series · 28 of 40 positions shown · IV contrast (multihance)
Comparison: None.

CLINICAL DATA: Left ankle pain and swelling.  Fevers, chills.

EXAM:
MRI OF THE LEFT ANKLE WITHOUT AND WITH CONTRAST
TECHNIQUE: Multiplanar, multisequence MR imaging of the ankle was performed
before and after the administration of intravenous contrast.
CONTRAST:  20 mL MultiHance

[Series 4: PD fat-sat · axial · 4.0mm · 0.74mm/px · z∈[-117,+33]mm · 3 of 33 slices shown]
[im 1/33]
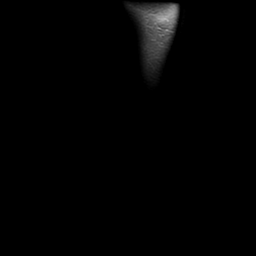
[im 17/33]
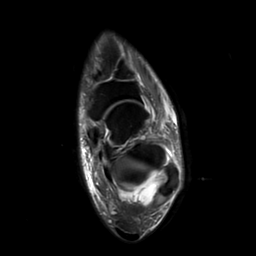
[im 33/33]
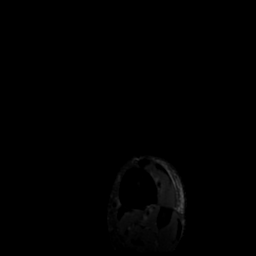

[Series 5: T1 · axial · non-contrast · 4.0mm · 0.37mm/px · z∈[-117,+33]mm · 3 of 33 slices shown (1 of 2)]
[im 1/33]
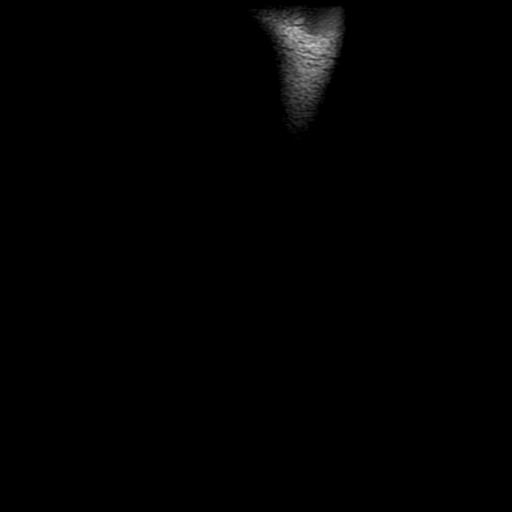
[im 17/33]
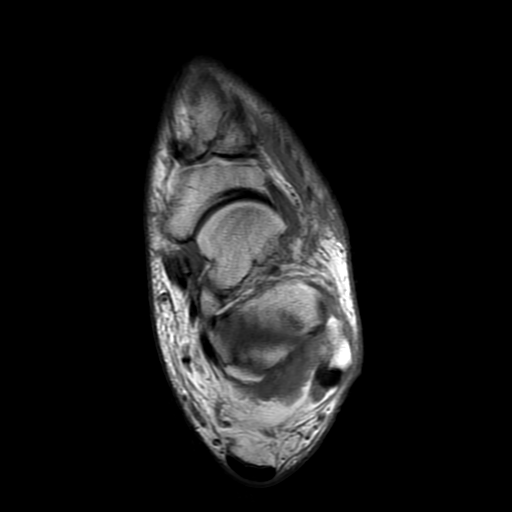
[im 33/33]
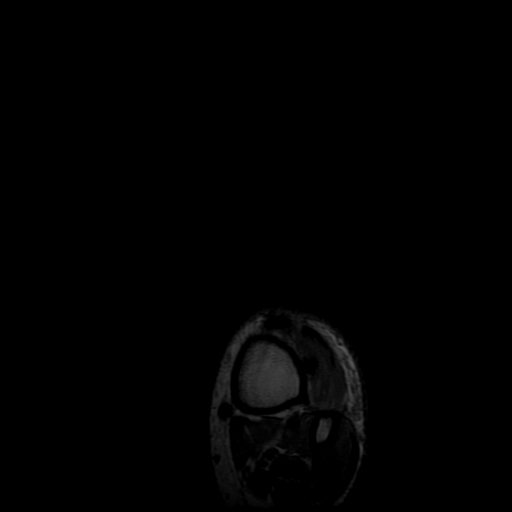

[Series 6: T1 fat-sat · axial · non-contrast · 4.0mm · 0.37mm/px · z∈[-117,+33]mm · 4 of 33 slices shown]
[im 1/33]
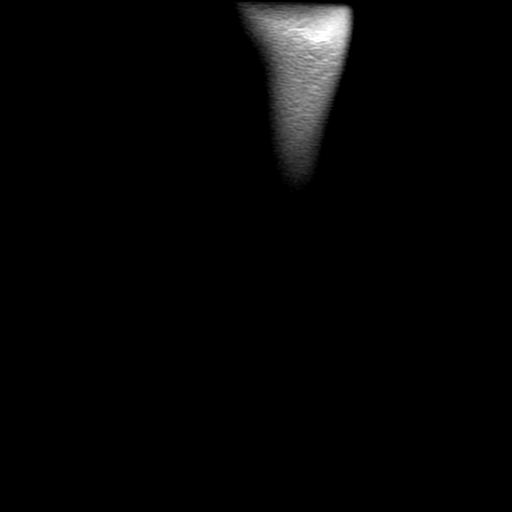
[im 11/33]
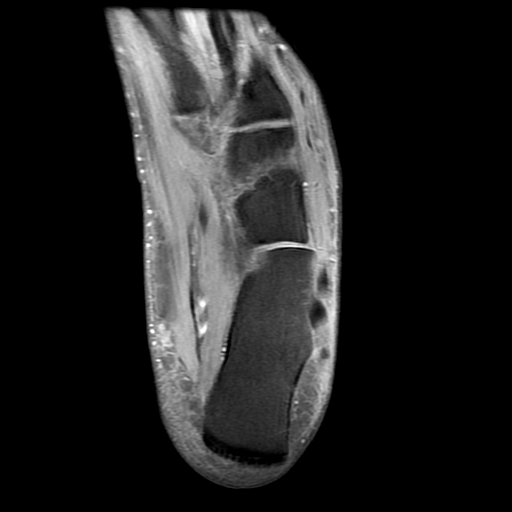
[im 22/33]
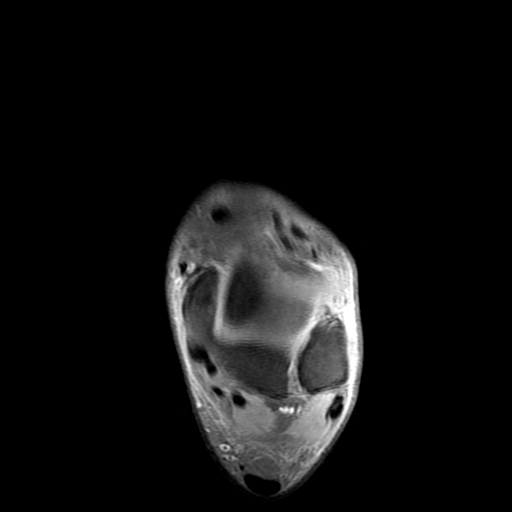
[im 33/33]
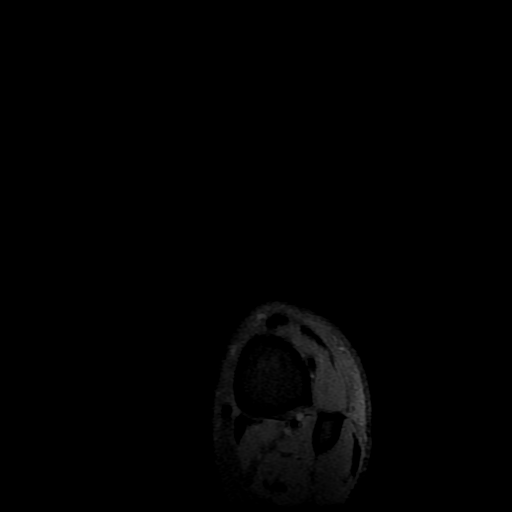

[Series 8: T2 fat-sat · axial · 4.0mm · 0.74mm/px · z∈[-117,+33]mm · 4 of 33 slices shown (1 of 2)]
[im 1/33]
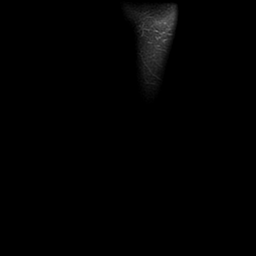
[im 11/33]
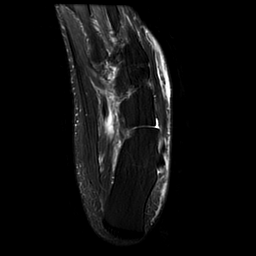
[im 22/33]
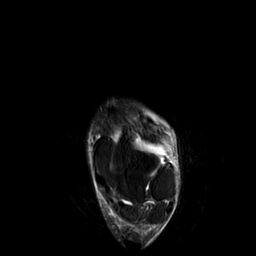
[im 33/33]
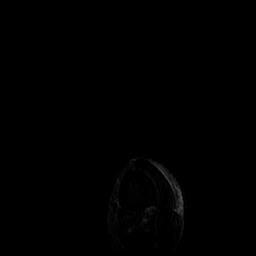

[Series 9: T2 fat-sat · coronal · 4.0mm · 0.74mm/px · 5 of 44 slices shown (2 of 2)]
[im 1/44]
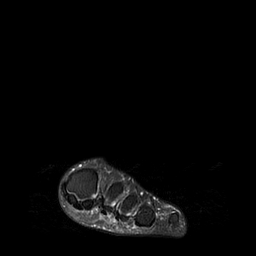
[im 11/44]
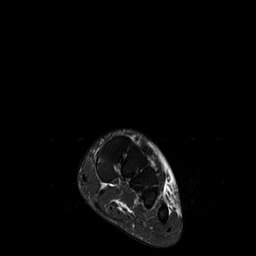
[im 22/44]
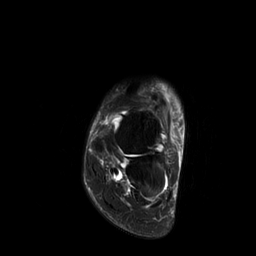
[im 33/44]
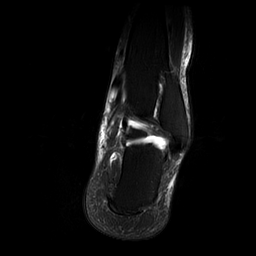
[im 44/44]
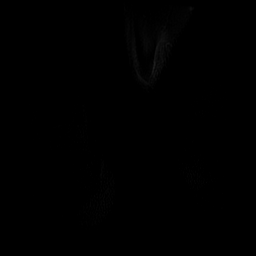

[Series 11: T1 · sagittal · 3.0mm · 0.39mm/px · 4 of 33 slices shown (2 of 2)]
[im 1/33]
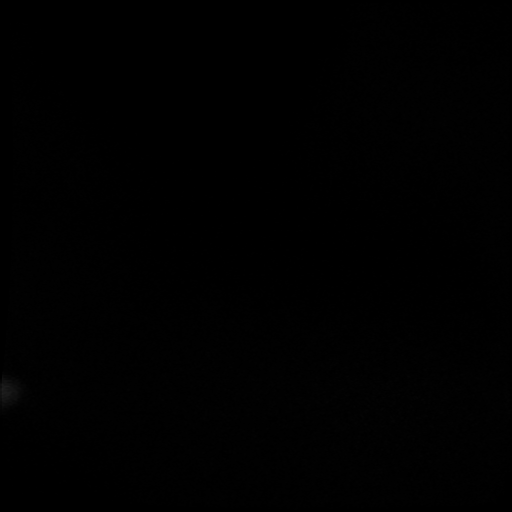
[im 11/33]
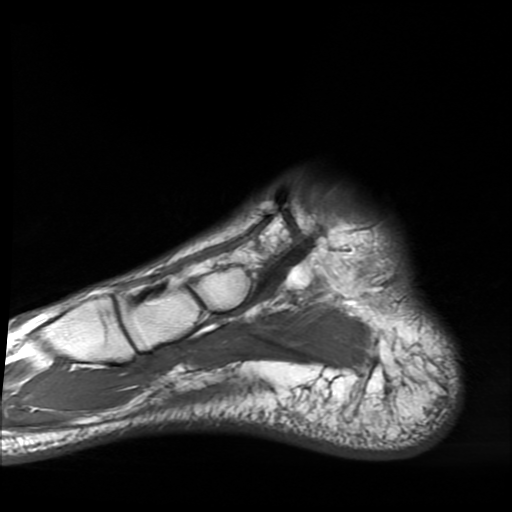
[im 22/33]
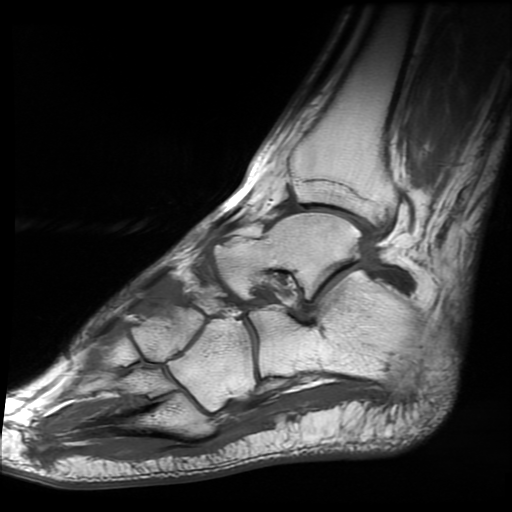
[im 33/33]
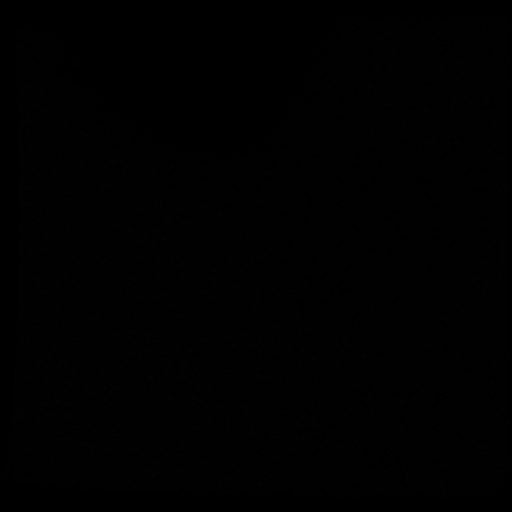

[Series 12: T1 fat-sat post-contrast · axial · 4.0mm · 0.37mm/px · z∈[-117,+33]mm · 4 of 33 slices shown (1 of 2)]
[im 1/33]
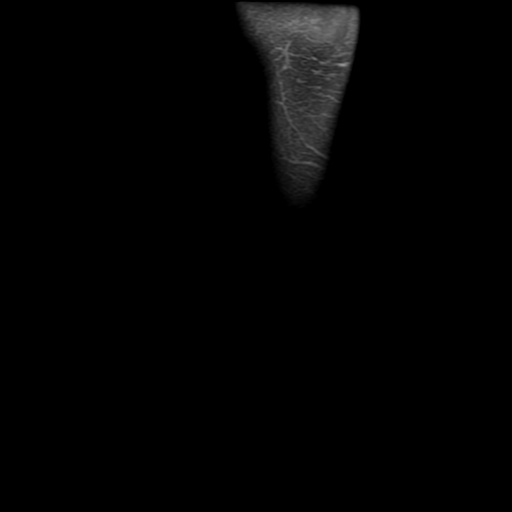
[im 11/33]
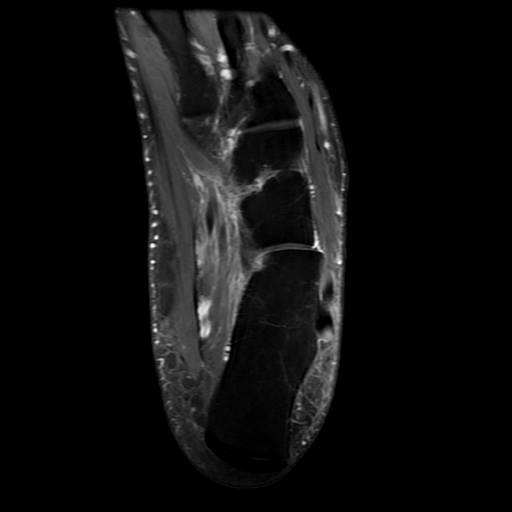
[im 22/33]
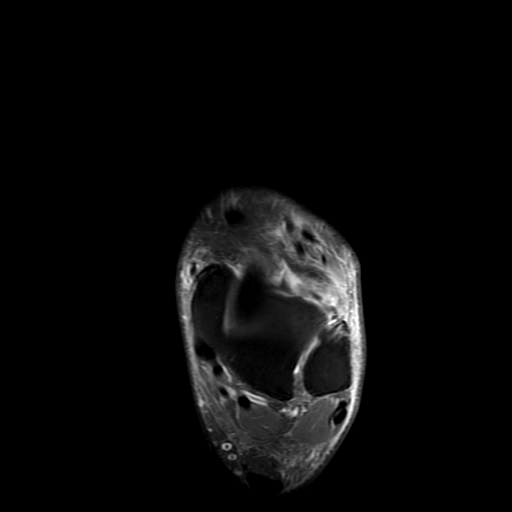
[im 33/33]
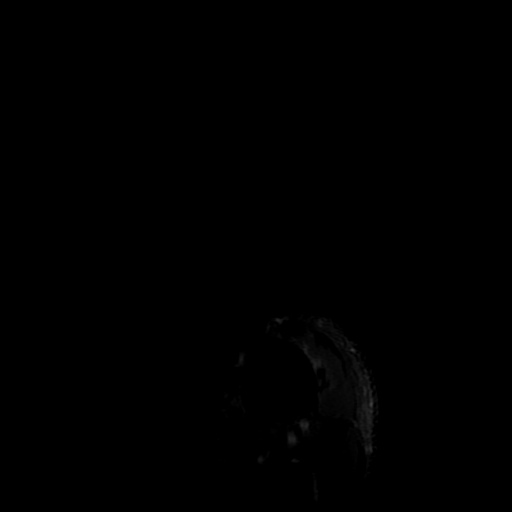

[Series 13: T1 fat-sat post-contrast · coronal · 4.0mm · 0.74mm/px · 1 of 44 slices shown (2 of 2)]
[im 1/44]
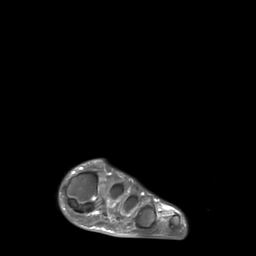

[28 of 40 positions shown; findings below may reference images not displayed]

FINDINGS: TENDONS

Peroneal: Peroneal longus tendon intact. Peroneal brevis intact.

Posteromedial: Posterior tibial tendon intact. Flexor hallucis
longus tendon intact. Flexor digitorum longus tendon intact.

Anterior: Tibialis anterior tendon intact. Extensor hallucis longus
tendon intact Extensor digitorum longus tendon intact.

Achilles:  Intact.

Plantar Fascia: Intact.

LIGAMENTS

Lateral: Anterior talofibular ligament intact. Calcaneofibular
ligament intact. Posterior talofibular ligament intact. Anterior and
posterior tibiofibular ligaments intact.

Medial: Deltoid ligament intact. Spring ligament intact.

CARTILAGE

Ankle Joint: Moderate ankle joint effusion with synovitis. Normal
ankle mortise. No chondral defect.

Subtalar Joints/Sinus Tarsi: Normal subtalar joints. Subtalar joint
effusion with synovitis.

Bones: Mild osteoarthritis of the talonavicular joint. Small
talonavicular joint effusion and mild synovitis.

Soft Tissue: Mild soft tissue swelling around the lateral aspect of
the ankle and foot which may be reactive versus secondary to mild
cellulitis.
IMPRESSION: IMPRESSION
1. Moderate ankle joint effusion with synovitis. Small subtalar
joint effusion with synovitis. Small talonavicular joint effusion
with synovitis. Differential diagnosis includes an inflammatory
arthropathy such as rheumatoid arthritis versus septic arthritis.

## 2016-11-11 ENCOUNTER — Telehealth: Payer: Self-pay

## 2016-11-11 NOTE — Telephone Encounter (Signed)
Pt left /vm requesting cb about refill; pt did not leave name of med. Pt last seen 03/29/16. Left v/m requesting pt to cb.

## 2016-11-15 ENCOUNTER — Ambulatory Visit (INDEPENDENT_AMBULATORY_CARE_PROVIDER_SITE_OTHER): Payer: 59 | Admitting: Internal Medicine

## 2016-11-15 ENCOUNTER — Encounter: Payer: Self-pay | Admitting: Internal Medicine

## 2016-11-15 VITALS — BP 114/80 | HR 106 | Temp 98.4°F | Wt 232.0 lb

## 2016-11-15 DIAGNOSIS — B9689 Other specified bacterial agents as the cause of diseases classified elsewhere: Secondary | ICD-10-CM

## 2016-11-15 DIAGNOSIS — J019 Acute sinusitis, unspecified: Secondary | ICD-10-CM

## 2016-11-15 MED ORDER — AMOXICILLIN-POT CLAVULANATE 875-125 MG PO TABS: 1.0000 | ORAL_TABLET | Freq: Two times a day (BID) | ORAL | 0 refills | Status: DC

## 2016-11-15 NOTE — Progress Notes (Signed)
HPI  Pt presents to the clinic today with c/o nasal congestion, ear fullness and cough. This started 1 week ago. He is blowing green mucous out of his nose. He denies decreased hearing or ear pain. The cough is productive of green mucous. He denies fever, chills or body aches. He has tried Ibuprofen, saline nasal spray and leftover abx with some relief. He has no history of allergies or breathing problems. He has not had sick contacts that he is aware of.   Review of Systems     Past Medical History:  Diagnosis Date  . Anxiety   . Chicken pox   . Skin cancer of face     Family History  Problem Relation Age of Onset  . Heart disease Mother   . Diabetes Mother   . Parkinson's disease Mother   . Heart disease Father   . Diabetes Father   . Cancer Neg Hx   . Stroke Neg Hx     Social History   Social History  . Marital status: Married    Spouse name: N/A  . Number of children: N/A  . Years of education: N/A   Occupational History  . Not on file.   Social History Main Topics  . Smoking status: Never Smoker  . Smokeless tobacco: Current User    Types: Chew  . Alcohol use 0.0 oz/week     Comment: rare  . Drug use: No  . Sexual activity: Yes   Other Topics Concern  . Not on file   Social History Narrative  . No narrative on file    Allergies  Allergen Reactions  . Bee Venom Anaphylaxis     Constitutional: Positive headache. Denies fatigue, fever or abrupt weight changes.  HEENT:  Positive nasal congestion, ear fullness and sore throat. Denies eye redness, ear pain, ringing in the ears, wax buildup, runny nose or bloody nose. Respiratory: Positive cough. Denies difficulty breathing or shortness of breath.  Cardiovascular: Denies chest pain, chest tightness, palpitations or swelling in the hands or feet.   No other specific complaints in a complete review of systems (except as listed in HPI above).  Objective:   BP 114/80   Pulse (!) 106   Temp 98.4 F (36.9  C) (Oral)   Wt 232 lb (105.2 kg)   SpO2 97%   BMI 31.46 kg/m   General: Appears his stated age, in NAD. HEENT: Head: normal shape and size, frontal sinus tenderness noted; Eyes: sclera white, no icterus, conjunctiva pink; Ears: Tm's pink but intact, normal light reflex; Nose: mucosa pink and moist, septum midline; Throat/Mouth: + PND. Teeth present, mucosa erythematous and moist, no exudate noted, no lesions or ulcerations noted.  Neck:  No adenopathy noted.  Cardiovascular: Tachycardic with normal rhythm. S1,S2 noted.  No murmur, rubs or gallops noted.  Pulmonary/Chest: Normal effort and positive vesicular breath sounds. No respiratory distress. No wheezes, rales or ronchi noted.       Assessment & Plan:   Acute bacterial sinusitis  Can use a Neti Pot which can be purchased from your local drug store. Nasocort- use as directed eRx for Augmentin BID for 10 days  RTC as needed or if symptoms persist. Webb Silversmith, NP

## 2016-11-15 NOTE — Patient Instructions (Signed)

## 2016-12-06 NOTE — Telephone Encounter (Signed)
I spoke with pt and he does not need anything now. Pt was seen since call made.

## 2018-07-17 ENCOUNTER — Telehealth: Payer: Self-pay

## 2018-07-17 NOTE — Telephone Encounter (Signed)
Copied from [place]. Topic: Appointment Scheduling - Scheduling Inquiry for Clinic >> Jul 17, 2018  9:25 AM Joshua Bean wrote: Reason for CRM: Scheduled pt for tomorrow but pt would like to know if he can be worked in any sooner. Please advise.  [phone]

## 2018-07-17 NOTE — Telephone Encounter (Signed)
I spoke with pt; had rash in groin for awhile; pt is presently sitting in exam room at [facility] and does not want to cancel appt with Glenda Chroman FNP until after sees UC provider. Pt will cb and cancel appt if feels comfortable with what is told to pt at [facility]. FYI to Glenda Chroman FNP.

## 2018-07-18 ENCOUNTER — Ambulatory Visit: Payer: Self-pay | Admitting: Family Medicine

## 2018-07-18 DIAGNOSIS — Z0289 Encounter for other administrative examinations: Secondary | ICD-10-CM

## 2018-07-18 NOTE — Telephone Encounter (Signed)
Noted  

## 2018-07-20 ENCOUNTER — Ambulatory Visit: Payer: Self-pay | Admitting: Family Medicine

## 2018-07-20 DIAGNOSIS — Z0289 Encounter for other administrative examinations: Secondary | ICD-10-CM

## 2020-06-04 ENCOUNTER — Other Ambulatory Visit: Payer: Self-pay

## 2020-06-04 ENCOUNTER — Ambulatory Visit (INDEPENDENT_AMBULATORY_CARE_PROVIDER_SITE_OTHER)
Admission: RE | Admit: 2020-06-04 | Discharge: 2020-06-04 | Disposition: A | Discharge status: Home / Self Care | Payer: 59 | Source: Ambulatory Visit | Attending: Internal Medicine | Admitting: Internal Medicine

## 2020-06-04 ENCOUNTER — Ambulatory Visit (INDEPENDENT_AMBULATORY_CARE_PROVIDER_SITE_OTHER): Payer: 59 | Admitting: Internal Medicine

## 2020-06-04 ENCOUNTER — Encounter: Payer: Self-pay | Admitting: Internal Medicine

## 2020-06-04 VITALS — BP 122/82 | HR 108 | Temp 97.4°F | Wt 210.0 lb

## 2020-06-04 DIAGNOSIS — N529 Male erectile dysfunction, unspecified: Secondary | ICD-10-CM | POA: Insufficient documentation

## 2020-06-04 DIAGNOSIS — F419 Anxiety disorder, unspecified: Secondary | ICD-10-CM

## 2020-06-04 DIAGNOSIS — E119 Type 2 diabetes mellitus without complications: Secondary | ICD-10-CM

## 2020-06-04 DIAGNOSIS — R202 Paresthesia of skin: Secondary | ICD-10-CM | POA: Diagnosis not present

## 2020-06-04 DIAGNOSIS — M79602 Pain in left arm: Secondary | ICD-10-CM

## 2020-06-04 DIAGNOSIS — M10079 Idiopathic gout, unspecified ankle and foot: Secondary | ICD-10-CM | POA: Diagnosis not present

## 2020-06-04 DIAGNOSIS — M79601 Pain in right arm: Secondary | ICD-10-CM

## 2020-06-04 DIAGNOSIS — E782 Mixed hyperlipidemia: Secondary | ICD-10-CM

## 2020-06-04 DIAGNOSIS — F411 Generalized anxiety disorder: Secondary | ICD-10-CM | POA: Diagnosis not present

## 2020-06-04 DIAGNOSIS — N528 Other male erectile dysfunction: Secondary | ICD-10-CM

## 2020-06-04 DIAGNOSIS — F329 Major depressive disorder, single episode, unspecified: Secondary | ICD-10-CM

## 2020-06-04 DIAGNOSIS — F32A Depression, unspecified: Secondary | ICD-10-CM

## 2020-06-04 MED ORDER — ALPRAZOLAM 0.5 MG PO TABS: 0.5000 mg | ORAL_TABLET | Freq: Every evening | ORAL | 0 refills | Status: DC | PRN

## 2020-06-04 MED ORDER — SILDENAFIL CITRATE 25 MG PO TABS: 25.0000 mg | ORAL_TABLET | Freq: Every day | ORAL | 0 refills | Status: DC | PRN

## 2020-06-04 MED ORDER — CITALOPRAM HYDROBROMIDE 10 MG PO TABS: 10.0000 mg | ORAL_TABLET | Freq: Every day | ORAL | 2 refills | Status: DC

## 2020-06-04 MED ORDER — MELOXICAM 7.5 MG PO TABS: 7.5000 mg | ORAL_TABLET | Freq: Every day | ORAL | 2 refills | Status: DC

## 2020-06-04 NOTE — Assessment & Plan Note (Signed)
Uric acid level today Meloxicam refilled per patient request for flares

## 2020-06-04 NOTE — Assessment & Plan Note (Signed)
CBC, C met, lipid, A1c and urine microalbumin today Encouraged him to consume a low carb diet and exercise for weight loss Encouraged routine eye exam Encouraged routine foot exam Encouraged him to get a flu shot the fall We will discuss Pneumovax at annual exam He declines Covid at this time

## 2020-06-04 NOTE — Assessment & Plan Note (Signed)
C met and lipid profile today Encouraged him to consume a low-fat diet 

## 2020-06-04 NOTE — Assessment & Plan Note (Signed)
We will try Viagra, Rx sent to pharmacy

## 2020-06-04 NOTE — Patient Instructions (Signed)

## 2020-06-04 NOTE — Assessment & Plan Note (Signed)
Deteriorated We will start Citalopram 10 mg p.o. daily Rx for Xanax 0.5 mg daily as needed, sedation and addiction caution given Support offered today Referral to psychology placed for CBT

## 2020-06-04 NOTE — Progress Notes (Signed)
HPI  Patient presents the clinic today to reestablish care and for management of the conditions listed below.  He has not had a PCP in the last few years.  Anxiety and Depression: Intermittent. He feels like he is having trouble keeping his cool lately. He is having difficulty sleeping. He reports he can not fall asleep or stay asleep. He reports crying spells for no reason. He reports lack of motivation, difficulty concentrating. He is not sure what triggers this, he thinks it may related to low testosterone. He reports stress at work, home life is good except for kids go back to mom house. He reports he has been on Citalopram, Buspar and Xanax in the past. He has seen Dr. Rexene Edison in the past. He denies SI/HI.  HLD: His last LDL was not calculated due to triglycerides of 636, 03/2016. He is prescribed Simvastatin but is not taking it. He does not consume a low-fat diet  DM2: His last A1c was 7%, 05/2016.  He does not check his sugars.  He does not check his feet routinely. He has lost at least 50 lbs.  Gout: Intermittent b/l ankles. He takes Meloxicam as needed with good relief of symptoms.   Pt reports joint pain in hands and loss of dexterity. He noticed this about 2 years ago. He denies joint swelling or weakness but intermittent numbness and tingling as well. He reports history of spinal injury  8 years ago. He has taken Ibuprofen OTC as needed with minimal relief of symptoms. He denies any family history of autoimmune disorders.   He also reports erectile dysfunction.  He has difficulty initiating and maintaining erection.  He is able to ejaculate.  He has tried sildenafil in the past with good relief of symptoms.   Flu: 07/2014 Tetanus: unsure Pneumovax: unsure Covid: never Vision screening: as needed Dentist: annually  Past Medical History:  Diagnosis Date  . Anxiety   . Chicken pox   . Skin cancer of face     Current Outpatient Medications  Medication Sig Dispense Refill  .  ALPRAZolam (XANAX) 0.5 MG tablet Take 1 tablet (0.5 mg total) by mouth daily as needed for anxiety. 30 tablet 0  . amoxicillin-clavulanate (AUGMENTIN) 875-125 MG tablet Take 1 tablet by mouth 2 (two) times daily. 14 tablet 0  . cyclobenzaprine (FLEXERIL) 10 MG tablet Take 5 mg by mouth as needed for muscle spasms.    . simvastatin (ZOCOR) 20 MG tablet Take 1 tablet (20 mg total) by mouth at bedtime. 30 tablet 2  . triamcinolone (NASACORT) 55 MCG/ACT AERO nasal inhaler Place 2 sprays into the nose daily as needed (allergies).      No current facility-administered medications for this visit.    Allergies  Allergen Reactions  . Bee Venom Anaphylaxis    Family History  Problem Relation Age of Onset  . Heart disease Mother   . Diabetes Mother   . Parkinson's disease Mother   . Heart disease Father   . Diabetes Father   . Cancer Neg Hx   . Stroke Neg Hx     Social History   Socioeconomic History  . Marital status: Married    Spouse name: Not on file  . Number of children: Not on file  . Years of education: Not on file  . Highest education level: Not on file  Occupational History  . Not on file  Tobacco Use  . Smoking status: Never Smoker  . Smokeless tobacco: Current User  Types: Chew  Substance and Sexual Activity  . Alcohol use: Yes    Alcohol/week: 0.0 standard drinks    Comment: rare  . Drug use: No  . Sexual activity: Yes  Other Topics Concern  . Not on file  Social History Narrative  . Not on file   Social Determinants of Health   Financial Resource Strain:   . Difficulty of Paying Living Expenses:   Food Insecurity:   . Worried About Charity fundraiser in the Last Year:   . Arboriculturist in the Last Year:   Transportation Needs:   . Film/video editor (Medical):   Marland Kitchen Lack of Transportation (Non-Medical):   Physical Activity:   . Days of Exercise per Week:   . Minutes of Exercise per Session:   Stress:   . Feeling of Stress :   Social  Connections:   . Frequency of Communication with Friends and Family:   . Frequency of Social Gatherings with Friends and Family:   . Attends Religious Services:   . Active Member of Clubs or Organizations:   . Attends Archivist Meetings:   Marland Kitchen Marital Status:   Intimate Partner Violence:   . Fear of Current or Ex-Partner:   . Emotionally Abused:   Marland Kitchen Physically Abused:   . Sexually Abused:     ROS:  Constitutional: Denies fever, malaise, fatigue, headache or abrupt weight changes.  HEENT: Denies eye pain, eye redness, ear pain, ringing in the ears, wax buildup, runny nose, nasal congestion, bloody nose, or sore throat. Respiratory: Denies difficulty breathing, shortness of breath, cough or sputum production.   Cardiovascular: Denies chest pain, chest tightness, palpitations or swelling in the hands or feet.  Gastrointestinal: Denies abdominal pain, bloating, constipation, diarrhea or blood in the stool.  GU: Patient reports erectile dysfunction.  Denies frequency, urgency, pain with urination, blood in urine, odor or discharge. Musculoskeletal: Patient reports joint pain and loss of dexterity in hands.  Denies decrease in range of motion, difficulty with gait, muscle pain or swelling.  Skin: Denies redness, rashes, lesions or ulcercations.  Neurological: Patient reports intermittent numbness and tingling of his upper extremities.  Denies dizziness, difficulty with memory, difficulty with speech or problems with balance and coordination.  Psych: Patient has a history of anxiety and depression.  Denies SI/HI.  No other specific complaints in a complete review of systems (except as listed in HPI above).  PE:  BP 122/82   Pulse (!) 108   Temp (!) 97.4 F (36.3 C) (Temporal)   Wt (!) 210 lb (95.3 kg)   SpO2 97%   BMI 28.48 kg/m   Wt Readings from Last 3 Encounters:  11/15/16 232 lb (105.2 kg)  06/06/16 230 lb 1.6 oz (104.4 kg)  03/29/16 222 lb (100.7 kg)    General:  Appears his stated age, obese, in NAD. Skin: Dry and intact. No ulcerations noted. HEENT: Head: normal shape and size; Eyes: sclera white, no icterus, and EOMs intact; Cardiovascular: Normal rate and rhythm. S1,S2 noted.  No murmur, rubs or gallops noted. No JVD or BLE edema. No carotid bruits noted. Pulmonary/Chest: Normal effort and positive vesicular breath sounds. No respiratory distress. No wheezes, rales or ronchi noted.  Musculoskeletal: Normal flexion, extension and rotation of the wrist but with crepitus noted.  Normal flexion and extension of the fingers.  No joint swelling noted. Strength 5/5 BLE.No difficulty with gait.  Neurological: Alert and oriented.  Negative Phalen's, negative Tinel's.  Coordination  normal.  Psychiatric: Mildly anxious appearing. Behavior is normal. Judgment and thought content normal.    BMET    Component Value Date/Time   NA 141 06/06/2016 0456   NA 137 03/29/2016 1349   K 3.8 06/06/2016 0456   CL 111 06/06/2016 0456   CO2 24 06/06/2016 0456   GLUCOSE 133 (H) 06/06/2016 0456   BUN 8 06/06/2016 0456   BUN 11 03/29/2016 1349   CREATININE 0.74 06/06/2016 0456   CALCIUM 8.0 (L) 06/06/2016 0456   GFRNONAA >60 06/06/2016 0456   GFRAA >60 06/06/2016 0456    Lipid Panel     Component Value Date/Time   CHOL 209 (H) 03/29/2016 1349   TRIG 636 (HH) 03/29/2016 1349   HDL 30 (L) 03/29/2016 1349   CHOLHDL 7.0 (H) 03/29/2016 1349   LDLCALC Comment 03/29/2016 1349    CBC    Component Value Date/Time   WBC 7.5 06/06/2016 0456   RBC 3.98 (L) 06/06/2016 0456   HGB 12.3 (L) 06/06/2016 0456   HCT 36.8 (L) 06/06/2016 0456   PLT 188 06/06/2016 0456   MCV 92.5 06/06/2016 0456   MCH 30.9 06/06/2016 0456   MCHC 33.4 06/06/2016 0456   RDW 13.0 06/06/2016 0456   RDW 13.6 10/21/2014 1426   LYMPHSABS 2.1 06/05/2016 2210   LYMPHSABS 2.6 10/21/2014 1426   MONOABS 1.2 (H) 06/05/2016 2210   EOSABS 0.1 06/05/2016 2210   EOSABS 0.2 10/21/2014 1426   BASOSABS 0.0  06/05/2016 2210   BASOSABS 0.1 10/21/2014 1426    Hgb A1C Lab Results  Component Value Date   HGBA1C 7.0 (H) 06/06/2016     Assessment and Plan:  Paresthesia of BUE:  History of spinal injury, will obtain x-ray of cervical spine May need MRI cervical spine pending x-ray results We will check TSH, vitamin D and B12  Make an appointment for annual exam Webb Silversmith, NP This visit occurred during the SARS-CoV-2 public health emergency.  Safety protocols were in place, including screening questions prior to the visit, additional usage of staff PPE, and extensive cleaning of exam room while observing appropriate contact time as indicated for disinfecting solutions.

## 2020-06-05 LAB — CBC: RDW: 12.1 % (ref 11.6–15.4)

## 2020-06-05 LAB — VITAMIN D 25 HYDROXY (VIT D DEFICIENCY, FRACTURES)

## 2020-06-05 LAB — LIPID PANEL

## 2020-06-05 LAB — COMPREHENSIVE METABOLIC PANEL: Chloride: 94 mmol/L — ABNORMAL LOW (ref 96–106)

## 2020-06-05 LAB — HEMOGLOBIN A1C: Est. average glucose Bld gHb Est-mCnc: 272 mg/dL

## 2020-06-10 LAB — URIC ACID: Uric Acid: 6 mg/dL (ref 3.8–8.4)

## 2020-06-10 LAB — TSH: TSH: 1.16 u[IU]/mL (ref 0.450–4.500)

## 2020-06-10 LAB — COMPREHENSIVE METABOLIC PANEL
Albumin/Globulin Ratio: 2 (ref 1.2–2.2)
Albumin: 4.5 g/dL (ref 4.0–5.0)
Alkaline Phosphatase: 99 IU/L (ref 48–121)
BUN/Creatinine Ratio: 19 (ref 9–20)
BUN: 12 mg/dL (ref 6–24)
Bilirubin Total: 0.5 mg/dL (ref 0.0–1.2)
CO2: 19 mmol/L — ABNORMAL LOW (ref 20–29)
Calcium: 9 mg/dL (ref 8.7–10.2)
Creatinine, Ser: 0.64 mg/dL — ABNORMAL LOW (ref 0.76–1.27)
GFR calc Af Amer: 139 mL/min/{1.73_m2} (ref >59)
GFR calc non Af Amer: 120 mL/min/{1.73_m2} (ref >59)
Globulin, Total: 2.3 g/dL (ref 1.5–4.5)
Glucose: 295 mg/dL — ABNORMAL HIGH (ref 65–99)
Potassium: 4.1 mmol/L (ref 3.5–5.2)
Sodium: 129 mmol/L — ABNORMAL LOW (ref 134–144)
Total Protein: 6.8 g/dL (ref 6.0–8.5)

## 2020-06-10 LAB — LIPID PANEL
Chol/HDL Ratio: 34.7 ratio — ABNORMAL HIGH (ref 0.0–5.0)
Cholesterol, Total: 521 mg/dL — ABNORMAL HIGH (ref 100–199)
HDL: 15 mg/dL — ABNORMAL LOW (ref >39)

## 2020-06-10 LAB — MICROALBUMIN / CREATININE URINE RATIO
Creatinine, Urine: 63.4 mg/dL
Microalb/Creat Ratio: 19 mg/g creat (ref 0–29)
Microalbumin, Urine: 12.1 ug/mL

## 2020-06-10 LAB — VITAMIN B12: Vitamin B-12: 333 pg/mL (ref 232–1245)

## 2020-06-10 LAB — CBC
Hematocrit: 48.6 % (ref 37.5–51.0)
Hemoglobin: 17 g/dL (ref 13.0–17.7)
MCH: 32.9 pg (ref 26.6–33.0)
MCHC: 35 g/dL (ref 31.5–35.7)
MCV: 94 fL (ref 79–97)
Platelets: 264 10*3/uL (ref 150–450)
RBC: 5.16 x10E6/uL (ref 4.14–5.80)
WBC: 5.7 10*3/uL (ref 3.4–10.8)

## 2020-06-10 LAB — HEMOGLOBIN A1C: Hgb A1c MFr Bld: 11.1 % — ABNORMAL HIGH (ref 4.8–5.6)

## 2020-07-14 ENCOUNTER — Ambulatory Visit (INDEPENDENT_AMBULATORY_CARE_PROVIDER_SITE_OTHER): Payer: 59 | Admitting: Psychology

## 2020-07-14 DIAGNOSIS — F411 Generalized anxiety disorder: Secondary | ICD-10-CM | POA: Diagnosis not present

## 2020-08-05 ENCOUNTER — Ambulatory Visit: Payer: 59 | Admitting: Psychology

## 2020-08-14 ENCOUNTER — Other Ambulatory Visit: Payer: Self-pay

## 2020-08-14 ENCOUNTER — Inpatient Hospital Stay (HOSPITAL_COMMUNITY)
Admission: EM | Admit: 2020-08-14 | Discharge: 2020-08-30 | DRG: 177 | Disposition: A | Discharge status: Home / Self Care | Payer: 59 | Attending: Internal Medicine | Admitting: Internal Medicine

## 2020-08-14 ENCOUNTER — Encounter (HOSPITAL_COMMUNITY): Payer: Self-pay

## 2020-08-14 ENCOUNTER — Emergency Department (HOSPITAL_COMMUNITY): Payer: 59

## 2020-08-14 DIAGNOSIS — F419 Anxiety disorder, unspecified: Secondary | ICD-10-CM | POA: Diagnosis present

## 2020-08-14 DIAGNOSIS — E860 Dehydration: Secondary | ICD-10-CM | POA: Diagnosis present

## 2020-08-14 DIAGNOSIS — F32A Depression, unspecified: Secondary | ICD-10-CM | POA: Diagnosis present

## 2020-08-14 DIAGNOSIS — E785 Hyperlipidemia, unspecified: Secondary | ICD-10-CM | POA: Diagnosis present

## 2020-08-14 DIAGNOSIS — Z820 Family history of epilepsy and other diseases of the nervous system: Secondary | ICD-10-CM

## 2020-08-14 DIAGNOSIS — R0602 Shortness of breath: Secondary | ICD-10-CM

## 2020-08-14 DIAGNOSIS — K219 Gastro-esophageal reflux disease without esophagitis: Secondary | ICD-10-CM | POA: Diagnosis present

## 2020-08-14 DIAGNOSIS — E871 Hypo-osmolality and hyponatremia: Secondary | ICD-10-CM | POA: Diagnosis present

## 2020-08-14 DIAGNOSIS — Z79899 Other long term (current) drug therapy: Secondary | ICD-10-CM

## 2020-08-14 DIAGNOSIS — E8881 Metabolic syndrome: Secondary | ICD-10-CM | POA: Diagnosis present

## 2020-08-14 DIAGNOSIS — N179 Acute kidney failure, unspecified: Secondary | ICD-10-CM | POA: Diagnosis not present

## 2020-08-14 DIAGNOSIS — E119 Type 2 diabetes mellitus without complications: Secondary | ICD-10-CM

## 2020-08-14 DIAGNOSIS — E876 Hypokalemia: Secondary | ICD-10-CM | POA: Diagnosis not present

## 2020-08-14 DIAGNOSIS — Z85828 Personal history of other malignant neoplasm of skin: Secondary | ICD-10-CM

## 2020-08-14 DIAGNOSIS — U071 COVID-19: Principal | ICD-10-CM | POA: Diagnosis present

## 2020-08-14 DIAGNOSIS — J1282 Pneumonia due to coronavirus disease 2019: Secondary | ICD-10-CM | POA: Diagnosis present

## 2020-08-14 DIAGNOSIS — R739 Hyperglycemia, unspecified: Secondary | ICD-10-CM | POA: Diagnosis not present

## 2020-08-14 DIAGNOSIS — E1165 Type 2 diabetes mellitus with hyperglycemia: Secondary | ICD-10-CM | POA: Diagnosis present

## 2020-08-14 DIAGNOSIS — Z23 Encounter for immunization: Secondary | ICD-10-CM

## 2020-08-14 DIAGNOSIS — Z833 Family history of diabetes mellitus: Secondary | ICD-10-CM

## 2020-08-14 DIAGNOSIS — F410 Panic disorder [episodic paroxysmal anxiety] without agoraphobia: Secondary | ICD-10-CM | POA: Diagnosis present

## 2020-08-14 DIAGNOSIS — J9601 Acute respiratory failure with hypoxia: Secondary | ICD-10-CM | POA: Diagnosis present

## 2020-08-14 DIAGNOSIS — Z8249 Family history of ischemic heart disease and other diseases of the circulatory system: Secondary | ICD-10-CM

## 2020-08-14 DIAGNOSIS — Z791 Long term (current) use of non-steroidal anti-inflammatories (NSAID): Secondary | ICD-10-CM

## 2020-08-14 DIAGNOSIS — I959 Hypotension, unspecified: Secondary | ICD-10-CM | POA: Diagnosis present

## 2020-08-14 DIAGNOSIS — A419 Sepsis, unspecified organism: Secondary | ICD-10-CM | POA: Diagnosis present

## 2020-08-14 DIAGNOSIS — Z87891 Personal history of nicotine dependence: Secondary | ICD-10-CM

## 2020-08-14 DIAGNOSIS — Z9119 Patient's noncompliance with other medical treatment and regimen: Secondary | ICD-10-CM

## 2020-08-14 LAB — CBC WITH DIFFERENTIAL/PLATELET
Abs Immature Granulocytes: 0.03 10*3/uL (ref 0.00–0.07)
Basophils Absolute: 0 10*3/uL (ref 0.0–0.1)
Basophils Relative: 0 %
Eosinophils Absolute: 0 10*3/uL (ref 0.0–0.5)
Eosinophils Relative: 0 %
HCT: 44.4 % (ref 39.0–52.0)
Hemoglobin: 15.9 g/dL (ref 13.0–17.0)
Immature Granulocytes: 1 %
Lymphocytes Relative: 15 %
Lymphs Abs: 0.6 10*3/uL — ABNORMAL LOW (ref 0.7–4.0)
MCH: 32 pg (ref 26.0–34.0)
MCHC: 35.8 g/dL (ref 30.0–36.0)
MCV: 89.3 fL (ref 80.0–100.0)
Monocytes Absolute: 0.3 10*3/uL (ref 0.1–1.0)
Monocytes Relative: 7 %
Neutro Abs: 3.2 10*3/uL (ref 1.7–7.7)
Neutrophils Relative %: 77 %
Platelets: 163 10*3/uL (ref 150–400)
RBC: 4.97 MIL/uL (ref 4.22–5.81)
RDW: 12.5 % (ref 11.5–15.5)
WBC: 4.1 10*3/uL (ref 4.0–10.5)
nRBC: 0.7 % — ABNORMAL HIGH (ref 0.0–0.2)

## 2020-08-14 LAB — TRIGLYCERIDES: Triglycerides: 275 mg/dL — ABNORMAL HIGH (ref <150)

## 2020-08-14 LAB — D-DIMER, QUANTITATIVE: D-Dimer, Quant: 2.35 ug/mL-FEU — ABNORMAL HIGH (ref 0.00–0.50)

## 2020-08-14 LAB — COMPREHENSIVE METABOLIC PANEL
ALT: 41 U/L (ref 0–44)
AST: 53 U/L — ABNORMAL HIGH (ref 15–41)
Albumin: 2.9 g/dL — ABNORMAL LOW (ref 3.5–5.0)
Alkaline Phosphatase: 63 U/L (ref 38–126)
Anion gap: 17 — ABNORMAL HIGH (ref 5–15)
BUN: 15 mg/dL (ref 6–20)
CO2: 16 mmol/L — ABNORMAL LOW (ref 22–32)
Calcium: 8.5 mg/dL — ABNORMAL LOW (ref 8.9–10.3)
Chloride: 96 mmol/L — ABNORMAL LOW (ref 98–111)
Creatinine, Ser: 1.07 mg/dL (ref 0.61–1.24)
GFR calc Af Amer: >60 mL/min (ref >60)
GFR calc non Af Amer: >60 mL/min (ref >60)
Glucose, Bld: 293 mg/dL — ABNORMAL HIGH (ref 70–99)
Potassium: 3.7 mmol/L (ref 3.5–5.1)
Sodium: 129 mmol/L — ABNORMAL LOW (ref 135–145)
Total Bilirubin: 2 mg/dL — ABNORMAL HIGH (ref 0.3–1.2)
Total Protein: 6.6 g/dL (ref 6.5–8.1)

## 2020-08-14 LAB — LACTIC ACID, PLASMA: Lactic Acid, Venous: 2.2 mmol/L (ref 0.5–1.9)

## 2020-08-14 LAB — RESPIRATORY PANEL BY RT PCR (FLU A&B, COVID)
Influenza A by PCR: NEGATIVE
Influenza B by PCR: NEGATIVE
SARS Coronavirus 2 by RT PCR: POSITIVE — AB

## 2020-08-14 LAB — FIBRINOGEN: Fibrinogen: >800 mg/dL — ABNORMAL HIGH (ref 210–475)

## 2020-08-14 LAB — LACTATE DEHYDROGENASE: LDH: 502 U/L — ABNORMAL HIGH (ref 98–192)

## 2020-08-14 MED ORDER — DEXAMETHASONE SODIUM PHOSPHATE 10 MG/ML IJ SOLN
10.0000 mg | Freq: Once | INTRAMUSCULAR | Status: AC
  Administered 2020-08-14: 10 mg via INTRAVENOUS
  Filled 2020-08-14: qty 1

## 2020-08-14 MED ORDER — HYDROCOD POLST-CPM POLST ER 10-8 MG/5ML PO SUER
5.0000 mL | Freq: Once | ORAL | Status: AC
  Administered 2020-08-15: 5 mL via ORAL
  Filled 2020-08-14: qty 5

## 2020-08-14 MED ORDER — SODIUM CHLORIDE 0.9 % IV SOLN
100.0000 mg | Freq: Every day | INTRAVENOUS | Status: AC
  Administered 2020-08-16 – 2020-08-19 (×4): 100 mg via INTRAVENOUS
  Filled 2020-08-14: qty 20
  Filled 2020-08-14: qty 100
  Filled 2020-08-14 (×2): qty 20

## 2020-08-14 MED ORDER — SODIUM CHLORIDE 0.9 % IV SOLN
200.0000 mg | Freq: Once | INTRAVENOUS | Status: AC
  Administered 2020-08-15: 200 mg via INTRAVENOUS
  Filled 2020-08-14: qty 40

## 2020-08-14 MED ORDER — ACETAMINOPHEN 500 MG PO TABS
1000.0000 mg | ORAL_TABLET | Freq: Once | ORAL | Status: AC
  Administered 2020-08-14: 1000 mg via ORAL
  Filled 2020-08-14: qty 2

## 2020-08-14 MED ORDER — SODIUM CHLORIDE 0.9 % IV SOLN
1000.0000 mL | INTRAVENOUS | Status: DC
  Administered 2020-08-14: 1000 mL via INTRAVENOUS

## 2020-08-14 MED ORDER — IBUPROFEN 400 MG PO TABS
600.0000 mg | ORAL_TABLET | Freq: Once | ORAL | Status: AC
  Administered 2020-08-15: 600 mg via ORAL
  Filled 2020-08-14: qty 1

## 2020-08-14 NOTE — ED Provider Notes (Signed)
[facility] EMERGENCY DEPARTMENT Provider Note   CSN: [id number] Arrival date & time: 08/14/20  2146     History Chief Complaint  Patient presents with  . Shortness of Breath    Joshua Bean is a 44 y.o. male.  Pt presents to the ED today with sob.  Pt has been sick since 9/24.  He's had a cough and sob and a fever.  He has not taken anything for his sx.  Pt called EMS.  His RA O2 sat was 84%.  They put him on a simple mask and his O2 went up to 90%.  Pt has not been vaccinated against Covid.  He did have a known Covid exposure.        Past Medical History:  Diagnosis Date  . Anxiety   . Chicken pox   . Skin cancer of face     Patient Active Problem List   Diagnosis Date Noted  . Idiopathic gout of ankle 06/04/2020  . Erectile dysfunction 06/04/2020  . Anxiety and depression 06/04/2020  . Diabetes mellitus ([place]) 06/07/2016  . HLD (hyperlipidemia) 11/03/2014    Past Surgical History:  Procedure Laterality Date  . BACK SURGERY    . Neck surgery         Family History  Problem Relation Age of Onset  . Heart disease Mother   . Diabetes Mother   . Parkinson's disease Mother   . Heart disease Father   . Diabetes Father   . Cancer Neg Hx   . Stroke Neg Hx     Social History   Tobacco Use  . Smoking status: Never Smoker  . Smokeless tobacco: Current User    Types: Chew  Substance Use Topics  . Alcohol use: Not Currently    Alcohol/week: 0.0 standard drinks  . Drug use: No    Home Medications Prior to Admission medications   Medication Sig Start Date End Date Taking? Authorizing Provider  ALPRAZolam Duanne Moron) 0.5 MG tablet Take 1 tablet (0.5 mg total) by mouth at bedtime as needed for anxiety. 06/04/20   Jearld Fenton, NP  citalopram (CELEXA) 10 MG tablet Take 1 tablet (10 mg total) by mouth daily. 06/04/20   Jearld Fenton, NP  meloxicam (MOBIC) 7.5 MG tablet Take 1 tablet (7.5 mg total) by mouth daily. 06/04/20   Jearld Fenton,  NP  Multiple Vitamin (MULTIVITAMIN) tablet Take 1 tablet by mouth daily.    [provider]  sildenafil (VIAGRA) 25 MG tablet Take 1 tablet (25 mg total) by mouth daily as needed for erectile dysfunction. 06/04/20   Jearld Fenton, NP  simvastatin (ZOCOR) 20 MG tablet Take 1 tablet (20 mg total) by mouth at bedtime. Patient not taking: Reported on 06/04/2020 04/04/16   Jearld Fenton, NP    Allergies    Bee venom  Review of Systems   Review of Systems  Constitutional: Positive for fever.  Respiratory: Positive for cough and shortness of breath.   All other systems reviewed and are negative.   Physical Exam Updated Vital Signs BP 118/70   Pulse (!) 109   Temp (!) 102.9 F (39.4 C) (Oral)   Resp (!) 31   Ht 5\' 11"  (1.803 m)   Wt 95.3 kg   SpO2 95%   BMI 29.29 kg/m   Physical Exam Vitals and nursing note reviewed.  Constitutional:      General: He is in acute distress.     Appearance: He  is well-developed.  HENT:     Head: Normocephalic and atraumatic.     Mouth/Throat:     Mouth: Mucous membranes are dry.  Eyes:     Extraocular Movements: Extraocular movements intact.     Pupils: Pupils are equal, round, and reactive to light.  Cardiovascular:     Rate and Rhythm: Regular rhythm. Tachycardia present.  Pulmonary:     Effort: Tachypnea present.     Breath sounds: Rhonchi present.  Abdominal:     General: Bowel sounds are normal.     Palpations: Abdomen is soft.  Musculoskeletal:        General: Normal range of motion.     Cervical back: Normal range of motion and neck supple.  Skin:    General: Skin is warm.     Capillary Refill: Capillary refill takes less than 2 seconds.  Neurological:     General: No focal deficit present.     Mental Status: He is alert and oriented to person, place, and time.  Psychiatric:        Mood and Affect: Mood normal.        Behavior: Behavior normal.     ED Results / Procedures / Treatments   Labs (all labs ordered  are listed, but only abnormal results are displayed) Labs Reviewed  RESPIRATORY PANEL BY RT PCR (FLU A&B, COVID) - Abnormal; Notable for the following components:      Result Value   SARS Coronavirus 2 by RT PCR POSITIVE (*)    All other components within normal limits  LACTIC ACID, PLASMA - Abnormal; Notable for the following components:   Lactic Acid, Venous 2.2 (*)    All other components within normal limits  CBC WITH DIFFERENTIAL/PLATELET - Abnormal; Notable for the following components:   nRBC 0.7 (*)    Lymphs Abs 0.6 (*)    All other components within normal limits  COMPREHENSIVE METABOLIC PANEL - Abnormal; Notable for the following components:   Sodium 129 (*)    Chloride 96 (*)    CO2 16 (*)    Glucose, Bld 293 (*)    Calcium 8.5 (*)    Albumin 2.9 (*)    AST 53 (*)    Total Bilirubin 2.0 (*)    Anion gap 17 (*)    All other components within normal limits  D-DIMER, QUANTITATIVE (NOT AT [facility]) - Abnormal; Notable for the following components:   D-Dimer, Quant 2.35 (*)    All other components within normal limits  LACTATE DEHYDROGENASE - Abnormal; Notable for the following components:   LDH 502 (*)    All other components within normal limits  FIBRINOGEN - Abnormal; Notable for the following components:   Fibrinogen >800 (*)    All other components within normal limits  TRIGLYCERIDES - Abnormal; Notable for the following components:   Triglycerides 275 (*)    All other components within normal limits  CULTURE, BLOOD (ROUTINE X 2)  CULTURE, BLOOD (ROUTINE X 2)  LACTIC ACID, PLASMA  PROCALCITONIN  FERRITIN  C-REACTIVE PROTEIN    EKG None ST rate 114.  No STEMI.  QTc 456  Radiology DG Chest Port 1 View  Result Date: 08/14/2020 CLINICAL DATA:  Shortness of breath.  COVID positive. EXAM: PORTABLE CHEST 1 VIEW COMPARISON:  06/05/2016 FINDINGS: Lung volumes are low. Patchy heterogeneous bilateral airspace opacities in a mid-lower lung zone predominant distribution.  Normal heart size for technique. No evidence of pneumomediastinum. No significant pleural effusion. No pneumothorax. No  acute osseous abnormalities are seen. Overlying oxygen tubing and EKG leads. IMPRESSION: Patchy heterogeneous bilateral airspace opacities consistent with COVID-19 pneumonia. Electronically Signed   By: Keith Rake M.D.   On: 08/14/2020 22:27    Procedures Procedures (including critical care time)  Medications Ordered in ED Medications  0.9 %  sodium chloride infusion (1,000 mLs Intravenous New Bag/Given 08/14/20 2226)  remdesivir 200 mg in sodium chloride 0.9% 250 mL IVPB (has no administration in time range)    Followed by  remdesivir 100 mg in sodium chloride 0.9 % 100 mL IVPB (has no administration in time range)  insulin aspart (novoLOG) injection 8 Units (has no administration in time range)  acetaminophen (TYLENOL) tablet 1,000 mg (1,000 mg Oral Given 08/14/20 2228)  dexamethasone (DECADRON) injection 10 mg (10 mg Intravenous Given 08/14/20 2230)  chlorpheniramine-HYDROcodone (TUSSIONEX) 10-8 MG/5ML suspension 5 mL (5 mLs Oral Given 08/15/20 0001)  ibuprofen (ADVIL) tablet 600 mg (600 mg Oral Given 08/15/20 0001)    ED Course  I have reviewed the triage vital signs and the nursing notes.  Pertinent labs & imaging results that were available during my care of the patient were reviewed by me and considered in my medical decision making (see chart for details).    MDM Rules/Calculators/A&P                          On 6L, pt's O2 sat 84%.  O2 sats would not stay above 90 unless he was on 15 L NRB.  I was able to wean down to 10 L  Pt does have Covid.  Pharmacy consulted for Remdesivir.  Pt also given decadron.  BS is elevated.  He has a hx of DM, but lost weight.  He has not been on any meds for several years.  Hgb a1c was 11 in July, but it does not look like he was put on any meds.  Diet and exercise recommended.  Pt given IV insulin.   CRITICAL  CARE Performed by: Isla Pence   Total critical care time: 30 minutes  Critical care time was exclusive of separately billable procedures and treating other patients.  Critical care was necessary to treat or prevent imminent or life-threatening deterioration.  Critical care was time spent personally by me on the following activities: development of treatment plan with patient and/or surrogate as well as nursing, discussions with consultants, evaluation of patient's response to treatment, examination of patient, obtaining history from patient or surrogate, ordering and performing treatments and interventions, ordering and review of laboratory studies, ordering and review of radiographic studies, pulse oximetry and re-evaluation of patient's condition.  KENDALE REMBOLD was evaluated in Emergency Department on 08/15/2020 for the symptoms described in the history of present illness. He was evaluated in the context of the global COVID-19 pandemic, which necessitated consideration that the patient might be at risk for infection with the SARS-CoV-2 virus that causes COVID-19. Institutional protocols and algorithms that pertain to the evaluation of patients at risk for COVID-19 are in a state of rapid change based on information released by regulatory bodies including the CDC and federal and state organizations. These policies and algorithms were followed during the patient's care in the ED.  Final Clinical Impression(s) / ED Diagnoses Final diagnoses:  Pneumonia due to COVID-19 virus  Acute respiratory failure with hypoxia ([place])  Hyperglycemia    Rx / DC Orders ED Discharge Orders    None  Isla Pence, MD 08/15/20 [phone]

## 2020-08-14 NOTE — ED Triage Notes (Addendum)
Pt bib [facility] w/ c/o sob, fever, headache, cough that started Friday. Pt had known covid exposure, has not yet been tested. Pt 84% on RA w/ EMS, placed on 6 lpm o2 via simple mask w/ spo2 up to 90%. HR 116, BP 124/70, RR 24-26

## 2020-08-15 DIAGNOSIS — J1282 Pneumonia due to coronavirus disease 2019: Secondary | ICD-10-CM | POA: Diagnosis present

## 2020-08-15 DIAGNOSIS — E876 Hypokalemia: Secondary | ICD-10-CM | POA: Diagnosis not present

## 2020-08-15 DIAGNOSIS — Z820 Family history of epilepsy and other diseases of the nervous system: Secondary | ICD-10-CM | POA: Diagnosis not present

## 2020-08-15 DIAGNOSIS — K219 Gastro-esophageal reflux disease without esophagitis: Secondary | ICD-10-CM | POA: Diagnosis present

## 2020-08-15 DIAGNOSIS — E860 Dehydration: Secondary | ICD-10-CM | POA: Diagnosis present

## 2020-08-15 DIAGNOSIS — I959 Hypotension, unspecified: Secondary | ICD-10-CM | POA: Diagnosis present

## 2020-08-15 DIAGNOSIS — N179 Acute kidney failure, unspecified: Secondary | ICD-10-CM | POA: Diagnosis not present

## 2020-08-15 DIAGNOSIS — Z23 Encounter for immunization: Secondary | ICD-10-CM | POA: Diagnosis not present

## 2020-08-15 DIAGNOSIS — Z87891 Personal history of nicotine dependence: Secondary | ICD-10-CM | POA: Diagnosis not present

## 2020-08-15 DIAGNOSIS — Z85828 Personal history of other malignant neoplasm of skin: Secondary | ICD-10-CM | POA: Diagnosis not present

## 2020-08-15 DIAGNOSIS — J9601 Acute respiratory failure with hypoxia: Secondary | ICD-10-CM | POA: Diagnosis present

## 2020-08-15 DIAGNOSIS — F32A Depression, unspecified: Secondary | ICD-10-CM | POA: Diagnosis present

## 2020-08-15 DIAGNOSIS — E8881 Metabolic syndrome: Secondary | ICD-10-CM | POA: Diagnosis present

## 2020-08-15 DIAGNOSIS — Z791 Long term (current) use of non-steroidal anti-inflammatories (NSAID): Secondary | ICD-10-CM | POA: Diagnosis not present

## 2020-08-15 DIAGNOSIS — U071 COVID-19: Secondary | ICD-10-CM | POA: Diagnosis present

## 2020-08-15 DIAGNOSIS — R739 Hyperglycemia, unspecified: Secondary | ICD-10-CM | POA: Diagnosis present

## 2020-08-15 DIAGNOSIS — Z8249 Family history of ischemic heart disease and other diseases of the circulatory system: Secondary | ICD-10-CM | POA: Diagnosis not present

## 2020-08-15 DIAGNOSIS — Z79899 Other long term (current) drug therapy: Secondary | ICD-10-CM | POA: Diagnosis not present

## 2020-08-15 DIAGNOSIS — Z9119 Patient's noncompliance with other medical treatment and regimen: Secondary | ICD-10-CM | POA: Diagnosis not present

## 2020-08-15 DIAGNOSIS — R7989 Other specified abnormal findings of blood chemistry: Secondary | ICD-10-CM | POA: Diagnosis not present

## 2020-08-15 DIAGNOSIS — F410 Panic disorder [episodic paroxysmal anxiety] without agoraphobia: Secondary | ICD-10-CM | POA: Diagnosis present

## 2020-08-15 DIAGNOSIS — E785 Hyperlipidemia, unspecified: Secondary | ICD-10-CM | POA: Diagnosis present

## 2020-08-15 DIAGNOSIS — Z833 Family history of diabetes mellitus: Secondary | ICD-10-CM | POA: Diagnosis not present

## 2020-08-15 DIAGNOSIS — A419 Sepsis, unspecified organism: Secondary | ICD-10-CM | POA: Diagnosis present

## 2020-08-15 DIAGNOSIS — E1165 Type 2 diabetes mellitus with hyperglycemia: Secondary | ICD-10-CM | POA: Diagnosis present

## 2020-08-15 DIAGNOSIS — E871 Hypo-osmolality and hyponatremia: Secondary | ICD-10-CM | POA: Diagnosis present

## 2020-08-15 LAB — GLUCOSE, CAPILLARY
Glucose-Capillary: 368 mg/dL — ABNORMAL HIGH (ref 70–99)
Glucose-Capillary: 279 mg/dL — ABNORMAL HIGH (ref 70–99)

## 2020-08-15 LAB — URINALYSIS, ROUTINE W REFLEX MICROSCOPIC
Bilirubin Urine: NEGATIVE
Glucose, UA: >=500 mg/dL — AB
Hgb urine dipstick: NEGATIVE
Ketones, ur: 80 mg/dL — AB
Leukocytes,Ua: NEGATIVE
Nitrite: NEGATIVE
Protein, ur: 30 mg/dL — AB
Specific Gravity, Urine: 1.03 (ref 1.005–1.030)
pH: 5 (ref 5.0–8.0)

## 2020-08-15 LAB — HEMOGLOBIN A1C
Hgb A1c MFr Bld: 10.8 % — ABNORMAL HIGH (ref 4.8–5.6)
Mean Plasma Glucose: 263.26 mg/dL

## 2020-08-15 LAB — BASIC METABOLIC PANEL
Anion gap: 20 — ABNORMAL HIGH (ref 5–15)
Anion gap: 17 — ABNORMAL HIGH (ref 5–15)
Anion gap: 12 (ref 5–15)
BUN: 23 mg/dL — ABNORMAL HIGH (ref 6–20)
BUN: 26 mg/dL — ABNORMAL HIGH (ref 6–20)
BUN: 25 mg/dL — ABNORMAL HIGH (ref 6–20)
CO2: 13 mmol/L — ABNORMAL LOW (ref 22–32)
CO2: 15 mmol/L — ABNORMAL LOW (ref 22–32)
CO2: 18 mmol/L — ABNORMAL LOW (ref 22–32)
Calcium: 8 mg/dL — ABNORMAL LOW (ref 8.9–10.3)
Calcium: 8.6 mg/dL — ABNORMAL LOW (ref 8.9–10.3)
Calcium: 8.2 mg/dL — ABNORMAL LOW (ref 8.9–10.3)
Chloride: 99 mmol/L (ref 98–111)
Chloride: 102 mmol/L (ref 98–111)
Chloride: 103 mmol/L (ref 98–111)
Creatinine, Ser: 1.25 mg/dL — ABNORMAL HIGH (ref 0.61–1.24)
Creatinine, Ser: 0.99 mg/dL (ref 0.61–1.24)
Creatinine, Ser: 0.99 mg/dL (ref 0.61–1.24)
GFR calc Af Amer: >60 mL/min (ref >60)
GFR calc Af Amer: >60 mL/min (ref >60)
GFR calc Af Amer: >60 mL/min (ref >60)
GFR calc non Af Amer: >60 mL/min (ref >60)
GFR calc non Af Amer: >60 mL/min (ref >60)
GFR calc non Af Amer: >60 mL/min (ref >60)
Glucose, Bld: 361 mg/dL — ABNORMAL HIGH (ref 70–99)
Glucose, Bld: 396 mg/dL — ABNORMAL HIGH (ref 70–99)
Glucose, Bld: 384 mg/dL — ABNORMAL HIGH (ref 70–99)
Potassium: 4.1 mmol/L (ref 3.5–5.1)
Potassium: 3.7 mmol/L (ref 3.5–5.1)
Potassium: 3.4 mmol/L — ABNORMAL LOW (ref 3.5–5.1)
Sodium: 132 mmol/L — ABNORMAL LOW (ref 135–145)
Sodium: 134 mmol/L — ABNORMAL LOW (ref 135–145)
Sodium: 133 mmol/L — ABNORMAL LOW (ref 135–145)

## 2020-08-15 LAB — LACTIC ACID, PLASMA: Lactic Acid, Venous: 1.5 mmol/L (ref 0.5–1.9)

## 2020-08-15 LAB — CBG MONITORING, ED
Glucose-Capillary: 393 mg/dL — ABNORMAL HIGH (ref 70–99)
Glucose-Capillary: 361 mg/dL — ABNORMAL HIGH (ref 70–99)

## 2020-08-15 LAB — BETA-HYDROXYBUTYRIC ACID
Beta-Hydroxybutyric Acid: 3.48 mmol/L — ABNORMAL HIGH (ref 0.05–0.27)
Beta-Hydroxybutyric Acid: 2.78 mmol/L — ABNORMAL HIGH (ref 0.05–0.27)

## 2020-08-15 LAB — MRSA PCR SCREENING: MRSA by PCR: NEGATIVE

## 2020-08-15 LAB — BRAIN NATRIURETIC PEPTIDE: B Natriuretic Peptide: 37.1 pg/mL (ref 0.0–100.0)

## 2020-08-15 LAB — C-REACTIVE PROTEIN: CRP: 20.5 mg/dL — ABNORMAL HIGH (ref <1.0)

## 2020-08-15 LAB — MAGNESIUM: Magnesium: 2.2 mg/dL (ref 1.7–2.4)

## 2020-08-15 LAB — HIV ANTIBODY (ROUTINE TESTING W REFLEX): HIV Screen 4th Generation wRfx: NONREACTIVE

## 2020-08-15 LAB — PROCALCITONIN
Procalcitonin: 0.51 ng/mL
Procalcitonin: 0.62 ng/mL

## 2020-08-15 LAB — FERRITIN: Ferritin: 3479 ng/mL — ABNORMAL HIGH (ref 24–336)

## 2020-08-15 MED ORDER — ENOXAPARIN SODIUM 40 MG/0.4ML ~~LOC~~ SOLN
40.0000 mg | Freq: Every day | SUBCUTANEOUS | Status: DC
  Administered 2020-08-15 – 2020-08-18 (×4): 40 mg via SUBCUTANEOUS
  Filled 2020-08-15 (×4): qty 0.4

## 2020-08-15 MED ORDER — ALBUTEROL SULFATE HFA 108 (90 BASE) MCG/ACT IN AERS
2.0000 | INHALATION_SPRAY | Freq: Four times a day (QID) | RESPIRATORY_TRACT | Status: DC | PRN
  Administered 2020-08-15 – 2020-08-25 (×7): 2 via RESPIRATORY_TRACT
  Filled 2020-08-15: qty 6.7

## 2020-08-15 MED ORDER — POTASSIUM CHLORIDE CRYS ER 20 MEQ PO TBCR
40.0000 meq | EXTENDED_RELEASE_TABLET | Freq: Once | ORAL | Status: AC
  Administered 2020-08-15: 40 meq via ORAL
  Filled 2020-08-15: qty 2

## 2020-08-15 MED ORDER — ASPIRIN 81 MG PO CHEW
81.0000 mg | CHEWABLE_TABLET | Freq: Every day | ORAL | Status: DC
  Administered 2020-08-15 – 2020-08-30 (×16): 81 mg via ORAL
  Filled 2020-08-15 (×16): qty 1

## 2020-08-15 MED ORDER — ATORVASTATIN CALCIUM 40 MG PO TABS
40.0000 mg | ORAL_TABLET | Freq: Every day | ORAL | Status: DC
  Administered 2020-08-15 – 2020-08-30 (×16): 40 mg via ORAL
  Filled 2020-08-15: qty 1
  Filled 2020-08-15: qty 4
  Filled 2020-08-15 (×14): qty 1

## 2020-08-15 MED ORDER — VITAMIN D 25 MCG (1000 UNIT) PO TABS
1000.0000 [IU] | ORAL_TABLET | Freq: Every day | ORAL | Status: DC
  Administered 2020-08-15 – 2020-08-30 (×16): 1000 [IU] via ORAL
  Filled 2020-08-15 (×16): qty 1

## 2020-08-15 MED ORDER — LEVOFLOXACIN 500 MG PO TABS
500.0000 mg | ORAL_TABLET | Freq: Every day | ORAL | Status: AC
  Administered 2020-08-16 – 2020-08-20 (×5): 500 mg via ORAL
  Filled 2020-08-15 (×5): qty 1

## 2020-08-15 MED ORDER — ADULT MULTIVITAMIN W/MINERALS CH
1.0000 | ORAL_TABLET | Freq: Every day | ORAL | Status: DC
  Administered 2020-08-15 – 2020-08-30 (×16): 1 via ORAL
  Filled 2020-08-15 (×16): qty 1

## 2020-08-15 MED ORDER — METHYLPREDNISOLONE SODIUM SUCC 125 MG IJ SOLR: 60.0000 mg | Freq: Two times a day (BID) | INTRAMUSCULAR | Status: DC

## 2020-08-15 MED ORDER — INSULIN GLARGINE 100 UNIT/ML ~~LOC~~ SOLN: 25.0000 [IU] | Freq: Every day | SUBCUTANEOUS | Status: DC

## 2020-08-15 MED ORDER — PREDNISONE 20 MG PO TABS: 50.0000 mg | ORAL_TABLET | Freq: Every day | ORAL | Status: DC

## 2020-08-15 MED ORDER — ZINC SULFATE 220 (50 ZN) MG PO CAPS
220.0000 mg | ORAL_CAPSULE | Freq: Every day | ORAL | Status: DC
  Administered 2020-08-15 – 2020-08-30 (×16): 220 mg via ORAL
  Filled 2020-08-15 (×16): qty 1

## 2020-08-15 MED ORDER — ASCORBIC ACID 500 MG PO TABS
500.0000 mg | ORAL_TABLET | Freq: Every day | ORAL | Status: DC
  Administered 2020-08-15 – 2020-08-30 (×16): 500 mg via ORAL
  Filled 2020-08-15 (×16): qty 1

## 2020-08-15 MED ORDER — METHYLPREDNISOLONE SODIUM SUCC 40 MG IJ SOLR: 40.0000 mg | Freq: Two times a day (BID) | INTRAMUSCULAR | Status: DC

## 2020-08-15 MED ORDER — INSULIN GLARGINE 100 UNIT/ML ~~LOC~~ SOLN
10.0000 [IU] | Freq: Once | SUBCUTANEOUS | Status: AC
  Administered 2020-08-15: 10 [IU] via SUBCUTANEOUS
  Filled 2020-08-15 (×2): qty 0.1

## 2020-08-15 MED ORDER — INSULIN ASPART 100 UNIT/ML ~~LOC~~ SOLN
8.0000 [IU] | Freq: Once | SUBCUTANEOUS | Status: AC
  Administered 2020-08-15: 8 [IU] via INTRAVENOUS

## 2020-08-15 MED ORDER — SODIUM CHLORIDE 0.9 % IV SOLN
500.0000 mg | Freq: Every day | INTRAVENOUS | Status: DC
  Administered 2020-08-15: 500 mg via INTRAVENOUS
  Filled 2020-08-15: qty 500

## 2020-08-15 MED ORDER — GUAIFENESIN-DM 100-10 MG/5ML PO SYRP
10.0000 mL | ORAL_SOLUTION | ORAL | Status: DC | PRN
  Administered 2020-08-15 – 2020-08-28 (×16): 10 mL via ORAL
  Filled 2020-08-15 (×18): qty 10

## 2020-08-15 MED ORDER — INSULIN GLARGINE 100 UNIT/ML ~~LOC~~ SOLN
30.0000 [IU] | Freq: Every day | SUBCUTANEOUS | Status: DC
  Administered 2020-08-15: 30 [IU] via SUBCUTANEOUS
  Filled 2020-08-15: qty 0.3

## 2020-08-15 MED ORDER — METHYLPREDNISOLONE SODIUM SUCC 40 MG IJ SOLR
40.0000 mg | Freq: Two times a day (BID) | INTRAMUSCULAR | Status: AC
  Administered 2020-08-15 – 2020-08-19 (×9): 40 mg via INTRAVENOUS
  Filled 2020-08-15 (×9): qty 1

## 2020-08-15 MED ORDER — ALPRAZOLAM 0.25 MG PO TABS: 0.5000 mg | ORAL_TABLET | Freq: Every evening | ORAL | Status: DC | PRN

## 2020-08-15 MED ORDER — SODIUM CHLORIDE 0.9 % IV SOLN
1.0000 g | Freq: Every day | INTRAVENOUS | Status: DC
  Administered 2020-08-15: 1 g via INTRAVENOUS
  Filled 2020-08-15: qty 10

## 2020-08-15 MED ORDER — LIVING WELL WITH DIABETES BOOK
Freq: Once | Status: DC
  Filled 2020-08-15: qty 1

## 2020-08-15 MED ORDER — INSULIN ASPART 100 UNIT/ML ~~LOC~~ SOLN
4.0000 [IU] | Freq: Three times a day (TID) | SUBCUTANEOUS | Status: DC
  Administered 2020-08-15 (×3): 4 [IU] via SUBCUTANEOUS

## 2020-08-15 MED ORDER — INSULIN STARTER KIT- PEN NEEDLES (ENGLISH)
1.0000 | Freq: Once | Status: DC
  Filled 2020-08-15: qty 1

## 2020-08-15 MED ORDER — INSULIN GLARGINE 100 UNIT/ML ~~LOC~~ SOLN
40.0000 [IU] | Freq: Every day | SUBCUTANEOUS | Status: DC
  Administered 2020-08-16 – 2020-08-21 (×6): 40 [IU] via SUBCUTANEOUS
  Filled 2020-08-15 (×6): qty 0.4

## 2020-08-15 MED ORDER — LACTATED RINGERS IV SOLN: INTRAVENOUS | Status: DC

## 2020-08-15 MED ORDER — HYDROCOD POLST-CPM POLST ER 10-8 MG/5ML PO SUER
5.0000 mL | Freq: Two times a day (BID) | ORAL | Status: DC | PRN
  Administered 2020-08-15 – 2020-08-23 (×5): 5 mL via ORAL
  Filled 2020-08-15 (×6): qty 5

## 2020-08-15 MED ORDER — ACETAMINOPHEN 325 MG PO TABS
650.0000 mg | ORAL_TABLET | Freq: Four times a day (QID) | ORAL | Status: DC | PRN
  Administered 2020-08-15 – 2020-08-28 (×2): 650 mg via ORAL
  Filled 2020-08-15 (×2): qty 2

## 2020-08-15 MED ORDER — INSULIN ASPART 100 UNIT/ML ~~LOC~~ SOLN
0.0000 [IU] | Freq: Three times a day (TID) | SUBCUTANEOUS | Status: DC
  Administered 2020-08-15 (×3): 20 [IU] via SUBCUTANEOUS
  Administered 2020-08-16 (×2): 11 [IU] via SUBCUTANEOUS
  Administered 2020-08-16 – 2020-08-17 (×3): 7 [IU] via SUBCUTANEOUS
  Administered 2020-08-17: 4 [IU] via SUBCUTANEOUS
  Administered 2020-08-18: 7 [IU] via SUBCUTANEOUS
  Administered 2020-08-18: 4 [IU] via SUBCUTANEOUS
  Administered 2020-08-18 – 2020-08-19 (×2): 7 [IU] via SUBCUTANEOUS
  Administered 2020-08-19 – 2020-08-20 (×2): 3 [IU] via SUBCUTANEOUS
  Administered 2020-08-21: 15 [IU] via SUBCUTANEOUS
  Administered 2020-08-21: 4 [IU] via SUBCUTANEOUS
  Administered 2020-08-22 (×2): 7 [IU] via SUBCUTANEOUS
  Administered 2020-08-22: 3 [IU] via SUBCUTANEOUS
  Administered 2020-08-23: 11 [IU] via SUBCUTANEOUS
  Administered 2020-08-23: 3 [IU] via SUBCUTANEOUS
  Administered 2020-08-24: 7 [IU] via SUBCUTANEOUS
  Administered 2020-08-24: 11 [IU] via SUBCUTANEOUS
  Administered 2020-08-24: 7 [IU] via SUBCUTANEOUS
  Administered 2020-08-25: 11 [IU] via SUBCUTANEOUS
  Administered 2020-08-25: 3 [IU] via SUBCUTANEOUS
  Administered 2020-08-26: 5 [IU] via SUBCUTANEOUS
  Administered 2020-08-26 – 2020-08-27 (×2): 11 [IU] via SUBCUTANEOUS
  Administered 2020-08-27 (×2): 3 [IU] via SUBCUTANEOUS
  Administered 2020-08-28: 7 [IU] via SUBCUTANEOUS
  Administered 2020-08-28: 4 [IU] via SUBCUTANEOUS
  Administered 2020-08-28: 3 [IU] via SUBCUTANEOUS
  Administered 2020-08-29: 7 [IU] via SUBCUTANEOUS
  Administered 2020-08-29: 11 [IU] via SUBCUTANEOUS
  Administered 2020-08-30: 7 [IU] via SUBCUTANEOUS

## 2020-08-15 MED ORDER — METHYLPREDNISOLONE SODIUM SUCC 125 MG IJ SOLR: 0.5000 mg/kg | Freq: Two times a day (BID) | INTRAMUSCULAR | Status: DC

## 2020-08-15 MED ORDER — BARICITINIB 2 MG PO TABS
4.0000 mg | ORAL_TABLET | Freq: Every day | ORAL | Status: AC
  Administered 2020-08-15 – 2020-08-28 (×14): 4 mg via ORAL
  Filled 2020-08-15 (×14): qty 2

## 2020-08-15 MED ORDER — INSULIN ASPART 100 UNIT/ML ~~LOC~~ SOLN
0.0000 [IU] | Freq: Every day | SUBCUTANEOUS | Status: DC
  Administered 2020-08-15: 3 [IU] via SUBCUTANEOUS
  Administered 2020-08-17 – 2020-08-22 (×4): 2 [IU] via SUBCUTANEOUS
  Administered 2020-08-23: 4 [IU] via SUBCUTANEOUS
  Administered 2020-08-24 – 2020-08-25 (×2): 2 [IU] via SUBCUTANEOUS
  Administered 2020-08-26: 3 [IU] via SUBCUTANEOUS
  Administered 2020-08-27: 2 [IU] via SUBCUTANEOUS
  Administered 2020-08-28: 3 [IU] via SUBCUTANEOUS

## 2020-08-15 MED ORDER — ALPRAZOLAM 0.5 MG PO TABS
0.5000 mg | ORAL_TABLET | Freq: Two times a day (BID) | ORAL | Status: DC | PRN
  Administered 2020-08-15 – 2020-08-20 (×7): 0.5 mg via ORAL
  Filled 2020-08-15: qty 1
  Filled 2020-08-15: qty 2
  Filled 2020-08-15 (×5): qty 1

## 2020-08-15 MED ORDER — SODIUM CHLORIDE 0.9 % IV BOLUS
1000.0000 mL | Freq: Once | INTRAVENOUS | Status: AC
  Administered 2020-08-15: 1000 mL via INTRAVENOUS

## 2020-08-15 NOTE — ED Notes (Signed)
PT highly anxious.  States he's scared he is going to die.  Re-assured he is in better shape than when he arrived. Will give prn xanax.

## 2020-08-15 NOTE — ED Notes (Signed)
Pt weaned down to 6 lpm o2 via [place] from 10 lpm o2 via NRB and pt has maintained spo2 90-94% on 6 lpm over the course of 1 hour. Will continue to monitor.

## 2020-08-15 NOTE — Progress Notes (Signed)
PROGRESS NOTE                                                                                                                                                                                                             Patient Demographics:    Joshua Bean, is a 44 y.o. male, DOB - 06/21/1976, RAQ:[id number]  Outpatient Primary MD for the patient is Jearld Fenton, NP    LOS - 0  Admit date - 08/14/2020    Chief Complaint  Patient presents with  . Shortness of Breath       Brief Narrative - Joshua Bean is a 44 y.o. male with medical history significant of anxiety, hyperlipidemia, type 2 diabetes not on any medications presenting with a chief complaint of shortness of breath.  He was diagnosed with acute hypoxic respiratory failure due to DKA along with poorly controlled blood sugars likely early DKA and he was admitted to the hospital.   Subjective:    Joshua Bean today has, No headache, No chest pain, No abdominal pain - No Nausea, No new weakness tingling or numbness, mild SOB.   Assessment  & Plan :     1. Acute Hypoxic Resp. Failure due to Acute Covid 19 Viral Pneumonitis during the ongoing 2020 Covid 19 Pandemic - he is unfortunately unvaccinated and he has incurred moderate to severe parenchymal lung injury, has been started on combination of IV steroids, baricitinib and steroids.  Continue to monitor.  Overall tenuous.  Encouraged the patient to sit up in chair in the daytime use I-S and flutter valve for pulmonary toiletry and then prone in bed when at night.  Will advance activity and titrate down oxygen as possible.   SpO2: 95 % O2 Flow Rate (L/min): 10 L/min  Recent Labs  Lab 08/14/20 2157 08/14/20 2207 08/14/20 2227 08/15/20 0315  WBC 4.1  --   --   --   PLT 163  --   --   --   CRP 20.5*  --   --   --   BNP  --   --   --  37.1  DDIMER 2.35*  --   --   --   PROCALCITON 0.51  --   --  0.62  AST  53*  --   --   --   ALT  41  --   --   --   ALKPHOS 63  --   --   --   BILITOT 2.0*  --   --   --   ALBUMIN 2.9*  --   --   --   LATICACIDVEN  --   --  2.2* 1.5  SARSCOV2NAA  --  POSITIVE*  --   --     2.  Asymptomatic viral infection induced transaminitis.  Trend.  3.  Dyslipidemia.  He is on statin.  4.  Anxiety and depression.  On PRN  home medications.  5.  DM type II.  On diet control, poor outpatient control due to hyperglycemia, could be in early DKA, placed on Lantus, premeal and sliding scale for now, DM and insulin education.  Repeat BMP if suggestive of DKA will be placed on combination of Lantus and insulin drip.  Lab Results  Component Value Date   HGBA1C 10.8 (H) 08/15/2020   CBG (last 3)  Recent Labs    08/15/20 0852  GLUCAP 393*       Condition -  Guarded  Family Communication  :  None  Code Status :  Full  Consults  :  None  Procedures  :  None  PUD Prophylaxis : None  Disposition Plan  :    Status is: Inpatient  Remains inpatient appropriate because:IV treatments appropriate due to intensity of illness or inability to take PO   Dispo: The patient is from: Home              Anticipated d/c is to: Home              Anticipated d/c date is: > 3 days              Patient currently is not medically stable to d/c.   DVT Prophylaxis  :  Lovenox   Lab Results  Component Value Date   PLT 163 08/14/2020    Diet :  Diet Order            Diet heart healthy/carb modified Room service appropriate? Yes; Fluid consistency: Thin  Diet effective now                  Inpatient Medications  Scheduled Meds: . vitamin C  500 mg Oral Daily  . aspirin  81 mg Oral Daily  . atorvastatin  40 mg Oral Daily  . baricitinib  4 mg Oral Daily  . cholecalciferol  1,000 Units Oral Daily  . enoxaparin (LOVENOX) injection  40 mg Subcutaneous Q0600  . insulin aspart  0-20 Units Subcutaneous TID WC  . insulin aspart  0-5 Units Subcutaneous QHS  . insulin  aspart  4 Units Subcutaneous TID WC  . insulin glargine  30 Units Subcutaneous Daily  . [START ON 08/16/2020] levofloxacin  500 mg Oral Daily  . methylPREDNISolone (SOLU-MEDROL) injection  40 mg Intravenous Q12H  . zinc sulfate  220 mg Oral Daily   Continuous Infusions: . lactated ringers 100 mL/hr at 08/15/20 0902  . [START ON 08/16/2020] remdesivir 100 mg in NS 100 mL     PRN Meds:.acetaminophen, albuterol, chlorpheniramine-HYDROcodone, guaiFENesin-dextromethorphan  Antibiotics  :    Anti-infectives (From admission, onward)   Start     Dose/Rate Route Frequency Ordered Stop   08/16/20 1000  remdesivir 100 mg in sodium chloride 0.9 % 100 mL IVPB       "Followed by" Linked Group Details   100 mg 200  mL/hr over 30 Minutes Intravenous Daily 08/14/20 2355 08/20/20 0959   08/16/20 1000  levofloxacin (LEVAQUIN) tablet 500 mg        500 mg Oral Daily 08/15/20 0720 08/21/20 0959   08/15/20 0245  cefTRIAXone (ROCEPHIN) 1 g in sodium chloride 0.9 % 100 mL IVPB  Status:  Discontinued        1 g 200 mL/hr over 30 Minutes Intravenous Daily 08/15/20 0233 08/15/20 0720   08/15/20 0245  azithromycin (ZITHROMAX) 500 mg in sodium chloride 0.9 % 250 mL IVPB  Status:  Discontinued        500 mg 250 mL/hr over 60 Minutes Intravenous Daily 08/15/20 0233 08/15/20 0720   08/15/20 0100  remdesivir 200 mg in sodium chloride 0.9% 250 mL IVPB       "Followed by" Linked Group Details   200 mg 580 mL/hr over 30 Minutes Intravenous Once 08/14/20 2355 08/15/20 0232       Time Spent in minutes  30   Lala Lund M.D on 08/15/2020 at 11:48 AM  To page go to [email or web address] - password [facility]  [facility] -  Office  [phone]    See all Orders from today for further details    Objective:   Vitals:   08/15/20 0530 08/15/20 0700 08/15/20 0713 08/15/20 0715  BP: 98/70 106/72  106/80  Pulse: 86 86 94 95  Resp: 20 (!) 21 17 15   Temp:      TempSrc:      SpO2: 90% (!) 89% 91% 95%  Weight:        Height:        Wt Readings from Last 3 Encounters:  08/14/20 95.3 kg  06/04/20 (!) 95.3 kg  11/15/16 105.2 kg    No intake or output data in the 24 hours ending 08/15/20 1148   Physical Exam  Awake Alert, No new F.N deficits, Normal affect [place].AT,PERRAL Supple Neck,No JVD, No cervical lymphadenopathy appriciated.  Symmetrical Chest wall movement, Good air movement bilaterally, CTAB RRR,No Gallops,Rubs or new Murmurs, No Parasternal Heave +ve B.Sounds, Abd Soft, No tenderness, No organomegaly appriciated, No rebound - guarding or rigidity. No Cyanosis, Clubbing or edema, No new Rash or bruise     Data Review:    CBC Recent Labs  Lab 08/14/20 2157  WBC 4.1  HGB 15.9  HCT 44.4  PLT 163  MCV 89.3  MCH 32.0  MCHC 35.8  RDW 12.5  LYMPHSABS 0.6*  MONOABS 0.3  EOSABS 0.0  BASOSABS 0.0    Recent Labs  Lab 08/14/20 2157 08/14/20 2227 08/15/20 0226 08/15/20 0315  NA 129*  --  132*  --   K 3.7  --  4.1  --   CL 96*  --  99  --   CO2 16*  --  13*  --   GLUCOSE 293*  --  361*  --   BUN 15  --  23*  --   CREATININE 1.07  --  1.25*  --   CALCIUM 8.5*  --  8.0*  --   AST 53*  --   --   --   ALT 41  --   --   --   ALKPHOS 63  --   --   --   BILITOT 2.0*  --   --   --   ALBUMIN 2.9*  --   --   --   MG  --   --   --  2.2  CRP 20.5*  --   --   --  DDIMER 2.35*  --   --   --   PROCALCITON 0.51  --   --  0.62  LATICACIDVEN  --  2.2*  --  1.5  HGBA1C  --   --   --  10.8*  BNP  --   --   --  37.1    ------------------------------------------------------------------------------------------------------------------ Recent Labs    08/14/20 2157  TRIG 275*    Lab Results  Component Value Date   HGBA1C 10.8 (H) 08/15/2020   ------------------------------------------------------------------------------------------------------------------ No results for input(s): TSH, T4TOTAL, T3FREE, THYROIDAB in the last 72 hours.  Invalid input(s): FREET3  Cardiac  Enzymes No results for input(s): CKMB, TROPONINI, MYOGLOBIN in the last 168 hours.  Invalid input(s): CK ------------------------------------------------------------------------------------------------------------------    Component Value Date/Time   BNP 37.1 08/15/2020 0315    Micro Results Recent Results (from the past 240 hour(s))  Respiratory Panel by RT PCR (Flu A&B, Covid) - Nasopharyngeal Swab     Status: Abnormal   Collection Time: 08/14/20 10:07 PM   Specimen: Nasopharyngeal Swab  Result Value Ref Range Status   SARS Coronavirus 2 by RT PCR POSITIVE (A) NEGATIVE Final    Comment: RESULT CALLED TO, READ BACK BY AND VERIFIED WITH: Aileen Pilot MD 08/14/20 AT 2315 SK  (NOTE) SARS-CoV-2 target nucleic acids are DETECTED.  SARS-CoV-2 RNA is generally detectable in upper respiratory specimens  during the acute phase of infection. Positive results are indicative of the presence of the identified virus, but do not rule out bacterial infection or co-infection with other pathogens not detected by the test. Clinical correlation with patient history and other diagnostic information is necessary to determine patient infection status. The expected result is Negative.  Fact Sheet for Patients:  PinkCheek.be  Fact Sheet for Healthcare Providers: GravelBags.it  This test is not yet approved or cleared by the [place] FDA and  has been authorized for detection and/or diagnosis of SARS-CoV-2 by FDA under an Emergency Use Authorization (EUA).  This EUA will remain in effect (meaning this test can be  used) for the duration of  the COVID-19 declaration under Section 564(b)(1) of the Act, 21 U.S.C. section 360bbb-3(b)(1), unless the authorization is terminated or revoked sooner.      Influenza A by PCR NEGATIVE NEGATIVE Final   Influenza B by PCR NEGATIVE NEGATIVE Final    Comment: (NOTE) The Xpert Xpress  SARS-CoV-2/FLU/RSV assay is intended as an aid in  the diagnosis of influenza from Nasopharyngeal swab specimens and  should not be used as a sole basis for treatment. Nasal washings and  aspirates are unacceptable for Xpert Xpress SARS-CoV-2/FLU/RSV  testing.  Fact Sheet for Patients: PinkCheek.be  Fact Sheet for Healthcare Providers: GravelBags.it  This test is not yet approved or cleared by the [place] FDA and  has been authorized for detection and/or diagnosis of SARS-CoV-2 by  FDA under an Emergency Use Authorization (EUA). This EUA will remain  in effect (meaning this test can be used) for the duration of the  Covid-19 declaration under Section 564(b)(1) of the Act, 21  U.S.C. section 360bbb-3(b)(1), unless the authorization is  terminated or revoked. Performed at [facility], [place] [address], [place], [place] [address]     Radiology Reports DG Chest [place] 1 View  Result Date: 08/14/2020 CLINICAL DATA:  Shortness of breath.  COVID positive. EXAM: PORTABLE CHEST 1 VIEW COMPARISON:  06/05/2016 FINDINGS: Lung volumes are low. Patchy heterogeneous bilateral airspace opacities in a mid-lower lung zone predominant distribution. Normal  heart size for technique. No evidence of pneumomediastinum. No significant pleural effusion. No pneumothorax. No acute osseous abnormalities are seen. Overlying oxygen tubing and EKG leads. IMPRESSION: Patchy heterogeneous bilateral airspace opacities consistent with COVID-19 pneumonia. Electronically Signed   By: Keith Rake M.D.   On: 08/14/2020 22:27

## 2020-08-15 NOTE — ED Notes (Signed)
Updated sister Pam with pt's permission.

## 2020-08-15 NOTE — H&P (Signed)
History and Physical    Joshua Bean ZOX:[id number] DOB: Apr 10, 1976 DOA: 08/14/2020  PCP: Jearld Fenton, NP Patient coming from: Home  Chief Complaint: Shortness of breath  HPI: Joshua Bean is a 44 y.o. male with medical history significant of anxiety, hyperlipidemia, type 2 diabetes not on any medications presenting with a chief complaint of shortness of breath.  He did have a known Covid exposure.  Satting 84% on room air with EMS, improved to 90% with 6 L supplemental oxygen.  Tachycardic with heart rate in the 110s.  Tachypneic with respiratory rate in the 20s.  Patient states he has not been vaccinated against Covid.  He was exposed to a friend who tested positive for Covid.  He has been feeling sick for over a week.  Symptoms include fevers, headaches, nausea, and poor oral intake.  For the past 2 days he is having cough and shortness of breath.  Denies chest pain, abdominal pain, or diarrhea.  States he was diagnosed with diabetes 2 years ago but has never been on any medications.  His disease has been managed with lifestyle interventions such as diet, exercise, and weight loss.   ED Course: Febrile with temperature 102.9 F.  Tachycardic with heart rate in the 110s.  Tachypneic with respiratory rate in the 30s.  Tachypneic oxygen saturation dropped to 84% on 6 L supplemental oxygen, required 10-15 L via nonrebreather to maintain his sats above 90%.  SARS-CoV-2 PCR test positive.  Influenza panel negative.  WBC 4.1, hemoglobin 15.9, hematocrit 44.4, platelet 163K.  Sodium 129, potassium 3.7, chloride 96, bicarb 16, BUN 15, creatinine 1.0, glucose 293, anion gap 17.  AST 53, ALT 41, alk phos 63, T bili 2.0.  Procalcitonin 0.51.  Inflammatory markers elevated: D-dimer 2.35, LDH 502, ferritin 3479, fibrinogen >800, triglycerides 275, CRP 20.5.  Lactic acid 2.2.  Blood culture x2 pending.  Chest x-ray showing findings consistent with multifocal pneumonia.  Patient was given Tylenol, Tussionex,  IV Decadron 10 mg, ibuprofen, NovoLog 8 units, and remdesivir.  Review of Systems:  All systems reviewed and apart from history of presenting illness, are negative.  Past Medical History:  Diagnosis Date  . Anxiety   . Chicken pox   . Skin cancer of face     Past Surgical History:  Procedure Laterality Date  . BACK SURGERY    . Neck surgery       reports that he has never smoked. His smokeless tobacco use includes chew. He reports previous alcohol use. He reports that he does not use drugs.  Allergies  Allergen Reactions  . Bee Venom Anaphylaxis    Family History  Problem Relation Age of Onset  . Heart disease Mother   . Diabetes Mother   . Parkinson's disease Mother   . Heart disease Father   . Diabetes Father   . Cancer Neg Hx   . Stroke Neg Hx     Prior to Admission medications   Medication Sig Start Date End Date Taking? Authorizing Provider  ALPRAZolam Duanne Moron) 0.5 MG tablet Take 1 tablet (0.5 mg total) by mouth at bedtime as needed for anxiety. 06/04/20   Jearld Fenton, NP  citalopram (CELEXA) 10 MG tablet Take 1 tablet (10 mg total) by mouth daily. 06/04/20   Jearld Fenton, NP  meloxicam (MOBIC) 7.5 MG tablet Take 1 tablet (7.5 mg total) by mouth daily. 06/04/20   Jearld Fenton, NP  Multiple Vitamin (MULTIVITAMIN) tablet Take 1 tablet by mouth  daily.    [provider]  sildenafil (VIAGRA) 25 MG tablet Take 1 tablet (25 mg total) by mouth daily as needed for erectile dysfunction. 06/04/20   Jearld Fenton, NP  simvastatin (ZOCOR) 20 MG tablet Take 1 tablet (20 mg total) by mouth at bedtime. Patient not taking: Reported on 06/04/2020 04/04/16   Jearld Fenton, NP    Physical Exam: Vitals:   08/15/20 0215 08/15/20 0230 08/15/20 0245 08/15/20 0300  BP: 104/76 101/74 109/82 107/74  Pulse: 94 97 98 93  Resp: (!) 24 (!) 22 17 (!) 21  Temp:      TempSrc:      SpO2: (!) 89% (!) 88% 92% 90%  Weight:      Height:        Physical Exam Constitutional:       Appearance: He is ill-appearing and diaphoretic.  HENT:     Head: Normocephalic and atraumatic.     Mouth/Throat:     Mouth: Mucous membranes are dry.  Eyes:     Extraocular Movements: Extraocular movements intact.     Conjunctiva/sclera: Conjunctivae normal.  Cardiovascular:     Rate and Rhythm: Normal rate and regular rhythm.     Pulses: Normal pulses.  Pulmonary:     Effort: Pulmonary effort is normal.     Breath sounds: Rales present. No wheezing.     Comments: Satting around 93% on 5 L supplemental oxygen No increased work of breathing Abdominal:     General: Bowel sounds are normal. There is no distension.     Palpations: Abdomen is soft.     Tenderness: There is no abdominal tenderness.  Musculoskeletal:        General: No swelling or tenderness.     Cervical back: Normal range of motion and neck supple.  Skin:    General: Skin is warm.  Neurological:     General: No focal deficit present.     Mental Status: He is alert and oriented to person, place, and time.     Labs on Admission: I have personally reviewed following labs and imaging studies  CBC: Recent Labs  Lab 08/14/20 2157  WBC 4.1  NEUTROABS 3.2  HGB 15.9  HCT 44.4  MCV 89.3  PLT 741   Basic Metabolic Panel: Recent Labs  Lab 08/14/20 2157  NA 129*  K 3.7  CL 96*  CO2 16*  GLUCOSE 293*  BUN 15  CREATININE 1.07  CALCIUM 8.5*   GFR: Estimated Creatinine Clearance: 104.9 mL/min (by C-G formula based on SCr of 1.07 mg/dL). Liver Function Tests: Recent Labs  Lab 08/14/20 2157  AST 53*  ALT 41  ALKPHOS 63  BILITOT 2.0*  PROT 6.6  ALBUMIN 2.9*   No results for input(s): LIPASE, AMYLASE in the last 168 hours. No results for input(s): AMMONIA in the last 168 hours. Coagulation Profile: No results for input(s): INR, PROTIME in the last 168 hours. Cardiac Enzymes: No results for input(s): CKTOTAL, CKMB, CKMBINDEX, TROPONINI in the last 168 hours. BNP (last 3 results) No results for  input(s): PROBNP in the last 8760 hours. HbA1C: No results for input(s): HGBA1C in the last 72 hours. CBG: No results for input(s): GLUCAP in the last 168 hours. Lipid Profile: Recent Labs    08/14/20 2157  TRIG 275*   Thyroid Function Tests: No results for input(s): TSH, T4TOTAL, FREET4, T3FREE, THYROIDAB in the last 72 hours. Anemia Panel: Recent Labs    08/14/20 2157  FERRITIN 3,479*  Urine analysis: No results found for: COLORURINE, APPEARANCEUR, LABSPEC, [place], GLUCOSEU, HGBUR, BILIRUBINUR, [place], PROTEINUR, UROBILINOGEN, NITRITE, LEUKOCYTESUR  Radiological Exams on Admission: DG Chest Port 1 View  Result Date: 08/14/2020 CLINICAL DATA:  Shortness of breath.  COVID positive. EXAM: PORTABLE CHEST 1 VIEW COMPARISON:  06/05/2016 FINDINGS: Lung volumes are low. Patchy heterogeneous bilateral airspace opacities in a mid-lower lung zone predominant distribution. Normal heart size for technique. No evidence of pneumomediastinum. No significant pleural effusion. No pneumothorax. No acute osseous abnormalities are seen. Overlying oxygen tubing and EKG leads. IMPRESSION: Patchy heterogeneous bilateral airspace opacities consistent with COVID-19 pneumonia. Electronically Signed   By: Keith Rake M.D.   On: 08/14/2020 22:27    EKG: Independently reviewed.  Sinus tachycardia, baseline wander in inferior leads.  Assessment/Plan Principal Problem:   Pneumonia due to COVID-19 virus Active Problems:   Diabetes mellitus ([place])   Anxiety and depression   Sepsis ([place])   Acute respiratory failure with hypoxia ([facility])   Sepsis and acute hypoxic respiratory failure secondary to COVID-19 viral pneumonia: Febrile, tachycardic, and tachypneic on arrival.  Hypoxic to the 80s on room air, currently satting around 93% on 5 L supplemental oxygen. SARS-CoV-2 PCR test positive.  Chest x-ray with findings consistent with multifocal pneumonia.  Inflammatory markers elevated: D-dimer 2.35, LDH 502,  ferritin 3479, fibrinogen >800, triglycerides 275, CRP 20.5.  Lactic acid 2.2. -Remdesivir -IV Solu-Medrol 0.5 mg/kg every 12 hours -Discussed FDA's emergency use authorization of baricitinib for the treatment of Covid infection in hospitalized patients requiring supplemental oxygen.  Patient denies history of hepatitis, HIV, malignancy, or active TB.  Risks versus benefits discussed with the patient and he wants to proceed with using baricitinib to treat his illness.  Start baricitinib 4 mg daily. -Does not have leukocytosis on labs.  However, procalcitonin elevated at 0.51.  Will start ceftriaxone and azithromycin at this time.  Also check UA to rule out other potential source of infection.  Trend procalcitonin. -Vitamin C, zinc, vitamin D -Antitussives as needed -Tylenol as needed -Bronchodilator as needed -Daily CBC with differential, CMP, CRP, D-dimer -Airborne and contact precautions -Incentive spirometry, flutter valve -Encourage prone positioning -Continuous pulse ox -Supplemental oxygen as needed to keep oxygen saturation above 90% -Blood culture x2 pending  Poorly controlled type 2 diabetes: Not on any medications.  Last A1c 11.1 in July 2021.  Blood glucose 293.  Bicarb 16, anion gap 17.  ?DKA. Lactic acidosis and hypoxia also likely contributing to metabolic acidosis.  Patient received NovoLog 8 units in the ED. -Repeat A1c.  Check beta hydroxybutyric acid level.  Sliding scale insulin resistant ACHS as he will be receiving high-dose steroid for Covid pneumonia.  Give 1 L fluid bolus as he appears dehydrated.  Repeat BMP.  Mild LFT elevation: Likely due to acute viral illness. -Continue to monitor LFTs  Anxiety and depression -Resume home medications after pharmacy med rec is done  Hyperlipidemia -Resume home statin after pharmacy med rec is done  DVT prophylaxis: Lovenox Code Status: Full code Family Communication: No family available this time. Disposition Plan: Status  is: Inpatient  Remains inpatient appropriate because:IV treatments appropriate due to intensity of illness or inability to take PO, Inpatient level of care appropriate due to severity of illness and New supplemental oxygen requirement   Dispo: The patient is from: Home              Anticipated d/c is to: Home  Anticipated d/c date is: > 3 days              Patient currently is not medically stable to d/c.  The medical decision making on this patient was of high complexity and the patient is at high risk for clinical deterioration, therefore this is a level 3 visit.  Shela Leff MD [facility]  If 7PM-7AM, please contact night-coverage [email or web address]  08/15/2020, 3:15 AM

## 2020-08-15 NOTE — ED Notes (Signed)
Pt sats dropping to 87% on 6L.  Pt becoming more anxious. Placed in prone position.  Will place in chair if pt cannot tolerate per MD.

## 2020-08-15 NOTE — Progress Notes (Signed)
Inpatient Diabetes Program Recommendations  AACE/ADA: New Consensus Statement on Inpatient Glycemic Control   Target Ranges:  Prepandial:   less than 140 mg/dL      Peak postprandial:   less than 180 mg/dL (1-2 hours)      Critically ill patients:  140 - 180 mg/dL   Results for Joshua Bean, Joshua Bean (MRN [id number]) as of 08/15/2020 13:22  Ref. Range 08/15/2020 08:52  Glucose-Capillary Latest Ref Range: 70 - 99 mg/dL 393 (H)   Results for Joshua Bean, Joshua Bean (MRN [id number]) as of 08/15/2020 13:22  Ref. Range 08/14/2020 21:57 08/15/2020 02:26  Glucose Latest Ref Range: 70 - 99 mg/dL 293 (H) 361 (H)   Results for Joshua Bean, Joshua Bean (MRN [id number]) as of 08/15/2020 13:22  Ref. Range 06/06/2016 00:44 06/04/2020 12:52 08/15/2020 03:15  Hemoglobin A1C Latest Ref Range: 4.8 - 5.6 % 7.0 (H) 11.1 (H) 10.8 (H)   Review of Glycemic Control  Diabetes history: DM2 Outpatient Diabetes medications: None Current orders for Inpatient glycemic control: Lantus 30 units daily, Novolog 4 units TID with meals, Novolog 0-20 units TID with meals, Novolog 0-5 units QHS; Solumedrol 40 mg Q12H  Inpatient Diabetes Program Recommendations:    HbgA1C:  A1C 10.8% on 08/15/20 indicating an average glucose of 263 mg/dl over the past 2-3 months.  Note: Noted consult for Diabetes Coordinator. Diabetes Coordinator is not on campus over the weekend but available by pager from 8am to 5pm for questions or concerns. Chart reviewed. Per chart, patient has a history of DM2 and does not take any DM medications outpatient. Noted patient seen R. Garnette Gunner, NP on 06/04/20 and was noted to have DM, was not prescribed any DM medications. A1C was 11.1% on 06/04/20 but not referenced in office note by NP on 06/04/20. Patient is currently admitted with COVID, ordered steroids, and currently still in the Emergency Room. Agree with current orders for inpatient glycemic control.  Will order Living well with DM and insulin starter kit.  Will order patient education by  bedside RNs to educate on insulin and will ask that patient to be allowed to self administer insulin injections while inpatient.  Diabetes Coordinator will follow up with patient on Monday.   Thanks, Barnie Alderman, RN, MSN, CDE Diabetes Coordinator Inpatient Diabetes Program [phone] (Team Pager from 8am to 5pm)

## 2020-08-15 NOTE — ED Notes (Signed)
SDU Breakfast Ordered 

## 2020-08-16 ENCOUNTER — Inpatient Hospital Stay (HOSPITAL_COMMUNITY): Payer: 59

## 2020-08-16 DIAGNOSIS — R7989 Other specified abnormal findings of blood chemistry: Secondary | ICD-10-CM

## 2020-08-16 DIAGNOSIS — U071 COVID-19: Secondary | ICD-10-CM

## 2020-08-16 DIAGNOSIS — J1282 Pneumonia due to Coronavirus disease 2019: Secondary | ICD-10-CM | POA: Diagnosis not present

## 2020-08-16 LAB — CBC WITH DIFFERENTIAL/PLATELET
Abs Immature Granulocytes: 0 10*3/uL (ref 0.00–0.07)
Basophils Absolute: 0 10*3/uL (ref 0.0–0.1)
Basophils Relative: 0 %
Eosinophils Absolute: 0 10*3/uL (ref 0.0–0.5)
Eosinophils Relative: 0 %
HCT: 40.6 % (ref 39.0–52.0)
Hemoglobin: 14 g/dL (ref 13.0–17.0)
Lymphocytes Relative: 6 %
Lymphs Abs: 0.5 10*3/uL — ABNORMAL LOW (ref 0.7–4.0)
MCH: 30.8 pg (ref 26.0–34.0)
MCHC: 34.5 g/dL (ref 30.0–36.0)
MCV: 89.2 fL (ref 80.0–100.0)
Monocytes Absolute: 0.7 10*3/uL (ref 0.1–1.0)
Monocytes Relative: 9 %
Neutro Abs: 6.6 10*3/uL (ref 1.7–7.7)
Neutrophils Relative %: 85 %
Platelets: 197 10*3/uL (ref 150–400)
RBC: 4.55 MIL/uL (ref 4.22–5.81)
RDW: 12.8 % (ref 11.5–15.5)
WBC: 7.8 10*3/uL (ref 4.0–10.5)
nRBC: 0.5 % — ABNORMAL HIGH (ref 0.0–0.2)
nRBC: 2 /100 WBC — ABNORMAL HIGH

## 2020-08-16 LAB — COMPREHENSIVE METABOLIC PANEL
ALT: 31 U/L (ref 0–44)
AST: 32 U/L (ref 15–41)
Albumin: 2.2 g/dL — ABNORMAL LOW (ref 3.5–5.0)
Alkaline Phosphatase: 57 U/L (ref 38–126)
Anion gap: 12 (ref 5–15)
BUN: 25 mg/dL — ABNORMAL HIGH (ref 6–20)
CO2: 18 mmol/L — ABNORMAL LOW (ref 22–32)
Calcium: 8.6 mg/dL — ABNORMAL LOW (ref 8.9–10.3)
Chloride: 105 mmol/L (ref 98–111)
Creatinine, Ser: 0.79 mg/dL (ref 0.61–1.24)
GFR calc Af Amer: >60 mL/min (ref >60)
GFR calc non Af Amer: >60 mL/min (ref >60)
Glucose, Bld: 284 mg/dL — ABNORMAL HIGH (ref 70–99)
Potassium: 4 mmol/L (ref 3.5–5.1)
Sodium: 135 mmol/L (ref 135–145)
Total Bilirubin: 0.9 mg/dL (ref 0.3–1.2)
Total Protein: 5.6 g/dL — ABNORMAL LOW (ref 6.5–8.1)

## 2020-08-16 LAB — PROCALCITONIN: Procalcitonin: 0.54 ng/mL

## 2020-08-16 LAB — BRAIN NATRIURETIC PEPTIDE: B Natriuretic Peptide: 52.6 pg/mL (ref 0.0–100.0)

## 2020-08-16 LAB — GLUCOSE, CAPILLARY
Glucose-Capillary: 275 mg/dL — ABNORMAL HIGH (ref 70–99)
Glucose-Capillary: 261 mg/dL — ABNORMAL HIGH (ref 70–99)
Glucose-Capillary: 242 mg/dL — ABNORMAL HIGH (ref 70–99)
Glucose-Capillary: 192 mg/dL — ABNORMAL HIGH (ref 70–99)

## 2020-08-16 LAB — MAGNESIUM: Magnesium: 2.2 mg/dL (ref 1.7–2.4)

## 2020-08-16 LAB — C-REACTIVE PROTEIN: CRP: 7.8 mg/dL — ABNORMAL HIGH (ref <1.0)

## 2020-08-16 LAB — D-DIMER, QUANTITATIVE: D-Dimer, Quant: 0.84 ug/mL-FEU — ABNORMAL HIGH (ref 0.00–0.50)

## 2020-08-16 MED ORDER — MAGNESIUM HYDROXIDE 400 MG/5ML PO SUSP
30.0000 mL | Freq: Two times a day (BID) | ORAL | Status: AC
  Administered 2020-08-16 (×2): 30 mL via ORAL
  Filled 2020-08-16 (×2): qty 30

## 2020-08-16 MED ORDER — LACTATED RINGERS IV BOLUS
1000.0000 mL | Freq: Once | INTRAVENOUS | Status: AC
  Administered 2020-08-16: 1000 mL via INTRAVENOUS

## 2020-08-16 MED ORDER — POLYETHYLENE GLYCOL 3350 17 G PO PACK
17.0000 g | PACK | Freq: Two times a day (BID) | ORAL | Status: DC
  Administered 2020-08-16 – 2020-08-29 (×6): 17 g via ORAL
  Filled 2020-08-16 (×18): qty 1

## 2020-08-16 MED ORDER — INSULIN ASPART 100 UNIT/ML ~~LOC~~ SOLN
6.0000 [IU] | Freq: Three times a day (TID) | SUBCUTANEOUS | Status: DC
  Administered 2020-08-16 (×3): 6 [IU] via SUBCUTANEOUS

## 2020-08-16 NOTE — Progress Notes (Addendum)
O2 sats on 6 liters 81-85% Enc. I.S. and Flutter valve. Enc. patient to breath through nose and change pulse ox probe. See vital sign flow sheet . Gave him his schedule dose Sol-u-medrol and enc. Prone position O2 sats up to 90% once in prone position. Resp. There. called and made aware and Tim. R.N. aware.resp . Up to 26 -30

## 2020-08-16 NOTE — Progress Notes (Signed)
VASCULAR LAB    Bilateral lower extremity venous duplex has been performed.  See CV proc for preliminary results.   Kalii Chesmore, RVT 08/16/2020, 11:11 AM

## 2020-08-16 NOTE — Progress Notes (Signed)
Resp. Therapy here to assess patient and change patient to HFNC 10 liters. And change o2 sat probe to ear. Enc. Patient to cont.prone position sats up 93%

## 2020-08-16 NOTE — Progress Notes (Signed)
Pt complained having persistent dry cough associated with SOB and dyspnea at rest, RR 40-50, SPO2 81-85%, BP stable, HR 90-105.  He's on 10 LPM of HFNCL. Auscultated fine crackles and coarse crackles presented bilaterally.   Notified Dr. Tonie Griffith, on-call provider. Order received for breathing treatment and Tussionex stat.   Notified Kim, RT, recommended nonrebreathing mask with flow rated  15 LMP.   Pt's more stabilized after interventions.Requested Xanax at bedtime for his anxiety. SPO2 93%, RR 26-35.HR 87,BP stable. We will continue to monitor.  Kennyth Lose, RN

## 2020-08-16 NOTE — Progress Notes (Signed)
PT Cancellation Note  Patient Details Name: Joshua Bean MRN: [id number] DOB: 19-Jan-1976   Cancelled Treatment:    Reason Eval/Treat Not Completed: Patient declined, no reason specified.  Pt stated he had just got back in bed from the chair  (eating lunch) due to having a panic attack.  No able to try working with therapy at this time. 08/16/2020  Ginger Carne., PT Acute Rehabilitation Services [phone]  (pager) [phone]  (office)   Tessie Fass Avyon Herendeen 08/16/2020, 2:26 PM

## 2020-08-16 NOTE — Progress Notes (Signed)
Inpatient Diabetes Program Recommendations  AACE/ADA: New Consensus Statement on Inpatient Glycemic Control   Target Ranges:  Prepandial:   less than 140 mg/dL      Peak postprandial:   less than 180 mg/dL (1-2 hours)      Critically ill patients:  140 - 180 mg/dL   Results for CONNELLY, NETTERVILLE (MRN [id number]) as of 08/16/2020 10:27  Ref. Range 08/15/2020 08:52 08/15/2020 13:51 08/15/2020 17:48 08/15/2020 22:12 08/16/2020 06:12  Glucose-Capillary Latest Ref Range: 70 - 99 mg/dL 393 (H) 361 (H) 368 (H) 279 (H) 275 (H)   Review of Glycemic Control  Diabetes history: DM2 Outpatient Diabetes medications: None Current orders for Inpatient glycemic control: Lantus 40 units daily, Novolog 6 units TID with meals, Novolog 0-20 units TID with meals, Novolog 0-5 units QHS; Solumedrol 40 mg Q12H  Inpatient Diabetes Program Recommendations:    Insulin: If steroids are continued as ordered, please consider increasing Lantus to 50 units daily and meal coverage to Novolog 12 units TID with meals.  HbgA1C:  A1C 10.8% on 08/15/20 indicating an average glucose of 263 mg/dl over the past 2-3 months.  Thanks, Barnie Alderman, RN, MSN, CDE Diabetes Coordinator Inpatient Diabetes Program [phone] (Team Pager from 8am to 5pm)

## 2020-08-16 NOTE — Plan of Care (Signed)
Progressing in all areas, will continue to monitor.

## 2020-08-16 NOTE — Progress Notes (Signed)
PROGRESS NOTE                                                                                                                                                                                                             Patient Demographics:    Joshua Bean, is a 44 y.o. male, DOB - [birth year], CBU:[id number]  Outpatient Primary MD for the patient is Jearld Fenton, NP    LOS - 1  Admit date - 08/14/2020    Chief Complaint  Patient presents with  . Shortness of Breath       Brief Narrative - Joshua Bean is a 44 y.o. male with medical history significant of anxiety, hyperlipidemia, type 2 diabetes not on any medications presenting with a chief complaint of shortness of breath.  He was diagnosed with acute hypoxic respiratory failure due to DKA along with poorly controlled blood sugars likely early DKA and he was admitted to the hospital.   Subjective:   Patient in bed, appears comfortable, denies any headache, no fever, no chest pain or pressure, improved shortness of breath , no abdominal pain. No focal weakness.   Assessment  & Plan :     1. Acute Hypoxic Resp. Failure due to Acute Covid 19 Viral Pneumonitis during the ongoing 2020 Covid 19 Pandemic - he is unfortunately unvaccinated and he has incurred moderate to severe parenchymal lung injury, has been started on combination of IV steroids, baricitinib and steroids.  Continue to monitor.  Overall tenuous.  Encouraged the patient to sit up in chair in the daytime use I-S and flutter valve for pulmonary toiletry and then prone in bed when at night.  Will advance activity and titrate down oxygen as possible.   SpO2: 92 % O2 Flow Rate (L/min): 10 L/min  Recent Labs  Lab 08/14/20 2157 08/14/20 2207 08/14/20 2227 08/15/20 0315 08/16/20 0619  WBC 4.1  --   --   --  7.8  PLT 163  --   --   --  197  CRP 20.5*  --   --   --  7.8*  BNP  --   --   --  37.1 52.6    DDIMER 2.35*  --   --   --  0.84*  PROCALCITON 0.51  --   --  0.62 0.54  AST 53*  --   --   --  32  ALT 41  --   --   --  31  ALKPHOS 63  --   --   --  57  BILITOT 2.0*  --   --   --  0.9  ALBUMIN 2.9*  --   --   --  2.2*  LATICACIDVEN  --   --  2.2* 1.5  --   SARSCOV2NAA  --  POSITIVE*  --   --   --     2.  Asymptomatic viral infection induced transaminitis.  Trend.  3.  Dyslipidemia.  He is on statin.  4.  Anxiety and depression.  On PRN  home medications.  5.  DM type II.  In poor control due to hyperglycemia, was on no medications at home.  Placed on Lantus, Premeal NovoLog along with sliding scale.  Monitor and adjust.  DM and insulin education provided.  Lab Results  Component Value Date   HGBA1C 10.8 (H) 08/15/2020   CBG (last 3)  Recent Labs    08/15/20 1748 08/15/20 2212 08/16/20 0612  GLUCAP 368* 279* 275*       Condition -  Guarded  Family Communication  :   Tyler Aas [phone] on 08/16/2020   number listed for his father is out of service.   Code Status :  Full  Consults  :  None  Procedures  :  None  PUD Prophylaxis : None  Disposition Plan  :    Status is: Inpatient  Remains inpatient appropriate because:IV treatments appropriate due to intensity of illness or inability to take PO   Dispo: The patient is from: Home              Anticipated d/c is to: Home              Anticipated d/c date is: > 3 days              Patient currently is not medically stable to d/c.   DVT Prophylaxis  :  Lovenox   Lab Results  Component Value Date   PLT 197 08/16/2020    Diet :  Diet Order            Diet heart healthy/carb modified Room service appropriate? Yes; Fluid consistency: Thin  Diet effective now                  Inpatient Medications  Scheduled Meds: . vitamin C  500 mg Oral Daily  . aspirin  81 mg Oral Daily  . atorvastatin  40 mg Oral Daily  . baricitinib  4 mg Oral Daily  . cholecalciferol  1,000 Units Oral Daily   . enoxaparin (LOVENOX) injection  40 mg Subcutaneous Q0600  . insulin aspart  0-20 Units Subcutaneous TID WC  . insulin aspart  0-5 Units Subcutaneous QHS  . insulin aspart  6 Units Subcutaneous TID WC  . insulin glargine  40 Units Subcutaneous Daily  . insulin starter kit- pen needles  1 kit Other Once  . levofloxacin  500 mg Oral Daily  . living well with diabetes book   Does not apply Once  . methylPREDNISolone (SOLU-MEDROL) injection  40 mg Intravenous Q12H  . multivitamin with minerals  1 tablet Oral Daily  . zinc sulfate  220 mg Oral Daily   Continuous Infusions: . remdesivir 100 mg in NS 100 mL 100 mg (08/16/20 0900)   PRN Meds:.acetaminophen, albuterol, ALPRAZolam, chlorpheniramine-HYDROcodone, guaiFENesin-dextromethorphan  Antibiotics  :    Anti-infectives (  From admission, onward)   Start     Dose/Rate Route Frequency Ordered Stop   08/16/20 1000  remdesivir 100 mg in sodium chloride 0.9 % 100 mL IVPB       "Followed by" Linked Group Details   100 mg 200 mL/hr over 30 Minutes Intravenous Daily 08/14/20 2355 08/20/20 0959   08/16/20 1000  levofloxacin (LEVAQUIN) tablet 500 mg        500 mg Oral Daily 08/15/20 0720 08/21/20 0959   08/15/20 0245  cefTRIAXone (ROCEPHIN) 1 g in sodium chloride 0.9 % 100 mL IVPB  Status:  Discontinued        1 g 200 mL/hr over 30 Minutes Intravenous Daily 08/15/20 0233 08/15/20 0720   08/15/20 0245  azithromycin (ZITHROMAX) 500 mg in sodium chloride 0.9 % 250 mL IVPB  Status:  Discontinued        500 mg 250 mL/hr over 60 Minutes Intravenous Daily 08/15/20 0233 08/15/20 0720   08/15/20 0100  remdesivir 200 mg in sodium chloride 0.9% 250 mL IVPB       "Followed by" Linked Group Details   200 mg 580 mL/hr over 30 Minutes Intravenous Once 08/14/20 2355 08/15/20 0232       Time Spent in minutes  30   Lala Lund M.D on 08/16/2020 at 10:49 AM  To page go to [email or web address] - password [facility]  [facility] -  Office  [phone]     See all Orders from today for further details    Objective:   Vitals:   08/16/20 0309 08/16/20 0421 08/16/20 0622 08/16/20 0803  BP: (!) 100/59  99/67 106/69  Pulse: 87  93 95  Resp: (!) 29  (!) 32 (!) 32  Temp: 97.7 F (36.5 C)  97.8 F (36.6 C) 98.4 F (36.9 C)  TempSrc: Oral  Oral Oral  SpO2: (!) 88% 93% 94% 92%  Weight:      Height:        Wt Readings from Last 3 Encounters:  08/14/20 95.3 kg  06/04/20 (!) 95.3 kg  11/15/16 105.2 kg     Intake/Output Summary (Last 24 hours) at 08/16/2020 1049 Last data filed at 08/16/2020 0700 Gross per 24 hour  Intake 1440 ml  Output 2200 ml  Net -760 ml     Physical Exam  Awake Alert, No new F.N deficits, Normal affect [place].AT,PERRAL Supple Neck,No JVD, No cervical lymphadenopathy appriciated.  Symmetrical Chest wall movement, Good air movement bilaterally, CTAB RRR,No Gallops, Rubs or new Murmurs, No Parasternal Heave +ve B.Sounds, Abd Soft, No tenderness, No organomegaly appriciated, No rebound - guarding or rigidity. No Cyanosis, Clubbing or edema, No new Rash or bruise    Data Review:    CBC Recent Labs  Lab 08/14/20 2157 08/16/20 0619  WBC 4.1 7.8  HGB 15.9 14.0  HCT 44.4 40.6  PLT 163 197  MCV 89.3 89.2  MCH 32.0 30.8  MCHC 35.8 34.5  RDW 12.5 12.8  LYMPHSABS 0.6* 0.5*  MONOABS 0.3 0.7  EOSABS 0.0 0.0  BASOSABS 0.0 0.0    Recent Labs  Lab 08/14/20 2157 08/14/20 2227 08/15/20 0226 08/15/20 0315 08/15/20 1326 08/15/20 1808 08/16/20 0619  NA 129*  --  132*  --  134* 133* 135  K 3.7  --  4.1  --  3.7 3.4* 4.0  CL 96*  --  99  --  102 103 105  CO2 16*  --  13*  --  15* 18* 18*  GLUCOSE 293*  --  361*  --  396* 384* 284*  BUN 15  --  23*  --  26* 25* 25*  CREATININE 1.07  --  1.25*  --  0.99 0.99 0.79  CALCIUM 8.5*  --  8.0*  --  8.6* 8.2* 8.6*  AST 53*  --   --   --   --   --  32  ALT 41  --   --   --   --   --  31  ALKPHOS 63  --   --   --   --   --  57  BILITOT 2.0*  --   --   --   --    --  0.9  ALBUMIN 2.9*  --   --   --   --   --  2.2*  MG  --   --   --  2.2  --   --  2.2  CRP 20.5*  --   --   --   --   --  7.8*  DDIMER 2.35*  --   --   --   --   --  0.84*  PROCALCITON 0.51  --   --  0.62  --   --  0.54  LATICACIDVEN  --  2.2*  --  1.5  --   --   --   HGBA1C  --   --   --  10.8*  --   --   --   BNP  --   --   --  37.1  --   --  52.6    ------------------------------------------------------------------------------------------------------------------ Recent Labs    08/14/20 2157  TRIG 275*    Lab Results  Component Value Date   HGBA1C 10.8 (H) 08/15/2020   ------------------------------------------------------------------------------------------------------------------ No results for input(s): TSH, T4TOTAL, T3FREE, THYROIDAB in the last 72 hours.  Invalid input(s): FREET3  Cardiac Enzymes No results for input(s): CKMB, TROPONINI, MYOGLOBIN in the last 168 hours.  Invalid input(s): CK ------------------------------------------------------------------------------------------------------------------    Component Value Date/Time   BNP 52.6 08/16/2020 1829    Micro Results Recent Results (from the past 240 hour(s))  Respiratory Panel by RT PCR (Flu A&B, Covid) - Nasopharyngeal Swab     Status: Abnormal   Collection Time: 08/14/20 10:07 PM   Specimen: Nasopharyngeal Swab  Result Value Ref Range Status   SARS Coronavirus 2 by RT PCR POSITIVE (A) NEGATIVE Final    Comment: RESULT CALLED TO, READ BACK BY AND VERIFIED WITH: Aileen Pilot MD 08/14/20 AT 2315 SK  (NOTE) SARS-CoV-2 target nucleic acids are DETECTED.  SARS-CoV-2 RNA is generally detectable in upper respiratory specimens  during the acute phase of infection. Positive results are indicative of the presence of the identified virus, but do not rule out bacterial infection or co-infection with other pathogens not detected by the test. Clinical correlation with patient history and other diagnostic  information is necessary to determine patient infection status. The expected result is Negative.  Fact Sheet for Patients:  PinkCheek.be  Fact Sheet for Healthcare Providers: GravelBags.it  This test is not yet approved or cleared by the [place] FDA and  has been authorized for detection and/or diagnosis of SARS-CoV-2 by FDA under an Emergency Use Authorization (EUA).  This EUA will remain in effect (meaning this test can be  used) for the duration of  the COVID-19 declaration under Section 564(b)(1) of the Act, 21 U.S.C. section 360bbb-3(b)(1), unless the authorization is terminated or revoked sooner.  Influenza A by PCR NEGATIVE NEGATIVE Final   Influenza B by PCR NEGATIVE NEGATIVE Final    Comment: (NOTE) The Xpert Xpress SARS-CoV-2/FLU/RSV assay is intended as an aid in  the diagnosis of influenza from Nasopharyngeal swab specimens and  should not be used as a sole basis for treatment. Nasal washings and  aspirates are unacceptable for Xpert Xpress SARS-CoV-2/FLU/RSV  testing.  Fact Sheet for Patients: PinkCheek.be  Fact Sheet for Healthcare Providers: GravelBags.it  This test is not yet approved or cleared by the [place] FDA and  has been authorized for detection and/or diagnosis of SARS-CoV-2 by  FDA under an Emergency Use Authorization (EUA). This EUA will remain  in effect (meaning this test can be used) for the duration of the  Covid-19 declaration under Section 564(b)(1) of the Act, 21  U.S.C. section 360bbb-3(b)(1), unless the authorization is  terminated or revoked. Performed at [facility], [place] [address], [place], [place] [address]   MRSA PCR Screening     Status: None   Collection Time: 08/15/20  2:28 PM   Specimen: Urine, Clean Catch; Nasopharyngeal  Result Value Ref Range Status   MRSA by PCR NEGATIVE NEGATIVE Final      Comment:        The GeneXpert MRSA Assay (FDA approved for NASAL specimens only), is one component of a comprehensive MRSA colonization surveillance program. It is not intended to diagnose MRSA infection nor to guide or monitor treatment for MRSA infections. Performed at [facility], [place] [address], [place], [place] [address]     Radiology Reports DG Chest [place] 1 View  Result Date: 08/14/2020 CLINICAL DATA:  Shortness of breath.  COVID positive. EXAM: PORTABLE CHEST 1 VIEW COMPARISON:  06/05/2016 FINDINGS: Lung volumes are low. Patchy heterogeneous bilateral airspace opacities in a mid-lower lung zone predominant distribution. Normal heart size for technique. No evidence of pneumomediastinum. No significant pleural effusion. No pneumothorax. No acute osseous abnormalities are seen. Overlying oxygen tubing and EKG leads. IMPRESSION: Patchy heterogeneous bilateral airspace opacities consistent with COVID-19 pneumonia. Electronically Signed   By: Keith Rake M.D.   On: 08/14/2020 22:27

## 2020-08-17 ENCOUNTER — Inpatient Hospital Stay (HOSPITAL_COMMUNITY): Payer: 59

## 2020-08-17 DIAGNOSIS — J1282 Pneumonia due to Coronavirus disease 2019: Secondary | ICD-10-CM | POA: Diagnosis not present

## 2020-08-17 DIAGNOSIS — U071 COVID-19: Secondary | ICD-10-CM | POA: Diagnosis not present

## 2020-08-17 LAB — CBC WITH DIFFERENTIAL/PLATELET
Abs Immature Granulocytes: 0.16 10*3/uL — ABNORMAL HIGH (ref 0.00–0.07)
Basophils Absolute: 0 10*3/uL (ref 0.0–0.1)
Basophils Relative: 0 %
Eosinophils Absolute: 0 10*3/uL (ref 0.0–0.5)
Eosinophils Relative: 0 %
HCT: 39.7 % (ref 39.0–52.0)
Hemoglobin: 13.9 g/dL (ref 13.0–17.0)
Immature Granulocytes: 1 %
Lymphocytes Relative: 7 %
Lymphs Abs: 0.8 10*3/uL (ref 0.7–4.0)
MCH: 31.6 pg (ref 26.0–34.0)
MCHC: 35 g/dL (ref 30.0–36.0)
MCV: 90.2 fL (ref 80.0–100.0)
Monocytes Absolute: 0.7 10*3/uL (ref 0.1–1.0)
Monocytes Relative: 6 %
Neutro Abs: 9.8 10*3/uL — ABNORMAL HIGH (ref 1.7–7.7)
Neutrophils Relative %: 86 %
Platelets: 229 10*3/uL (ref 150–400)
RBC: 4.4 MIL/uL (ref 4.22–5.81)
RDW: 12.7 % (ref 11.5–15.5)
WBC: 11.4 10*3/uL — ABNORMAL HIGH (ref 4.0–10.5)
nRBC: 0.3 % — ABNORMAL HIGH (ref 0.0–0.2)

## 2020-08-17 LAB — PROCALCITONIN: Procalcitonin: 0.32 ng/mL

## 2020-08-17 LAB — GLUCOSE, CAPILLARY
Glucose-Capillary: 224 mg/dL — ABNORMAL HIGH (ref 70–99)
Glucose-Capillary: 195 mg/dL — ABNORMAL HIGH (ref 70–99)
Glucose-Capillary: 209 mg/dL — ABNORMAL HIGH (ref 70–99)
Glucose-Capillary: 229 mg/dL — ABNORMAL HIGH (ref 70–99)

## 2020-08-17 LAB — MAGNESIUM: Magnesium: 2.2 mg/dL (ref 1.7–2.4)

## 2020-08-17 LAB — COMPREHENSIVE METABOLIC PANEL
ALT: 30 U/L (ref 0–44)
AST: 38 U/L (ref 15–41)
Albumin: 2.3 g/dL — ABNORMAL LOW (ref 3.5–5.0)
Alkaline Phosphatase: 56 U/L (ref 38–126)
Anion gap: 9 (ref 5–15)
BUN: 21 mg/dL — ABNORMAL HIGH (ref 6–20)
CO2: 22 mmol/L (ref 22–32)
Calcium: 8.8 mg/dL — ABNORMAL LOW (ref 8.9–10.3)
Chloride: 107 mmol/L (ref 98–111)
Creatinine, Ser: 0.67 mg/dL (ref 0.61–1.24)
GFR calc Af Amer: >60 mL/min (ref >60)
GFR calc non Af Amer: >60 mL/min (ref >60)
Glucose, Bld: 156 mg/dL — ABNORMAL HIGH (ref 70–99)
Potassium: 3.3 mmol/L — ABNORMAL LOW (ref 3.5–5.1)
Sodium: 138 mmol/L (ref 135–145)
Total Bilirubin: 0.7 mg/dL (ref 0.3–1.2)
Total Protein: 5.6 g/dL — ABNORMAL LOW (ref 6.5–8.1)

## 2020-08-17 LAB — C-REACTIVE PROTEIN: CRP: 2.5 mg/dL — ABNORMAL HIGH (ref <1.0)

## 2020-08-17 LAB — D-DIMER, QUANTITATIVE: D-Dimer, Quant: 0.71 ug/mL-FEU — ABNORMAL HIGH (ref 0.00–0.50)

## 2020-08-17 LAB — BRAIN NATRIURETIC PEPTIDE: B Natriuretic Peptide: 84 pg/mL (ref 0.0–100.0)

## 2020-08-17 MED ORDER — POTASSIUM CHLORIDE CRYS ER 20 MEQ PO TBCR
40.0000 meq | EXTENDED_RELEASE_TABLET | Freq: Once | ORAL | Status: AC
  Administered 2020-08-17: 40 meq via ORAL
  Filled 2020-08-17: qty 2

## 2020-08-17 MED ORDER — FUROSEMIDE 10 MG/ML IJ SOLN
40.0000 mg | Freq: Once | INTRAMUSCULAR | Status: AC
  Administered 2020-08-17: 40 mg via INTRAVENOUS
  Filled 2020-08-17: qty 4

## 2020-08-17 MED ORDER — INSULIN ASPART 100 UNIT/ML ~~LOC~~ SOLN
8.0000 [IU] | Freq: Three times a day (TID) | SUBCUTANEOUS | Status: DC
  Administered 2020-08-17 – 2020-08-20 (×10): 8 [IU] via SUBCUTANEOUS

## 2020-08-17 MED ORDER — POTASSIUM CHLORIDE CRYS ER 20 MEQ PO TBCR
20.0000 meq | EXTENDED_RELEASE_TABLET | Freq: Once | ORAL | Status: AC
  Administered 2020-08-17: 20 meq via ORAL
  Filled 2020-08-17: qty 1

## 2020-08-17 NOTE — Evaluation (Signed)
Physical Therapy Evaluation Patient Details Name: Joshua Bean MRN: [id number] DOB: 1976-01-07 Today's Date: 08/17/2020   History of Present Illness  Joshua Bean is a 44 y.o. male with medical history significant of anxiety, hyperlipidemia, type 2 diabetes not on any medications presenting with a chief complaint of shortness of breath.  He did have a known Covid exposure.  Satting 84% on room air with EMS, improved to 90% with 6 L supplemental oxygen.  Tachycardic with heart rate in the 110s.  Tachypneic with respiratory rate in the 20s.  Patient states he has not been vaccinated against Covid.  Clinical Impression  Patient received in bed, O2 sats at 99% on HFNC. He is agreeable to PT assessment. Patient reports he is feeling weak. Coughing some with mobility efforts. He is mod independent with bed mobility and transfers. Able to take a few steps at edge of bed. Saturations remained in 90%s. He performed LAQ at edge of bed, but declines further exercise. He will continue to benefit from skilled PT while here to improve functional mobility and activity tolerance.        Follow Up Recommendations Home health PT    Equipment Recommendations  None recommended by PT;Other (comment) (TBD, dont anticipate needing anything)    Recommendations for Other Services       Precautions / Restrictions Precautions Precautions: Fall Precaution Comments: mod fall Restrictions Weight Bearing Restrictions: No      Mobility  Bed Mobility Overal bed mobility: Modified Independent                Transfers Overall transfer level: Modified independent Equipment used: None                Ambulation/Gait Ambulation/Gait assistance: Modified independent (Device/Increase time) Gait Distance (Feet): 2 Feet Assistive device: None Gait Pattern/deviations: Step-to pattern Gait velocity: decr   General Gait Details: Patient able to take a couple of steps along edge of bed. limited by lines,  O2, weakness  Stairs            Wheelchair Mobility    Modified Rankin (Stroke Patients Only)       Balance Overall balance assessment: Needs assistance Sitting-balance support: Feet supported Sitting balance-Leahy Scale: Good     Standing balance support: Single extremity supported;During functional activity Standing balance-Leahy Scale: Fair Standing balance comment: patient slightly unsteady and fatigued with activity.                             Pertinent Vitals/Pain Pain Assessment: No/denies pain    Home Living Family/patient expects to be discharged to:: Private residence Living Arrangements: Alone Available Help at Discharge: Family;Friend(s);Available PRN/intermittently Type of Home: House Home Access: Stairs to enter   [employer] of Steps: 5 Home Layout: One level        Prior Function Level of Independence: Independent         Comments: working     Higher education careers adviser        Extremity/Trunk Assessment   Upper Extremity Assessment Upper Extremity Assessment: Overall WFL for tasks assessed    Lower Extremity Assessment Lower Extremity Assessment: Generalized weakness    Cervical / Trunk Assessment Cervical / Trunk Assessment: Normal  Communication   Communication: No difficulties  Cognition Arousal/Alertness: Awake/alert Behavior During Therapy: WFL for tasks assessed/performed Overall Cognitive Status: Within Functional Limits for tasks assessed  General Comments      Exercises Other Exercises Other Exercises: LAQ x 5 reps bilaterally   Assessment/Plan    PT Assessment Patient needs continued PT services  PT Problem List Decreased strength;Decreased mobility;Decreased activity tolerance;Decreased balance;Cardiopulmonary status limiting activity;Decreased safety awareness       PT Treatment Interventions Therapeutic activities;Gait  training;Therapeutic exercise;Stair training;Functional mobility training;Balance training;Patient/family education    PT Goals (Current goals can be found in the Care Plan section)  Acute Rehab PT Goals Patient Stated Goal: to get better PT Goal Formulation: With patient Time For Goal Achievement: 08/31/20 Potential to Achieve Goals: Good    Frequency Min 3X/week   Barriers to discharge Decreased caregiver support      Co-evaluation               AM-PAC PT "6 Clicks" Mobility  Outcome Measure Help needed turning from your back to your side while in a flat bed without using bedrails?: A Little Help needed moving from lying on your back to sitting on the side of a flat bed without using bedrails?: A Little Help needed moving to and from a bed to a chair (including a wheelchair)?: A Little Help needed standing up from a chair using your arms (e.g., wheelchair or bedside chair)?: A Little Help needed to walk in hospital room?: A Little Help needed climbing 3-5 steps with a railing? : A Lot 6 Click Score: 17    End of Session Equipment Utilized During Treatment: Oxygen Activity Tolerance: Patient limited by fatigue Patient left: in bed;with call bell/phone within reach Nurse Communication: Mobility status PT Visit Diagnosis: Unsteadiness on feet (R26.81);Muscle weakness (generalized) (M62.81);Difficulty in walking, not elsewhere classified (R26.2)    Time: 1610-9604 PT Time Calculation (min) (ACUTE ONLY): 15 min   Charges:   PT Evaluation $PT Eval Moderate Complexity: 1 Mod          Dajanay Northrup, PT, GCS 08/17/20,12:39 PM

## 2020-08-17 NOTE — Progress Notes (Signed)
Inpatient Diabetes Program Recommendations  AACE/ADA: New Consensus Statement on Inpatient Glycemic Control (2015)  Target Ranges:  Prepandial:   less than 140 mg/dL      Peak postprandial:   less than 180 mg/dL (1-2 hours)      Critically ill patients:  140 - 180 mg/dL   Lab Results  Component Value Date   GLUCAP 224 (H) 08/17/2020   HGBA1C 10.8 (H) 08/15/2020    Review of Glycemic Control Results for Joshua Bean, Joshua Bean (MRN [id number]) as of 08/17/2020 10:48  Ref. Range 08/16/2020 06:12 08/16/2020 12:15 08/16/2020 17:23 08/16/2020 21:59 08/17/2020 06:53  Glucose-Capillary Latest Ref Range: 70 - 99 mg/dL 275 (H) 261 (H) 242 (H) 192 (H) 224 (H)   Current orders for Inpatient glycemic control:  Lantus 40 units daily Novolog 0-20 units tid with meals Novolog 0-5 units qhs Solumedrol 40 mg q12h  Inpatient Diabetes Program Recommendations:     Please consider, Lantus 45 units daily Novolog 8 units tid with meals if eats at least 50%   Will continue to follow while inpatient.  Thank you, Reche Dixon, RN, BSN Diabetes Coordinator Inpatient Diabetes Program [phone] (team pager from 8a-5p)

## 2020-08-17 NOTE — Progress Notes (Signed)
RT NOTES:  Patient placed on heated high flow nasal cannula 45L/100% with NRB mask over top per Dr Candiss Norse. Sats now 90%. Will continue to monitor.

## 2020-08-17 NOTE — Progress Notes (Signed)
Pt took non- rebreathing mask off, on room air and walked to bedside commode 3-5 feet far from his bed. SPO2 dropped rapidly to 65-72%, continue to have SOB, RR 35-40. Staff nurse instructed to put non- rebreathing mask back on flow 15 LPM. SPO2 went up to 85-95%. Continue to monitor.  Kennyth Lose, RN

## 2020-08-17 NOTE — Progress Notes (Addendum)
PROGRESS NOTE                                                                                                                                                                                                             Patient Demographics:    Joshua Bean, is a 44 y.o. male, DOB - 1976-07-30, ZSW:[id number]  Outpatient Primary MD for the patient is Jearld Fenton, NP    LOS - 2  Admit date - 08/14/2020    Chief Complaint  Patient presents with  . Shortness of Breath       Brief Narrative - Joshua Bean is a 44 y.o. male with medical history significant of anxiety, hyperlipidemia, type 2 diabetes not on any medications presenting with a chief complaint of shortness of breath.  He was diagnosed with acute hypoxic respiratory failure due to DKA along with poorly controlled blood sugars likely early DKA and he was admitted to the hospital.   Subjective:   Patient in bed, appears comfortable, denies any headache, no fever, no chest pain or pressure, +ve shortness of breath , no abdominal pain. No focal weakness.    Assessment  & Plan :     1. Acute Hypoxic Resp. Failure due to Acute Covid 19 Viral Pneumonitis during the ongoing 2020 Covid 19 Pandemic - he is unfortunately unvaccinated and he has incurred severe parenchymal lung injury, has been started on combination of IV steroids, baricitinib and steroids.  Continue to monitor.  As he has few crackles trial of Lasix on 08/17/2020, overall tenuous.  Encouraged the patient to sit up in chair in the daytime use I-S and flutter valve for pulmonary toiletry and then prone in bed when at night.  Will advance activity and titrate down oxygen as possible.   SpO2: 90 % O2 Flow Rate (L/min): 45 L/min FiO2 (%): 100 %  Recent Labs  Lab 08/14/20 2157 08/14/20 2207 08/14/20 2227 08/15/20 0315 08/16/20 0619 08/17/20 0152  WBC 4.1  --   --   --  7.8 11.4*  PLT 163  --   --   --   197 229  CRP 20.5*  --   --   --  7.8* 2.5*  BNP  --   --   --  37.1 52.6 84.0  DDIMER 2.35*  --   --   --  0.84* 0.71*  PROCALCITON 0.51  --   --  0.62 0.54 0.32  AST 53*  --   --   --  32 38  ALT 41  --   --   --  31 30  ALKPHOS 63  --   --   --  57 56  BILITOT 2.0*  --   --   --  0.9 0.7  ALBUMIN 2.9*  --   --   --  2.2* 2.3*  LATICACIDVEN  --   --  2.2* 1.5  --   --   SARSCOV2NAA  --  POSITIVE*  --   --   --   --     2.  Asymptomatic viral infection induced transaminitis.  Trend.  3.  Dyslipidemia.  He is on statin.  4.  Anxiety and depression.  On PRN  home medications.  5.  Hypokalemia.  Replaced.    6. DM type II.  In poor control due to hyperglycemia, was on no medications at home.  Placed on Lantus, Premeal NovoLog - dose increased along with sliding scale.  Monitor and adjust.  DM and insulin education provided.  Lab Results  Component Value Date   HGBA1C 10.8 (H) 08/15/2020   CBG (last 3)  Recent Labs    08/16/20 1723 08/16/20 2159 08/17/20 0653  GLUCAP 242* 192* 224*       Condition -  Guarded  Family Communication  :   Joshua Bean [phone] on 08/16/2020   number listed for his father is out of service.   Code Status :  Full  Consults  :  None  Procedures  :  None  PUD Prophylaxis : None  Disposition Plan  :    Status is: Inpatient  Remains inpatient appropriate because:IV treatments appropriate due to intensity of illness or inability to take PO   Dispo: The patient is from: Home              Anticipated d/c is to: Home              Anticipated d/c date is: > 3 days              Patient currently is not medically stable to d/c.   DVT Prophylaxis  :  Lovenox   Lab Results  Component Value Date   PLT 229 08/17/2020    Diet :  Diet Order            Diet heart healthy/carb modified Room service appropriate? Yes; Fluid consistency: Thin  Diet effective now                  Inpatient Medications  Scheduled Meds: .  vitamin C  500 mg Oral Daily  . aspirin  81 mg Oral Daily  . atorvastatin  40 mg Oral Daily  . baricitinib  4 mg Oral Daily  . cholecalciferol  1,000 Units Oral Daily  . enoxaparin (LOVENOX) injection  40 mg Subcutaneous Q0600  . insulin aspart  0-20 Units Subcutaneous TID WC  . insulin aspart  0-5 Units Subcutaneous QHS  . insulin aspart  8 Units Subcutaneous TID WC  . insulin glargine  40 Units Subcutaneous Daily  . insulin starter kit- pen needles  1 kit Other Once  . levofloxacin  500 mg Oral Daily  . living well with diabetes book   Does not apply Once  . methylPREDNISolone (SOLU-MEDROL) injection  40 mg Intravenous Q12H  . multivitamin with  minerals  1 tablet Oral Daily  . polyethylene glycol  17 g Oral BID  . potassium chloride  20 mEq Oral Once  . zinc sulfate  220 mg Oral Daily   Continuous Infusions: . remdesivir 100 mg in NS 100 mL 100 mg (08/16/20 0900)   PRN Meds:.acetaminophen, albuterol, ALPRAZolam, chlorpheniramine-HYDROcodone, guaiFENesin-dextromethorphan  Antibiotics  :    Anti-infectives (From admission, onward)   Start     Dose/Rate Route Frequency Ordered Stop   08/16/20 1000  remdesivir 100 mg in sodium chloride 0.9 % 100 mL IVPB       "Followed by" Linked Group Details   100 mg 200 mL/hr over 30 Minutes Intravenous Daily 08/14/20 2355 08/20/20 0959   08/16/20 1000  levofloxacin (LEVAQUIN) tablet 500 mg        500 mg Oral Daily 08/15/20 0720 08/21/20 0959   08/15/20 0245  cefTRIAXone (ROCEPHIN) 1 g in sodium chloride 0.9 % 100 mL IVPB  Status:  Discontinued        1 g 200 mL/hr over 30 Minutes Intravenous Daily 08/15/20 0233 08/15/20 0720   08/15/20 0245  azithromycin (ZITHROMAX) 500 mg in sodium chloride 0.9 % 250 mL IVPB  Status:  Discontinued        500 mg 250 mL/hr over 60 Minutes Intravenous Daily 08/15/20 0233 08/15/20 0720   08/15/20 0100  remdesivir 200 mg in sodium chloride 0.9% 250 mL IVPB       "Followed by" Linked Group Details   200 mg 580  mL/hr over 30 Minutes Intravenous Once 08/14/20 2355 08/15/20 0232       Time Spent in minutes  30   Lala Lund M.D on 08/17/2020 at 11:05 AM  To page go to [email or web address] - password [facility]  [facility] -  Office  [phone]    See all Orders from today for further details    Objective:   Vitals:   08/17/20 0400 08/17/20 0422 08/17/20 0800 08/17/20 0958  BP: 96/68  101/74   Pulse: 88 89 92   Resp: (!) 30 (!) 31 (!) 28   Temp:   98.3 F (36.8 C)   TempSrc:   Oral   SpO2: 91% 95% (!) 83% 90%  Weight:      Height:        Wt Readings from Last 3 Encounters:  08/14/20 95.3 kg  06/04/20 (!) 95.3 kg  11/15/16 105.2 kg     Intake/Output Summary (Last 24 hours) at 08/17/2020 1105 Last data filed at 08/17/2020 0700 Gross per 24 hour  Intake 850 ml  Output 1400 ml  Net -550 ml     Physical Exam  Awake Alert, No new F.N deficits, Normal affect [place].AT,PERRAL Supple Neck,No JVD, No cervical lymphadenopathy appriciated.  Symmetrical Chest wall movement, Good air movement bilaterally, few rales RRR,No Gallops, Rubs or new Murmurs, No Parasternal Heave +ve B.Sounds, Abd Soft, No tenderness, No organomegaly appriciated, No rebound - guarding or rigidity. No Cyanosis, Clubbing or edema, No new Rash or bruise     Data Review:    CBC Recent Labs  Lab 08/14/20 2157 08/16/20 0619 08/17/20 0152  WBC 4.1 7.8 11.4*  HGB 15.9 14.0 13.9  HCT 44.4 40.6 39.7  PLT 163 197 229  MCV 89.3 89.2 90.2  MCH 32.0 30.8 31.6  MCHC 35.8 34.5 35.0  RDW 12.5 12.8 12.7  LYMPHSABS 0.6* 0.5* 0.8  MONOABS 0.3 0.7 0.7  EOSABS 0.0 0.0 0.0  BASOSABS 0.0 0.0 0.0  Recent Labs  Lab 08/14/20 2157 08/14/20 2157 08/14/20 2227 08/15/20 0226 08/15/20 0315 08/15/20 1326 08/15/20 1808 08/16/20 0619 08/17/20 0152  NA 129*   < >  --  132*  --  134* 133* 135 138  K 3.7   < >  --  4.1  --  3.7 3.4* 4.0 3.3*  CL 96*   < >  --  99  --  102 103 105 107  CO2 16*   < >  --  13*  --   15* 18* 18* 22  GLUCOSE 293*   < >  --  361*  --  396* 384* 284* 156*  BUN 15   < >  --  23*  --  26* 25* 25* 21*  CREATININE 1.07   < >  --  1.25*  --  0.99 0.99 0.79 0.67  CALCIUM 8.5*   < >  --  8.0*  --  8.6* 8.2* 8.6* 8.8*  AST 53*  --   --   --   --   --   --  32 38  ALT 41  --   --   --   --   --   --  31 30  ALKPHOS 63  --   --   --   --   --   --  57 56  BILITOT 2.0*  --   --   --   --   --   --  0.9 0.7  ALBUMIN 2.9*  --   --   --   --   --   --  2.2* 2.3*  MG  --   --   --   --  2.2  --   --  2.2 2.2  CRP 20.5*  --   --   --   --   --   --  7.8* 2.5*  DDIMER 2.35*  --   --   --   --   --   --  0.84* 0.71*  PROCALCITON 0.51  --   --   --  0.62  --   --  0.54 0.32  LATICACIDVEN  --   --  2.2*  --  1.5  --   --   --   --   HGBA1C  --   --   --   --  10.8*  --   --   --   --   BNP  --   --   --   --  37.1  --   --  52.6 84.0   < > = values in this interval not displayed.    ------------------------------------------------------------------------------------------------------------------ Recent Labs    08/14/20 2157  TRIG 275*    Lab Results  Component Value Date   HGBA1C 10.8 (H) 08/15/2020   ------------------------------------------------------------------------------------------------------------------ No results for input(s): TSH, T4TOTAL, T3FREE, THYROIDAB in the last 72 hours.  Invalid input(s): FREET3  Cardiac Enzymes No results for input(s): CKMB, TROPONINI, MYOGLOBIN in the last 168 hours.  Invalid input(s): CK ------------------------------------------------------------------------------------------------------------------    Component Value Date/Time   BNP 84.0 08/17/2020 0152    Micro Results Recent Results (from the past 240 hour(s))  Respiratory Panel by RT PCR (Flu A&B, Covid) - Nasopharyngeal Swab     Status: Abnormal   Collection Time: 08/14/20 10:07 PM   Specimen: Nasopharyngeal Swab  Result Value Ref Range Status   SARS Coronavirus 2 by  RT PCR POSITIVE (A) NEGATIVE Final  Comment: RESULT CALLED TO, READ BACK BY AND VERIFIED WITH: Aileen Pilot MD 08/14/20 AT 2315 SK  (NOTE) SARS-CoV-2 target nucleic acids are DETECTED.  SARS-CoV-2 RNA is generally detectable in upper respiratory specimens  during the acute phase of infection. Positive results are indicative of the presence of the identified virus, but do not rule out bacterial infection or co-infection with other pathogens not detected by the test. Clinical correlation with patient history and other diagnostic information is necessary to determine patient infection status. The expected result is Negative.  Fact Sheet for Patients:  PinkCheek.be  Fact Sheet for Healthcare Providers: GravelBags.it  This test is not yet approved or cleared by the [place] FDA and  has been authorized for detection and/or diagnosis of SARS-CoV-2 by FDA under an Emergency Use Authorization (EUA).  This EUA will remain in effect (meaning this test can be  used) for the duration of  the COVID-19 declaration under Section 564(b)(1) of the Act, 21 U.S.C. section 360bbb-3(b)(1), unless the authorization is terminated or revoked sooner.      Influenza A by PCR NEGATIVE NEGATIVE Final   Influenza B by PCR NEGATIVE NEGATIVE Final    Comment: (NOTE) The Xpert Xpress SARS-CoV-2/FLU/RSV assay is intended as an aid in  the diagnosis of influenza from Nasopharyngeal swab specimens and  should not be used as a sole basis for treatment. Nasal washings and  aspirates are unacceptable for Xpert Xpress SARS-CoV-2/FLU/RSV  testing.  Fact Sheet for Patients: PinkCheek.be  Fact Sheet for Healthcare Providers: GravelBags.it  This test is not yet approved or cleared by the [place] FDA and  has been authorized for detection and/or diagnosis of SARS-CoV-2 by  FDA under an  Emergency Use Authorization (EUA). This EUA will remain  in effect (meaning this test can be used) for the duration of the  Covid-19 declaration under Section 564(b)(1) of the Act, 21  U.S.C. section 360bbb-3(b)(1), unless the authorization is  terminated or revoked. Performed at [facility], [place] [address], [place], [place] [address]   Blood Culture (routine x 2)     Status: None (Preliminary result)   Collection Time: 08/14/20 10:07 PM   Specimen: BLOOD  Result Value Ref Range Status   Specimen Description BLOOD RIGHT ANTECUBITAL  Final   Special Requests   Final    BOTTLES DRAWN AEROBIC AND ANAEROBIC Blood Culture adequate volume   Culture   Final    NO GROWTH 2 DAYS Performed at [facility], [place] [address], [place], [place] [address]    Report Status PENDING  Incomplete  Blood Culture (routine x 2)     Status: None (Preliminary result)   Collection Time: 08/14/20 10:13 PM   Specimen: BLOOD  Result Value Ref Range Status   Specimen Description BLOOD LEFT ANTECUBITAL  Final   Special Requests   Final    BOTTLES DRAWN AEROBIC AND ANAEROBIC Blood Culture adequate volume   Culture   Final    NO GROWTH 2 DAYS Performed at [facility], [place] [address] [place], [place] [address]    Report Status PENDING  Incomplete  MRSA PCR Screening     Status: None   Collection Time: 08/15/20  2:28 PM   Specimen: Urine, Clean Catch; Nasopharyngeal  Result Value Ref Range Status   MRSA by PCR NEGATIVE NEGATIVE Final    Comment:        The GeneXpert MRSA Assay (FDA approved for NASAL specimens only), is one component  of a comprehensive MRSA colonization surveillance program. It is not intended to diagnose MRSA infection nor to guide or monitor treatment for MRSA infections. Performed at [facility], [place] [address], [place], [place] [address]     Radiology Reports DG Chest Warrior 1 View  Result Date: 08/17/2020 CLINICAL DATA:  Shortness of breath, hypoxia,  COVID-19 positive EXAM: PORTABLE CHEST 1 VIEW COMPARISON:  08/14/2020 FINDINGS: The heart size and mediastinal contours are within normal limits. Patchy airspace opacities throughout both lung fields with a predominantly basilar and peripheral distribution. No appreciable interval progression from prior. No pleural effusion or pneumothorax. The visualized skeletal structures are unremarkable. IMPRESSION: Patchy bilateral airspace opacities compatible with multifocal atypical/viral pneumonia. No appreciable interval progression from prior. Electronically Signed   By: Davina Poke D.O.   On: 08/17/2020 08:47   DG Chest Port 1 View  Result Date: 08/14/2020 CLINICAL DATA:  Shortness of breath.  COVID positive. EXAM: PORTABLE CHEST 1 VIEW COMPARISON:  06/05/2016 FINDINGS: Lung volumes are low. Patchy heterogeneous bilateral airspace opacities in a mid-lower lung zone predominant distribution. Normal heart size for technique. No evidence of pneumomediastinum. No significant pleural effusion. No pneumothorax. No acute osseous abnormalities are seen. Overlying oxygen tubing and EKG leads. IMPRESSION: Patchy heterogeneous bilateral airspace opacities consistent with COVID-19 pneumonia. Electronically Signed   By: Keith Rake M.D.   On: 08/14/2020 22:27   VAS [place] LOWER EXTREMITY VENOUS (DVT)  Result Date: 08/16/2020  Lower Venous DVT Study Indications: Covid-19, elevated D-Dimer.  Comparison Study: No prior study on file Performing Technologist: Sharion Dove RVS  Examination Guidelines: A complete evaluation includes B-mode imaging, spectral Doppler, color Doppler, and power Doppler as needed of all accessible portions of each vessel. Bilateral testing is considered an integral part of a complete examination. Limited examinations for reoccurring indications may be performed as noted. The reflux portion of the exam is performed with the patient in reverse Trendelenburg.   +---------+---------------+---------+-----------+----------+--------------+ RIGHT    CompressibilityPhasicitySpontaneityPropertiesThrombus Aging +---------+---------------+---------+-----------+----------+--------------+ CFV      Full           Yes      Yes                                 +---------+---------------+---------+-----------+----------+--------------+ SFJ      Full                                                        +---------+---------------+---------+-----------+----------+--------------+ FV Prox  Full                                                        +---------+---------------+---------+-----------+----------+--------------+ FV Mid   Full                                                        +---------+---------------+---------+-----------+----------+--------------+ FV DistalFull                                                        +---------+---------------+---------+-----------+----------+--------------+  PFV      Full                                                        +---------+---------------+---------+-----------+----------+--------------+ POP      Full           Yes      Yes                                 +---------+---------------+---------+-----------+----------+--------------+ PTV      Full                                                        +---------+---------------+---------+-----------+----------+--------------+ PERO     Full                                                        +---------+---------------+---------+-----------+----------+--------------+   +---------+---------------+---------+-----------+----------+--------------+ LEFT     CompressibilityPhasicitySpontaneityPropertiesThrombus Aging +---------+---------------+---------+-----------+----------+--------------+ CFV      Full           Yes      Yes                                  +---------+---------------+---------+-----------+----------+--------------+ SFJ      Full                                                        +---------+---------------+---------+-----------+----------+--------------+ FV Prox  Full                                                        +---------+---------------+---------+-----------+----------+--------------+ FV Mid   Full                                                        +---------+---------------+---------+-----------+----------+--------------+ FV DistalFull                                                        +---------+---------------+---------+-----------+----------+--------------+ PFV      Full                                                        +---------+---------------+---------+-----------+----------+--------------+   POP      Full           Yes      Yes                                 +---------+---------------+---------+-----------+----------+--------------+ PTV      Full                                                        +---------+---------------+---------+-----------+----------+--------------+ PERO     Full                                                        +---------+---------------+---------+-----------+----------+--------------+     Summary: BILATERAL: - No evidence of deep vein thrombosis seen in the lower extremities, bilaterally. -   *See table(s) above for measurements and observations. Electronically signed by Harold Barban MD on 08/16/2020 at 55:41:42 PM.    Final

## 2020-08-18 ENCOUNTER — Encounter (HOSPITAL_COMMUNITY): Payer: Self-pay | Admitting: Internal Medicine

## 2020-08-18 DIAGNOSIS — U071 COVID-19: Secondary | ICD-10-CM | POA: Diagnosis not present

## 2020-08-18 DIAGNOSIS — J1282 Pneumonia due to Coronavirus disease 2019: Secondary | ICD-10-CM | POA: Diagnosis not present

## 2020-08-18 LAB — COMPREHENSIVE METABOLIC PANEL
ALT: 32 U/L (ref 0–44)
AST: 32 U/L (ref 15–41)
Albumin: 2.2 g/dL — ABNORMAL LOW (ref 3.5–5.0)
Alkaline Phosphatase: 64 U/L (ref 38–126)
Anion gap: 11 (ref 5–15)
BUN: 24 mg/dL — ABNORMAL HIGH (ref 6–20)
CO2: 23 mmol/L (ref 22–32)
Calcium: 8.6 mg/dL — ABNORMAL LOW (ref 8.9–10.3)
Chloride: 105 mmol/L (ref 98–111)
Creatinine, Ser: 0.69 mg/dL (ref 0.61–1.24)
GFR calc Af Amer: >60 mL/min (ref >60)
GFR calc non Af Amer: >60 mL/min (ref >60)
Glucose, Bld: 183 mg/dL — ABNORMAL HIGH (ref 70–99)
Potassium: 3.9 mmol/L (ref 3.5–5.1)
Sodium: 139 mmol/L (ref 135–145)
Total Bilirubin: 0.6 mg/dL (ref 0.3–1.2)
Total Protein: 5.2 g/dL — ABNORMAL LOW (ref 6.5–8.1)

## 2020-08-18 LAB — CBC WITH DIFFERENTIAL/PLATELET
Abs Immature Granulocytes: 0 10*3/uL (ref 0.00–0.07)
Basophils Absolute: 0 10*3/uL (ref 0.0–0.1)
Basophils Relative: 0 %
Eosinophils Absolute: 0 10*3/uL (ref 0.0–0.5)
Eosinophils Relative: 0 %
HCT: 40.2 % (ref 39.0–52.0)
Hemoglobin: 13.9 g/dL (ref 13.0–17.0)
Lymphocytes Relative: 3 %
Lymphs Abs: 0.3 10*3/uL — ABNORMAL LOW (ref 0.7–4.0)
MCH: 31.7 pg (ref 26.0–34.0)
MCHC: 34.6 g/dL (ref 30.0–36.0)
MCV: 91.6 fL (ref 80.0–100.0)
Monocytes Absolute: 0.4 10*3/uL (ref 0.1–1.0)
Monocytes Relative: 4 %
Neutro Abs: 10.3 10*3/uL — ABNORMAL HIGH (ref 1.7–7.7)
Neutrophils Relative %: 93 %
Platelets: 247 10*3/uL (ref 150–400)
RBC: 4.39 MIL/uL (ref 4.22–5.81)
RDW: 12.9 % (ref 11.5–15.5)
WBC: 11.1 10*3/uL — ABNORMAL HIGH (ref 4.0–10.5)
nRBC: 0.4 % — ABNORMAL HIGH (ref 0.0–0.2)
nRBC: 1 /100 WBC — ABNORMAL HIGH

## 2020-08-18 LAB — PROCALCITONIN: Procalcitonin: 0.14 ng/mL

## 2020-08-18 LAB — BRAIN NATRIURETIC PEPTIDE: B Natriuretic Peptide: 74.4 pg/mL (ref 0.0–100.0)

## 2020-08-18 LAB — GLUCOSE, CAPILLARY
Glucose-Capillary: 192 mg/dL — ABNORMAL HIGH (ref 70–99)
Glucose-Capillary: 243 mg/dL — ABNORMAL HIGH (ref 70–99)
Glucose-Capillary: 227 mg/dL — ABNORMAL HIGH (ref 70–99)
Glucose-Capillary: 203 mg/dL — ABNORMAL HIGH (ref 70–99)

## 2020-08-18 LAB — D-DIMER, QUANTITATIVE: D-Dimer, Quant: 0.8 ug/mL-FEU — ABNORMAL HIGH (ref 0.00–0.50)

## 2020-08-18 LAB — C-REACTIVE PROTEIN: CRP: 1.6 mg/dL — ABNORMAL HIGH (ref <1.0)

## 2020-08-18 LAB — MAGNESIUM: Magnesium: 2.1 mg/dL (ref 1.7–2.4)

## 2020-08-18 MED ORDER — FUROSEMIDE 40 MG PO TABS
40.0000 mg | ORAL_TABLET | Freq: Once | ORAL | Status: AC
  Administered 2020-08-18: 40 mg via ORAL
  Filled 2020-08-18: qty 1

## 2020-08-18 MED ORDER — LACTATED RINGERS IV BOLUS
1000.0000 mL | Freq: Once | INTRAVENOUS | Status: AC
  Administered 2020-08-18: 1000 mL via INTRAVENOUS

## 2020-08-18 MED ORDER — PNEUMOCOCCAL VAC POLYVALENT 25 MCG/0.5ML IJ INJ
0.5000 mL | INJECTION | INTRAMUSCULAR | Status: AC
  Administered 2020-08-19: 0.5 mL via INTRAMUSCULAR
  Filled 2020-08-18: qty 0.5

## 2020-08-18 MED ORDER — ENOXAPARIN SODIUM 40 MG/0.4ML ~~LOC~~ SOLN
40.0000 mg | Freq: Every day | SUBCUTANEOUS | Status: DC
  Administered 2020-08-19 – 2020-08-29 (×11): 40 mg via SUBCUTANEOUS
  Filled 2020-08-18 (×12): qty 0.4

## 2020-08-18 NOTE — Progress Notes (Signed)
RT NOTES: Transported patient from 4E to room 5W25 on NRB mask.. Once in room placed back on HHFNC 35L/80%.

## 2020-08-18 NOTE — Progress Notes (Signed)
NURSING PROGRESS NOTE  Joshua Bean [id number] Transfer Data: 08/18/2020 2:18 PM Attending Provider: Thurnell Lose, MD KDT:OIZTI, Coralie Keens, NP Code Status: FULL  Joshua Bean is a 44 y.o. male patient transferred from 4E -No acute distress noted.  -No complaints of shortness of breath.  -No complaints of chest pain.   Cardiac Monitoring: Box # [id number] in place. Cardiac monitor yields:normal sinus rhythm.  Blood pressure (!) 88/64, pulse 87, temperature 98.7 F (37.1 C), temperature source Oral, resp. rate 20, height 5\' 11"  (1.803 m), weight 95.3 kg, SpO2 92 %.   IV Fluids:  IV in place, occlusive dsg intact without redness, IV cath antecubital right, condition patent and no redness and left, condition patent and no redness none.   Allergies:  Bee venom  Past Medical History:   has a past medical history of Anxiety, Chicken pox, and Skin cancer of face.  Past Surgical History:   has a past surgical history that includes Back surgery and Neck surgery.  Social History:   reports that he has never smoked. His smokeless tobacco use includes chew. He reports previous alcohol use. He reports that he does not use drugs.  Skin: intact  Patient/Family orientated to room. Information packet given to patient/family. Admission inpatient armband information verified with patient/family to include name and date of birth and placed on patient arm. Side rails up x 2, fall assessment and education completed with patient/family. Patient/family able to verbalize understanding of risk associated with falls and verbalized understanding to call for assistance before getting out of bed. Call light within reach. Patient/family able to voice and demonstrate understanding of unit orientation instructions.    Will continue to evaluate and treat per MD orders.

## 2020-08-18 NOTE — Plan of Care (Signed)
  Problem: Coping: Goal: Psychosocial and spiritual needs will be supported Outcome: Progressing   Problem: Respiratory: Goal: Will maintain a patent airway Outcome: Progressing Goal: Complications related to the disease process, condition or treatment will be avoided or minimized Outcome: Progressing   Problem: Education: Goal: Knowledge of General Education information will improve Description: Including pain rating scale, medication(s)/side effects and non-pharmacologic comfort measures Outcome: Progressing   Problem: Health Behavior/Discharge Planning: Goal: Ability to manage health-related needs will improve Outcome: Progressing   Problem: Clinical Measurements: Goal: Ability to maintain clinical measurements within normal limits will improve Outcome: Progressing Goal: Will remain free from infection Outcome: Progressing Goal: Diagnostic test results will improve Outcome: Progressing Goal: Respiratory complications will improve Outcome: Progressing Goal: Cardiovascular complication will be avoided Outcome: Progressing   Problem: Activity: Goal: Risk for activity intolerance will decrease Outcome: Progressing   Problem: Nutrition: Goal: Adequate nutrition will be maintained Outcome: Progressing   Problem: Coping: Goal: Level of anxiety will decrease Outcome: Progressing   Problem: Elimination: Goal: Will not experience complications related to bowel motility Outcome: Progressing Goal: Will not experience complications related to urinary retention Outcome: Progressing   Problem: Pain Managment: Goal: General experience of comfort will improve Outcome: Progressing   Problem: Safety: Goal: Ability to remain free from injury will improve Outcome: Progressing   Problem: Skin Integrity: Goal: Risk for impaired skin integrity will decrease Outcome: Progressing

## 2020-08-18 NOTE — Progress Notes (Signed)
   08/18/20 1216  Assess: MEWS Score  Temp 97.8 F (36.6 C)  BP 94/72  Pulse Rate 88  ECG Heart Rate 86  Resp (!) 21  Level of Consciousness Alert  SpO2 90 %  O2 Device HFNC (heated )  Heater temperature 91.8 F (33.2 C)  O2 Flow Rate (L/min) 35 L/min  FiO2 (%) 80 %  Assess: MEWS Score  MEWS Temp 0  MEWS Systolic 1  MEWS Pulse 0  MEWS RR 1  MEWS LOC 0  MEWS Score 2  MEWS Score Color Yellow  Assess: if the MEWS score is Yellow or Red  Were vital signs taken at a resting state? Yes  Focused Assessment No change from prior assessment  Early Detection of Sepsis Score *See Row Information* Low  MEWS guidelines implemented *See Row Information* Yes  Treat  MEWS Interventions Other (Comment)  Pain Scale 0-10  Pain Score 0  Take Vital Signs  Increase Vital Sign Frequency  Yellow: Q 2hr X 2 then Q 4hr X 2, if remains yellow, continue Q 4hrs  Notify: Charge Nurse/RN  Name of Charge Nurse/RN Notified Phoebe Perch RN  Date Charge Nurse/RN Notified 08/18/20  Time Charge Nurse/RN Notified 1222

## 2020-08-18 NOTE — Progress Notes (Signed)
PROGRESS NOTE                                                                                                                                                                                                             Patient Demographics:    Joshua Bean, is a 44 y.o. male, DOB - [birth year], ZOX:[id number]  Outpatient Primary MD for the patient is Jearld Fenton, NP    LOS - 3  Admit date - 08/14/2020    Chief Complaint  Patient presents with  . Shortness of Breath       Brief Narrative - Joshua Bean is a 44 y.o. male with medical history significant of anxiety, hyperlipidemia, type 2 diabetes not on any medications presenting with a chief complaint of shortness of breath.  He was diagnosed with acute hypoxic respiratory failure due to DKA along with poorly controlled blood sugars likely early DKA and he was admitted to the hospital.   Subjective:   Patient in bed, appears comfortable, denies any headache, no fever, no chest pain or pressure, mild shortness of breath , no abdominal pain. No focal weakness.    Assessment  & Plan :     1. Acute Hypoxic Resp. Failure due to Acute Covid 19 Viral Pneumonitis during the ongoing 2020 Covid 19 Pandemic - he is unfortunately unvaccinated and he has incurred severe parenchymal lung injury, has been started on combination of IV steroids, baricitinib and steroids.  Continue to monitor on 35 lits HHFL.  As he has few crackles trial of Lasix on 08/18/2020, overall tenuous.  Encouraged the patient to sit up in chair in the daytime use I-S and flutter valve for pulmonary toiletry and then prone in bed when at night.  Will advance activity and titrate down oxygen as possible.   SpO2: 96 % O2 Flow Rate (L/min): 35 L/min FiO2 (%): (S) 90 %  Recent Labs  Lab 08/14/20 2157 08/14/20 2207 08/14/20 2227 08/15/20 0315 08/16/20 0619 08/17/20 0152 08/18/20 0143  WBC 4.1  --   --   --   7.8 11.4* 11.1*  PLT 163  --   --   --  197 229 247  CRP 20.5*  --   --   --  7.8* 2.5* 1.6*  BNP  --   --   --  37.1 52.6  84.0 74.4  DDIMER 2.35*  --   --   --  0.84* 0.71* 0.80*  PROCALCITON 0.51  --   --  0.62 0.54 0.32 0.14  AST 53*  --   --   --  32 38 32  ALT 41  --   --   --  31 30 32  ALKPHOS 63  --   --   --  57 56 64  BILITOT 2.0*  --   --   --  0.9 0.7 0.6  ALBUMIN 2.9*  --   --   --  2.2* 2.3* 2.2*  LATICACIDVEN  --   --  2.2* 1.5  --   --   --   SARSCOV2NAA  --  POSITIVE*  --   --   --   --   --     2.  Asymptomatic viral infection induced transaminitis.  Trend.  3.  Dyslipidemia.  He is on statin.  4.  Anxiety and depression.  On PRN  home medications.  5.  Hypokalemia.  Replaced.    6. DM type II.  In poor control due to hyperglycemia, was on no medications at home.  Placed on Lantus, Premeal NovoLog - dose increased along with sliding scale.  Monitor and adjust.  DM and insulin education provided.  Lab Results  Component Value Date   HGBA1C 10.8 (H) 08/15/2020   CBG (last 3)  Recent Labs    08/17/20 1721 08/17/20 2145 08/18/20 0536  GLUCAP 209* 229* 192*       Condition -  Guarded  Family Communication  :   Tyler Aas [phone] on 08/16/2020   number listed for his father is out of service.   Code Status :  Full  Consults  :  None  Procedures  :  None  PUD Prophylaxis : None  Disposition Plan  :    Status is: Inpatient  Remains inpatient appropriate because:IV treatments appropriate due to intensity of illness or inability to take PO   Dispo: The patient is from: Home              Anticipated d/c is to: Home              Anticipated d/c date is: > 3 days              Patient currently is not medically stable to d/c.   DVT Prophylaxis  :  Lovenox   Lab Results  Component Value Date   PLT 247 08/18/2020    Diet :  Diet Order            Diet heart healthy/carb modified Room service appropriate? Yes; Fluid consistency:  Thin  Diet effective now                  Inpatient Medications  Scheduled Meds: . vitamin C  500 mg Oral Daily  . aspirin  81 mg Oral Daily  . atorvastatin  40 mg Oral Daily  . baricitinib  4 mg Oral Daily  . cholecalciferol  1,000 Units Oral Daily  . [START ON 08/19/2020] enoxaparin (LOVENOX) injection  40 mg Subcutaneous Q0600  . furosemide  40 mg Oral Once  . insulin aspart  0-20 Units Subcutaneous TID WC  . insulin aspart  0-5 Units Subcutaneous QHS  . insulin aspart  8 Units Subcutaneous TID WC  . insulin glargine  40 Units Subcutaneous Daily  . insulin starter kit- pen needles  1  kit Other Once  . levofloxacin  500 mg Oral Daily  . living well with diabetes book   Does not apply Once  . methylPREDNISolone (SOLU-MEDROL) injection  40 mg Intravenous Q12H  . multivitamin with minerals  1 tablet Oral Daily  . polyethylene glycol  17 g Oral BID  . zinc sulfate  220 mg Oral Daily   Continuous Infusions: . remdesivir 100 mg in NS 100 mL 100 mg (08/18/20 0923)   PRN Meds:.acetaminophen, albuterol, ALPRAZolam, chlorpheniramine-HYDROcodone, guaiFENesin-dextromethorphan  Antibiotics  :    Anti-infectives (From admission, onward)   Start     Dose/Rate Route Frequency Ordered Stop   08/16/20 1000  remdesivir 100 mg in sodium chloride 0.9 % 100 mL IVPB       "Followed by" Linked Group Details   100 mg 200 mL/hr over 30 Minutes Intravenous Daily 08/14/20 2355 08/20/20 0959   08/16/20 1000  levofloxacin (LEVAQUIN) tablet 500 mg        500 mg Oral Daily 08/15/20 0720 08/21/20 0959   08/15/20 0245  cefTRIAXone (ROCEPHIN) 1 g in sodium chloride 0.9 % 100 mL IVPB  Status:  Discontinued        1 g 200 mL/hr over 30 Minutes Intravenous Daily 08/15/20 0233 08/15/20 0720   08/15/20 0245  azithromycin (ZITHROMAX) 500 mg in sodium chloride 0.9 % 250 mL IVPB  Status:  Discontinued        500 mg 250 mL/hr over 60 Minutes Intravenous Daily 08/15/20 0233 08/15/20 0720   08/15/20 0100   remdesivir 200 mg in sodium chloride 0.9% 250 mL IVPB       "Followed by" Linked Group Details   200 mg 580 mL/hr over 30 Minutes Intravenous Once 08/14/20 2355 08/15/20 0232       Time Spent in minutes  30   Lala Lund M.D on 08/18/2020 at 9:58 AM  To page go to [email or web address] - password [facility]  [facility] -  Office  [phone]    See all Orders from today for further details    Objective:   Vitals:   08/18/20 0105 08/18/20 0317 08/18/20 0400 08/18/20 0742  BP: (!) 84/64  93/60   Pulse: 78  82   Resp: (!) 34  (!) 29   Temp: 98.5 F (36.9 C)  98.3 F (36.8 C)   TempSrc: Oral  Oral   SpO2: 95% 96% 100% 96%  Weight:      Height:        Wt Readings from Last 3 Encounters:  08/14/20 95.3 kg  06/04/20 (!) 95.3 kg  11/15/16 105.2 kg     Intake/Output Summary (Last 24 hours) at 08/18/2020 0958 Last data filed at 08/18/2020 0546 Gross per 24 hour  Intake --  Output 2700 ml  Net -2700 ml     Physical Exam  Awake Alert, No new F.N deficits, Normal affect [place].AT,PERRAL Supple Neck,No JVD, No cervical lymphadenopathy appriciated.  Symmetrical Chest wall movement, Good air movement bilaterally, few rales RRR,No Gallops, Rubs or new Murmurs, No Parasternal Heave +ve B.Sounds, Abd Soft, No tenderness, No organomegaly appriciated, No rebound - guarding or rigidity. No Cyanosis, Clubbing or edema, No new Rash or bruise     Data Review:    CBC Recent Labs  Lab 08/14/20 2157 08/16/20 0619 08/17/20 0152 08/18/20 0143  WBC 4.1 7.8 11.4* 11.1*  HGB 15.9 14.0 13.9 13.9  HCT 44.4 40.6 39.7 40.2  PLT 163 197 229 247  MCV 89.3 89.2 90.2 91.6  MCH 32.0 30.8 31.6 31.7  MCHC 35.8 34.5 35.0 34.6  RDW 12.5 12.8 12.7 12.9  LYMPHSABS 0.6* 0.5* 0.8 0.3*  MONOABS 0.3 0.7 0.7 0.4  EOSABS 0.0 0.0 0.0 0.0  BASOSABS 0.0 0.0 0.0 0.0    Recent Labs  Lab 08/14/20 2157 08/14/20 2227 08/15/20 0226 08/15/20 0315 08/15/20 1326 08/15/20 1808 08/16/20 0619  08/17/20 0152 08/18/20 0143  NA 129*  --    < >  --  134* 133* 135 138 139  K 3.7  --    < >  --  3.7 3.4* 4.0 3.3* 3.9  CL 96*  --    < >  --  102 103 105 107 105  CO2 16*  --    < >  --  15* 18* 18* 22 23  GLUCOSE 293*  --    < >  --  396* 384* 284* 156* 183*  BUN 15  --    < >  --  26* 25* 25* 21* 24*  CREATININE 1.07  --    < >  --  0.99 0.99 0.79 0.67 0.69  CALCIUM 8.5*  --    < >  --  8.6* 8.2* 8.6* 8.8* 8.6*  AST 53*  --   --   --   --   --  32 38 32  ALT 41  --   --   --   --   --  31 30 32  ALKPHOS 63  --   --   --   --   --  57 56 64  BILITOT 2.0*  --   --   --   --   --  0.9 0.7 0.6  ALBUMIN 2.9*  --   --   --   --   --  2.2* 2.3* 2.2*  MG  --   --   --  2.2  --   --  2.2 2.2 2.1  CRP 20.5*  --   --   --   --   --  7.8* 2.5* 1.6*  DDIMER 2.35*  --   --   --   --   --  0.84* 0.71* 0.80*  PROCALCITON 0.51  --   --  0.62  --   --  0.54 0.32 0.14  LATICACIDVEN  --  2.2*  --  1.5  --   --   --   --   --   HGBA1C  --   --   --  10.8*  --   --   --   --   --   BNP  --   --   --  37.1  --   --  52.6 84.0 74.4   < > = values in this interval not displayed.    ------------------------------------------------------------------------------------------------------------------ No results for input(s): CHOL, HDL, LDLCALC, TRIG, CHOLHDL, LDLDIRECT in the last 72 hours.  Lab Results  Component Value Date   HGBA1C 10.8 (H) 08/15/2020   ------------------------------------------------------------------------------------------------------------------ No results for input(s): TSH, T4TOTAL, T3FREE, THYROIDAB in the last 72 hours.  Invalid input(s): FREET3  Cardiac Enzymes No results for input(s): CKMB, TROPONINI, MYOGLOBIN in the last 168 hours.  Invalid input(s): CK ------------------------------------------------------------------------------------------------------------------    Component Value Date/Time   BNP 74.4 08/18/2020 0143    Micro Results Recent Results (from the  past 240 hour(s))  Respiratory Panel by RT PCR (Flu A&B, Covid) - Nasopharyngeal Swab     Status: Abnormal   Collection  Time: 08/14/20 10:07 PM   Specimen: Nasopharyngeal Swab  Result Value Ref Range Status   SARS Coronavirus 2 by RT PCR POSITIVE (A) NEGATIVE Final    Comment: RESULT CALLED TO, READ BACK BY AND VERIFIED WITH: Aileen Pilot MD 08/14/20 AT 2315 SK  (NOTE) SARS-CoV-2 target nucleic acids are DETECTED.  SARS-CoV-2 RNA is generally detectable in upper respiratory specimens  during the acute phase of infection. Positive results are indicative of the presence of the identified virus, but do not rule out bacterial infection or co-infection with other pathogens not detected by the test. Clinical correlation with patient history and other diagnostic information is necessary to determine patient infection status. The expected result is Negative.  Fact Sheet for Patients:  PinkCheek.be  Fact Sheet for Healthcare Providers: GravelBags.it  This test is not yet approved or cleared by the [place] FDA and  has been authorized for detection and/or diagnosis of SARS-CoV-2 by FDA under an Emergency Use Authorization (EUA).  This EUA will remain in effect (meaning this test can be  used) for the duration of  the COVID-19 declaration under Section 564(b)(1) of the Act, 21 U.S.C. section 360bbb-3(b)(1), unless the authorization is terminated or revoked sooner.      Influenza A by PCR NEGATIVE NEGATIVE Final   Influenza B by PCR NEGATIVE NEGATIVE Final    Comment: (NOTE) The Xpert Xpress SARS-CoV-2/FLU/RSV assay is intended as an aid in  the diagnosis of influenza from Nasopharyngeal swab specimens and  should not be used as a sole basis for treatment. Nasal washings and  aspirates are unacceptable for Xpert Xpress SARS-CoV-2/FLU/RSV  testing.  Fact Sheet for Patients: PinkCheek.be  Fact  Sheet for Healthcare Providers: GravelBags.it  This test is not yet approved or cleared by the [place] FDA and  has been authorized for detection and/or diagnosis of SARS-CoV-2 by  FDA under an Emergency Use Authorization (EUA). This EUA will remain  in effect (meaning this test can be used) for the duration of the  Covid-19 declaration under Section 564(b)(1) of the Act, 21  U.S.C. section 360bbb-3(b)(1), unless the authorization is  terminated or revoked. Performed at [facility], [place] [address], [place], [place] [address]   Blood Culture (routine x 2)     Status: None (Preliminary result)   Collection Time: 08/14/20 10:07 PM   Specimen: BLOOD  Result Value Ref Range Status   Specimen Description BLOOD RIGHT ANTECUBITAL  Final   Special Requests   Final    BOTTLES DRAWN AEROBIC AND ANAEROBIC Blood Culture adequate volume   Culture   Final    NO GROWTH 4 DAYS Performed at [facility], [place] [address] [place], [place] [address]    Report Status PENDING  Incomplete  Blood Culture (routine x 2)     Status: None (Preliminary result)   Collection Time: 08/14/20 10:13 PM   Specimen: BLOOD  Result Value Ref Range Status   Specimen Description BLOOD LEFT ANTECUBITAL  Final   Special Requests   Final    BOTTLES DRAWN AEROBIC AND ANAEROBIC Blood Culture adequate volume   Culture   Final    NO GROWTH 4 DAYS Performed at [facility], [place] [address], [place], [place] [address]    Report Status PENDING  Incomplete  MRSA PCR Screening     Status: None   Collection Time: 08/15/20  2:28 PM   Specimen: Urine, Clean Catch; Nasopharyngeal  Result Value Ref Range Status  MRSA by PCR NEGATIVE NEGATIVE Final    Comment:        The GeneXpert MRSA Assay (FDA approved for NASAL specimens only), is one component of a comprehensive MRSA colonization surveillance program. It is not intended to diagnose MRSA infection nor to guide  or monitor treatment for MRSA infections. Performed at [facility], [place] [address], [place], [place] [address]     Radiology Reports DG Chest [place] 1 View  Result Date: 08/17/2020 CLINICAL DATA:  Shortness of breath, hypoxia, COVID-19 positive EXAM: PORTABLE CHEST 1 VIEW COMPARISON:  08/14/2020 FINDINGS: The heart size and mediastinal contours are within normal limits. Patchy airspace opacities throughout both lung fields with a predominantly basilar and peripheral distribution. No appreciable interval progression from prior. No pleural effusion or pneumothorax. The visualized skeletal structures are unremarkable. IMPRESSION: Patchy bilateral airspace opacities compatible with multifocal atypical/viral pneumonia. No appreciable interval progression from prior. Electronically Signed   By: Davina Poke D.O.   On: 08/17/2020 08:47   DG Chest Port 1 View  Result Date: 08/14/2020 CLINICAL DATA:  Shortness of breath.  COVID positive. EXAM: PORTABLE CHEST 1 VIEW COMPARISON:  06/05/2016 FINDINGS: Lung volumes are low. Patchy heterogeneous bilateral airspace opacities in a mid-lower lung zone predominant distribution. Normal heart size for technique. No evidence of pneumomediastinum. No significant pleural effusion. No pneumothorax. No acute osseous abnormalities are seen. Overlying oxygen tubing and EKG leads. IMPRESSION: Patchy heterogeneous bilateral airspace opacities consistent with COVID-19 pneumonia. Electronically Signed   By: Keith Rake M.D.   On: 08/14/2020 22:27   VAS [place] LOWER EXTREMITY VENOUS (DVT)  Result Date: 08/16/2020  Lower Venous DVT Study Indications: Covid-19, elevated D-Dimer.  Comparison Study: No prior study on file Performing Technologist: Sharion Dove RVS  Examination Guidelines: A complete evaluation includes B-mode imaging, spectral Doppler, color Doppler, and power Doppler as needed of all accessible portions of each vessel. Bilateral testing is considered an  integral part of a complete examination. Limited examinations for reoccurring indications may be performed as noted. The reflux portion of the exam is performed with the patient in reverse Trendelenburg.  +---------+---------------+---------+-----------+----------+--------------+ RIGHT    CompressibilityPhasicitySpontaneityPropertiesThrombus Aging +---------+---------------+---------+-----------+----------+--------------+ CFV      Full           Yes      Yes                                 +---------+---------------+---------+-----------+----------+--------------+ SFJ      Full                                                        +---------+---------------+---------+-----------+----------+--------------+ FV Prox  Full                                                        +---------+---------------+---------+-----------+----------+--------------+ FV Mid   Full                                                        +---------+---------------+---------+-----------+----------+--------------+  FV DistalFull                                                        +---------+---------------+---------+-----------+----------+--------------+ PFV      Full                                                        +---------+---------------+---------+-----------+----------+--------------+ POP      Full           Yes      Yes                                 +---------+---------------+---------+-----------+----------+--------------+ PTV      Full                                                        +---------+---------------+---------+-----------+----------+--------------+ PERO     Full                                                        +---------+---------------+---------+-----------+----------+--------------+   +---------+---------------+---------+-----------+----------+--------------+ LEFT     CompressibilityPhasicitySpontaneityPropertiesThrombus  Aging +---------+---------------+---------+-----------+----------+--------------+ CFV      Full           Yes      Yes                                 +---------+---------------+---------+-----------+----------+--------------+ SFJ      Full                                                        +---------+---------------+---------+-----------+----------+--------------+ FV Prox  Full                                                        +---------+---------------+---------+-----------+----------+--------------+ FV Mid   Full                                                        +---------+---------------+---------+-----------+----------+--------------+ FV DistalFull                                                        +---------+---------------+---------+-----------+----------+--------------+  PFV      Full                                                        +---------+---------------+---------+-----------+----------+--------------+ POP      Full           Yes      Yes                                 +---------+---------------+---------+-----------+----------+--------------+ PTV      Full                                                        +---------+---------------+---------+-----------+----------+--------------+ PERO     Full                                                        +---------+---------------+---------+-----------+----------+--------------+     Summary: BILATERAL: - No evidence of deep vein thrombosis seen in the lower extremities, bilaterally. -   *See table(s) above for measurements and observations. Electronically signed by Harold Barban MD on 08/16/2020 at 32:41:42 PM.    Final

## 2020-08-18 NOTE — Plan of Care (Signed)
  Problem: Coping: Goal: Psychosocial and spiritual needs will be supported 08/18/2020 0502 by Florestine Avers, RN Outcome: Progressing 08/18/2020 0502 by Florestine Avers, RN Outcome: Progressing   Problem: Respiratory: Goal: Will maintain a patent airway 08/18/2020 0502 by Florestine Avers, RN Outcome: Progressing 08/18/2020 0502 by Florestine Avers, RN Outcome: Progressing Goal: Complications related to the disease process, condition or treatment will be avoided or minimized 08/18/2020 0502 by Florestine Avers, RN Outcome: Progressing 08/18/2020 0502 by Florestine Avers, RN Outcome: Progressing   Problem: Education: Goal: Knowledge of General Education information will improve Description: Including pain rating scale, medication(s)/side effects and non-pharmacologic comfort measures 08/18/2020 0502 by Florestine Avers, RN Outcome: Progressing 08/18/2020 0502 by Florestine Avers, RN Outcome: Progressing   Problem: Health Behavior/Discharge Planning: Goal: Ability to manage health-related needs will improve 08/18/2020 0502 by Florestine Avers, RN Outcome: Progressing 08/18/2020 0502 by Florestine Avers, RN Outcome: Progressing   Problem: Clinical Measurements: Goal: Ability to maintain clinical measurements within normal limits will improve 08/18/2020 0502 by Florestine Avers, RN Outcome: Progressing 08/18/2020 0502 by Florestine Avers, RN Outcome: Progressing Goal: Will remain free from infection 08/18/2020 0502 by Florestine Avers, RN Outcome: Progressing 08/18/2020 0502 by Florestine Avers, RN Outcome: Progressing Goal: Diagnostic test results will improve 08/18/2020 0502 by Florestine Avers, RN Outcome: Progressing 08/18/2020 0502 by Florestine Avers, RN Outcome: Progressing Goal: Respiratory complications will improve 08/18/2020 0502 by Florestine Avers, RN Outcome: Progressing 08/18/2020 0502 by Florestine Avers,  RN Outcome: Progressing Goal: Cardiovascular complication will be avoided 08/18/2020 0502 by Florestine Avers, RN Outcome: Progressing 08/18/2020 0502 by Florestine Avers, RN Outcome: Progressing   Problem: Activity: Goal: Risk for activity intolerance will decrease 08/18/2020 0502 by Florestine Avers, RN Outcome: Progressing 08/18/2020 0502 by Florestine Avers, RN Outcome: Progressing   Problem: Nutrition: Goal: Adequate nutrition will be maintained 08/18/2020 0502 by Florestine Avers, RN Outcome: Progressing 08/18/2020 0502 by Florestine Avers, RN Outcome: Progressing   Problem: Coping: Goal: Level of anxiety will decrease 08/18/2020 0502 by Florestine Avers, RN Outcome: Progressing 08/18/2020 0502 by Florestine Avers, RN Outcome: Progressing   Problem: Elimination: Goal: Will not experience complications related to bowel motility 08/18/2020 0502 by Florestine Avers, RN Outcome: Progressing 08/18/2020 0502 by Florestine Avers, RN Outcome: Progressing Goal: Will not experience complications related to urinary retention 08/18/2020 0502 by Florestine Avers, RN Outcome: Progressing 08/18/2020 0502 by Florestine Avers, RN Outcome: Progressing   Problem: Pain Managment: Goal: General experience of comfort will improve 08/18/2020 0502 by Florestine Avers, RN Outcome: Progressing 08/18/2020 0502 by Florestine Avers, RN Outcome: Progressing   Problem: Safety: Goal: Ability to remain free from injury will improve 08/18/2020 0502 by Florestine Avers, RN Outcome: Progressing 08/18/2020 0502 by Florestine Avers, RN Outcome: Progressing   Problem: Skin Integrity: Goal: Risk for impaired skin integrity will decrease 08/18/2020 0502 by Florestine Avers, RN Outcome: Progressing 08/18/2020 0502 by Florestine Avers, RN Outcome: Progressing

## 2020-08-19 DIAGNOSIS — U071 COVID-19: Secondary | ICD-10-CM | POA: Diagnosis not present

## 2020-08-19 DIAGNOSIS — J1282 Pneumonia due to Coronavirus disease 2019: Secondary | ICD-10-CM | POA: Diagnosis not present

## 2020-08-19 LAB — CBC WITH DIFFERENTIAL/PLATELET
Abs Immature Granulocytes: 0.89 10*3/uL — ABNORMAL HIGH (ref 0.00–0.07)
Basophils Absolute: 0 10*3/uL (ref 0.0–0.1)
Basophils Relative: 0 %
Eosinophils Absolute: 0 10*3/uL (ref 0.0–0.5)
Eosinophils Relative: 0 %
HCT: 41.2 % (ref 39.0–52.0)
Hemoglobin: 14.1 g/dL (ref 13.0–17.0)
Immature Granulocytes: 6 %
Lymphocytes Relative: 7 %
Lymphs Abs: 1 10*3/uL (ref 0.7–4.0)
MCH: 31.1 pg (ref 26.0–34.0)
MCHC: 34.2 g/dL (ref 30.0–36.0)
MCV: 90.7 fL (ref 80.0–100.0)
Monocytes Absolute: 0.8 10*3/uL (ref 0.1–1.0)
Monocytes Relative: 6 %
Neutro Abs: 11.9 10*3/uL — ABNORMAL HIGH (ref 1.7–7.7)
Neutrophils Relative %: 81 %
Platelets: 299 10*3/uL (ref 150–400)
RBC: 4.54 MIL/uL (ref 4.22–5.81)
RDW: 12.4 % (ref 11.5–15.5)
WBC: 14.3 10*3/uL — ABNORMAL HIGH (ref 4.0–10.5)
nRBC: 0.1 % (ref 0.0–0.2)

## 2020-08-19 LAB — COMPREHENSIVE METABOLIC PANEL
ALT: 28 U/L (ref 0–44)
AST: 25 U/L (ref 15–41)
Albumin: 2.4 g/dL — ABNORMAL LOW (ref 3.5–5.0)
Alkaline Phosphatase: 69 U/L (ref 38–126)
Anion gap: 15 (ref 5–15)
BUN: 21 mg/dL — ABNORMAL HIGH (ref 6–20)
CO2: 24 mmol/L (ref 22–32)
Calcium: 8.4 mg/dL — ABNORMAL LOW (ref 8.9–10.3)
Chloride: 100 mmol/L (ref 98–111)
Creatinine, Ser: 0.67 mg/dL (ref 0.61–1.24)
GFR calc non Af Amer: >60 mL/min (ref >60)
Glucose, Bld: 133 mg/dL — ABNORMAL HIGH (ref 70–99)
Potassium: 4 mmol/L (ref 3.5–5.1)
Sodium: 139 mmol/L (ref 135–145)
Total Bilirubin: 0.8 mg/dL (ref 0.3–1.2)
Total Protein: 5.5 g/dL — ABNORMAL LOW (ref 6.5–8.1)

## 2020-08-19 LAB — CULTURE, BLOOD (ROUTINE X 2)
Culture: NO GROWTH
Culture: NO GROWTH
Special Requests: ADEQUATE
Special Requests: ADEQUATE

## 2020-08-19 LAB — GLUCOSE, CAPILLARY
Glucose-Capillary: 146 mg/dL — ABNORMAL HIGH (ref 70–99)
Glucose-Capillary: 208 mg/dL — ABNORMAL HIGH (ref 70–99)
Glucose-Capillary: 112 mg/dL — ABNORMAL HIGH (ref 70–99)
Glucose-Capillary: 109 mg/dL — ABNORMAL HIGH (ref 70–99)

## 2020-08-19 LAB — C-REACTIVE PROTEIN: CRP: 0.7 mg/dL (ref <1.0)

## 2020-08-19 LAB — PROCALCITONIN: Procalcitonin: <0.1 ng/mL

## 2020-08-19 LAB — D-DIMER, QUANTITATIVE: D-Dimer, Quant: 1.16 ug/mL-FEU — ABNORMAL HIGH (ref 0.00–0.50)

## 2020-08-19 LAB — MAGNESIUM: Magnesium: 2 mg/dL (ref 1.7–2.4)

## 2020-08-19 LAB — BRAIN NATRIURETIC PEPTIDE: B Natriuretic Peptide: 79.5 pg/mL (ref 0.0–100.0)

## 2020-08-19 MED ORDER — PREDNISONE 20 MG PO TABS
60.0000 mg | ORAL_TABLET | Freq: Every day | ORAL | Status: DC
  Administered 2020-08-20 – 2020-08-21 (×2): 60 mg via ORAL
  Filled 2020-08-19 (×2): qty 3

## 2020-08-19 MED ORDER — INFLUENZA VAC SPLIT QUAD 0.5 ML IM SUSY
0.5000 mL | PREFILLED_SYRINGE | INTRAMUSCULAR | Status: DC
  Filled 2020-08-19: qty 0.5

## 2020-08-19 NOTE — Progress Notes (Signed)
Physical Therapy Treatment Patient Details Name: Joshua Bean MRN: [id number] DOB: 09/08/1976 Today's Date: 08/19/2020    History of Present Illness Joshua Bean is a 44 y.o. male with medical history significant of anxiety, hyperlipidemia, type 2 diabetes not on any medications presenting with a chief complaint of shortness of breath.  He did have a known Covid exposure.  Satting 84% on room air with EMS, improved to 90% with 6 L supplemental oxygen.  Tachycardic with heart rate in the 110s.  Tachypneic with respiratory rate in the 20s.  Patient states he has not been vaccinated against Covid.    PT Comments    Pt supine in bed on entry agreeable to therapy, but frustrated by limitations of HHF O2. Pt is missing his children. Pt is limited in safe mobility by brain fog in presence of decreased strength and endurance. Pt sometimes needs to be told things 2-3 times before it sinks in. Pt is mod I for bed mobility, transfers and ambulation to and from bed to recliner. Pt reports recliner is too uncomfortable to sit up. During session RT adjusted O2 (see General comments) levels down and pt able to maintain SaO2 >90%O2.  D/c plans remain appropriate. PT will continue to follow acutely.   Follow Up Recommendations  Home health PT     Equipment Recommendations  None recommended by PT;Other (comment) (TBD, dont anticipate needing anything)       Precautions / Restrictions Precautions Precautions: Fall Precaution Comments: mod fall Restrictions Weight Bearing Restrictions: No    Mobility  Bed Mobility Overal bed mobility: Modified Independent                Transfers Overall transfer level: Modified independent Equipment used: None                Ambulation/Gait Ambulation/Gait assistance: Modified independent (Device/Increase time) Gait Distance (Feet): 5 Feet Assistive device: None Gait Pattern/deviations: Step-through pattern;Decreased step length - right;Decreased  step length - left Gait velocity: decr   General Gait Details: Pt limited by HHF oxygen tubing, ambulates to and from chair      Balance Overall balance assessment: Needs assistance Sitting-balance support: Feet supported Sitting balance-Leahy Scale: Good     Standing balance support: During functional activity Standing balance-Leahy Scale: Fair Standing balance comment: patient slightly unsteady and fatigued with activity.                            Cognition Arousal/Alertness: Awake/alert Behavior During Therapy: WFL for tasks assessed/performed Overall Cognitive Status: Within Functional Limits for tasks assessed                                        Exercises General Exercises - Lower Extremity Mini-Sqauts: Strengthening;5 reps;Standing;Limitations (reports quad pain, decreased quad strength) Other Exercises Other Exercises: LAQ x 5 reps bilaterally, unable to achieve more due to weakness    General Comments General comments (skin integrity, edema, etc.): On entry pt on 30 L HHFNC 80%FiO2, 98-100%O2, dropped to 96%O2 with activity, RT came in room during session and reduced to 25L O2 via HHFNC with 60% FiO2, SaO2 90-92%O2 with transfer from recliner to bed      Pertinent Vitals/Pain Pain Assessment: No/denies pain           PT Goals (current goals can now be found in the care  plan section) Acute Rehab PT Goals Patient Stated Goal: to get better PT Goal Formulation: With patient Time For Goal Achievement: 08/31/20 Potential to Achieve Goals: Good Progress towards PT goals: Progressing toward goals    Frequency    Min 3X/week      PT Plan Current plan remains appropriate       AM-PAC PT "6 Clicks" Mobility   Outcome Measure  Help needed turning from your back to your side while in a flat bed without using bedrails?: A Little Help needed moving from lying on your back to sitting on the side of a flat bed without using  bedrails?: A Little Help needed moving to and from a bed to a chair (including a wheelchair)?: A Little Help needed standing up from a chair using your arms (e.g., wheelchair or bedside chair)?: A Little Help needed to walk in hospital room?: A Little Help needed climbing 3-5 steps with a railing? : A Lot 6 Click Score: 17    End of Session Equipment Utilized During Treatment: Oxygen Activity Tolerance: Patient limited by fatigue Patient left: in bed;with call bell/phone within reach Nurse Communication: Mobility status PT Visit Diagnosis: Unsteadiness on feet (R26.81);Muscle weakness (generalized) (M62.81);Difficulty in walking, not elsewhere classified (R26.2)     Time: 1527-1600 PT Time Calculation (min) (ACUTE ONLY): 33 min  Charges:  $Therapeutic Exercise: 8-22 mins $Therapeutic Activity: 8-22 mins                     Kaela Beitz B. Beverely Risen PT, DPT Acute Rehabilitation Services Pager [phone] Office [phone]    Elon Alas Fleet 08/19/2020, 5:02 PM

## 2020-08-19 NOTE — Progress Notes (Signed)
   08/18/20 2017  Assess: MEWS Score  Temp 98.7 F (37.1 C)  BP (!) 82/60  Pulse Rate 93  ECG Heart Rate 93  Resp (!) 29  Level of Consciousness Alert  SpO2 94 %  O2 Device HFNC  O2 Flow Rate (L/min) 30 L/min  FiO2 (%) 80 %  Assess: MEWS Score  MEWS Temp 0  MEWS Systolic 1  MEWS Pulse 0  MEWS RR 2  MEWS LOC 0  MEWS Score 3  MEWS Score Color Yellow  Assess: if the MEWS score is Yellow or Red  Were vital signs taken at a resting state? Yes  Focused Assessment No change from prior assessment  Early Detection of Sepsis Score *See Row Information* High  MEWS guidelines implemented *See Row Information* No, previously yellow, continue vital signs every 4 hours  Treat  Pain Scale 0-10  Pain Score 0  Take Vital Signs  Increase Vital Sign Frequency  Yellow: Q 2hr X 2 then Q 4hr X 2, if remains yellow, continue Q 4hrs  Escalate  MEWS: Escalate Yellow: discuss with charge nurse/RN and consider discussing with provider and RRT  Notify: Charge Nurse/RN  Name of Charge Nurse/RN Notified Ronalee Belts RN  Date Charge Nurse/RN Notified 08/18/20  Time Charge Nurse/RN Notified 2034  Notify: Provider  Provider Name/Title Chotiner   Date Provider Notified 08/18/20  Time Provider Notified 2034  Notification Type Page  Notification Reason Other (Comment) (yellow MEWS)  Response See new orders  Date of Provider Response 08/18/20  Time of Provider Response 2040

## 2020-08-19 NOTE — Progress Notes (Addendum)
Joshua Bean  PIR:[id number] DOB: Jun 23, 1976 DOA: 08/14/2020 PCP: Jearld Fenton, NP    Brief Narrative:  44 year old with a history of anxiety, HLD, and DM two who presented to the ED with severe shortness of breath following a known Covid exposure. He was found to have a saturation of 84% on room air and was tachycardic. He stated he had been sick for approximately 1 week with fever, headache, nausea, and poor intake. In the ED he was found to have a temperature of 102.9 and was tachycardic. SARS-CoV-2 PCR was positive.  Significant Events:  10/1 admit via ED  Date of Positive COVID Test:  08/14/2020  Vaccination Status: Not vaccinated against Covid  COVID-19 specific Treatment: Steroid 10/1 > Remdesivir 10/2 >10/6 Baricitinib 10/2 >  Antimicrobials:  Azithromycin 10/1 Levaquin 10/3 >  DVT prophylaxis: Lovenox  Subjective: Continues to require heated high flow nasal cannula support, presently at 35 L and 80%. Saturations are stable with the support. CBGs improving.  No new complaints today.  Feels that he is slowly getting better.  Denies chest pain nausea vomiting or abdominal pain.  Assessment & Plan:  COVID Pneumonia -acute hypoxic respiratory failure - sepsis ruled out  Being treated with steroid, baricitinib -has completed a course of remdesivir -wean oxygen as able but continues to require high flow nasal cannula support  Recent Labs  Lab 08/14/20 2157 08/14/20 2157 08/15/20 0315 08/16/20 0619 08/17/20 0152 08/18/20 0143 08/19/20 0717  DDIMER 2.35*  --   --  0.84* 0.71* 0.80* 1.16*  FERRITIN 3,479*  --   --   --   --   --   --   CRP 20.5*  --   --  7.8* 2.5* 1.6* 0.7  ALT 41  --   --  31 30 32 28  PROCALCITON 0.51   < > 0.62 0.54 0.32 0.14 <0.10   < > = values in this interval not displayed.    Mild transaminitis Likely due to Covid itself with possible contribution to Remdesivir -resolved  Uncontrolled DM2 -severe hyperglycemia CBG much improved now  - follow without change in treatment today  Acute kidney injury  crt 1.25 at presentation - resolved w/ volume expansion   HLD Continue usual statin   Anxiety/depression  Code Status: FULL CODE Family Communication:  Status is: Inpatient  Remains inpatient appropriate because:Inpatient level of care appropriate due to severity of illness   Dispo: The patient is from: Home              Anticipated d/c is to: Home              Anticipated d/c date is: 3 days              Patient currently is not medically stable to d/c.   Consultants:  none  Objective: Blood pressure 100/70, pulse 94, temperature 98.2 F (36.8 C), temperature source Oral, resp. rate 20, height '5\' 11"'  (1.803 m), weight 95.3 kg, SpO2 91 %.  Intake/Output Summary (Last 24 hours) at 08/19/2020 1832 Last data filed at 08/19/2020 0841 Gross per 24 hour  Intake 460 ml  Output 150 ml  Net 310 ml   Filed Weights   08/14/20 2231  Weight: 95.3 kg    Examination: General: Not in extremis despite requirement for heated high flow nasal cannula Lungs: Fine diffuse crackles bilaterally but no wheezing Cardiovascular: Regular rate and rhythm without murmur gallop or rub normal S1 and S2 Abdomen: Nontender, nondistended, soft,  bowel sounds positive, no rebound, no ascites, no appreciable mass Extremities: No significant cyanosis, clubbing, or edema bilateral lower extremities  CBC: Recent Labs  Lab 08/17/20 0152 08/18/20 0143 08/19/20 0717  WBC 11.4* 11.1* 14.3*  NEUTROABS 9.8* 10.3* 11.9*  HGB 13.9 13.9 14.1  HCT 39.7 40.2 41.2  MCV 90.2 91.6 90.7  PLT 229 247 128   Basic Metabolic Panel: Recent Labs  Lab 08/17/20 0152 08/18/20 0143 08/19/20 0717  NA 138 139 139  K 3.3* 3.9 4.0  CL 107 105 100  CO2 '22 23 24  ' GLUCOSE 156* 183* 133*  BUN 21* 24* 21*  CREATININE 0.67 0.69 0.67  CALCIUM 8.8* 8.6* 8.4*  MG 2.2 2.1 2.0   GFR: Estimated Creatinine Clearance: 140.3 mL/min (by C-G formula based on SCr  of 0.67 mg/dL).  Liver Function Tests: Recent Labs  Lab 08/16/20 0619 08/17/20 0152 08/18/20 0143 08/19/20 0717  AST 32 38 32 25  ALT 31 30 32 28  ALKPHOS 57 56 64 69  BILITOT 0.9 0.7 0.6 0.8  PROT 5.6* 5.6* 5.2* 5.5*  ALBUMIN 2.2* 2.3* 2.2* 2.4*    HbA1C: Hgb A1c MFr Bld  Date/Time Value Ref Range Status  08/15/2020 03:15 AM 10.8 (H) 4.8 - 5.6 % Final    Comment:    (NOTE) Pre diabetes:          5.7%-6.4%  Diabetes:              >6.4%  Glycemic control for   <7.0% adults with diabetes   06/04/2020 12:52 PM 11.1 (H) 4.8 - 5.6 % Final    Comment:             Prediabetes: 5.7 - 6.4          Diabetes: >6.4          Glycemic control for adults with diabetes: <7.0     CBG: Recent Labs  Lab 08/18/20 1633 08/18/20 2109 08/19/20 0743 08/19/20 1215 08/19/20 1608  GLUCAP 227* 203* 146* 208* 112*    Recent Results (from the past 240 hour(s))  Respiratory Panel by RT PCR (Flu A&B, Covid) - Nasopharyngeal Swab     Status: Abnormal   Collection Time: 08/14/20 10:07 PM   Specimen: Nasopharyngeal Swab  Result Value Ref Range Status   SARS Coronavirus 2 by RT PCR POSITIVE (A) NEGATIVE Final    Comment: RESULT CALLED TO, READ BACK BY AND VERIFIED WITH: Aileen Pilot MD 08/14/20 AT 2315 SK  (NOTE) SARS-CoV-2 target nucleic acids are DETECTED.  SARS-CoV-2 RNA is generally detectable in upper respiratory specimens  during the acute phase of infection. Positive results are indicative of the presence of the identified virus, but do not rule out bacterial infection or co-infection with other pathogens not detected by the test. Clinical correlation with patient history and other diagnostic information is necessary to determine patient infection status. The expected result is Negative.  Fact Sheet for Patients:  PinkCheek.be  Fact Sheet for Healthcare Providers: GravelBags.it  This test is not yet approved or  cleared by the [place] FDA and  has been authorized for detection and/or diagnosis of SARS-CoV-2 by FDA under an Emergency Use Authorization (EUA).  This EUA will remain in effect (meaning this test can be  used) for the duration of  the COVID-19 declaration under Section 564(b)(1) of the Act, 21 U.S.C. section 360bbb-3(b)(1), unless the authorization is terminated or revoked sooner.      Influenza A by PCR NEGATIVE NEGATIVE Final  Influenza B by PCR NEGATIVE NEGATIVE Final    Comment: (NOTE) The Xpert Xpress SARS-CoV-2/FLU/RSV assay is intended as an aid in  the diagnosis of influenza from Nasopharyngeal swab specimens and  should not be used as a sole basis for treatment. Nasal washings and  aspirates are unacceptable for Xpert Xpress SARS-CoV-2/FLU/RSV  testing.  Fact Sheet for Patients: PinkCheek.be  Fact Sheet for Healthcare Providers: GravelBags.it  This test is not yet approved or cleared by the [place] FDA and  has been authorized for detection and/or diagnosis of SARS-CoV-2 by  FDA under an Emergency Use Authorization (EUA). This EUA will remain  in effect (meaning this test can be used) for the duration of the  Covid-19 declaration under Section 564(b)(1) of the Act, 21  U.S.C. section 360bbb-3(b)(1), unless the authorization is  terminated or revoked. Performed at [facility], [place] [address] [place], [place] [address]   Blood Culture (routine x 2)     Status: None   Collection Time: 08/14/20 10:07 PM   Specimen: BLOOD  Result Value Ref Range Status   Specimen Description BLOOD RIGHT ANTECUBITAL  Final   Special Requests   Final    BOTTLES DRAWN AEROBIC AND ANAEROBIC Blood Culture adequate volume   Culture   Final    NO GROWTH 5 DAYS Performed at [facility], [place] [address] [place], [place] [address]    Report Status 08/19/2020 FINAL  Final  Blood Culture (routine x 2)      Status: None   Collection Time: 08/14/20 10:13 PM   Specimen: BLOOD  Result Value Ref Range Status   Specimen Description BLOOD LEFT ANTECUBITAL  Final   Special Requests   Final    BOTTLES DRAWN AEROBIC AND ANAEROBIC Blood Culture adequate volume   Culture   Final    NO GROWTH 5 DAYS Performed at [facility], [place] [address] [place], [place] [address]    Report Status 08/19/2020 FINAL  Final  MRSA PCR Screening     Status: None   Collection Time: 08/15/20  2:28 PM   Specimen: Urine, Clean Catch; Nasopharyngeal  Result Value Ref Range Status   MRSA by PCR NEGATIVE NEGATIVE Final    Comment:        The GeneXpert MRSA Assay (FDA approved for NASAL specimens only), is one component of a comprehensive MRSA colonization surveillance program. It is not intended to diagnose MRSA infection nor to guide or monitor treatment for MRSA infections. Performed at [facility], [place] [address] [place], [place] [address]      Scheduled Meds: . vitamin C  500 mg Oral Daily  . aspirin  81 mg Oral Daily  . atorvastatin  40 mg Oral Daily  . baricitinib  4 mg Oral Daily  . cholecalciferol  1,000 Units Oral Daily  . enoxaparin (LOVENOX) injection  40 mg Subcutaneous Q0600  . insulin aspart  0-20 Units Subcutaneous TID WC  . insulin aspart  0-5 Units Subcutaneous QHS  . insulin aspart  8 Units Subcutaneous TID WC  . insulin glargine  40 Units Subcutaneous Daily  . insulin starter kit- pen needles  1 kit Other Once  . levofloxacin  500 mg Oral Daily  . living well with diabetes book   Does not apply Once  . methylPREDNISolone (SOLU-MEDROL) injection  40 mg Intravenous Q12H  . multivitamin with minerals  1 tablet Oral Daily  . polyethylene glycol  17 g Oral BID  .  zinc sulfate  220 mg Oral Daily     LOS: 4 days   Cherene Altes, MD [facility] Office  [phone] Pager - Text Page per Amion  If 7PM-7AM, please contact night-coverage per Amion 08/19/2020, 6:32  PM

## 2020-08-20 DIAGNOSIS — J1282 Pneumonia due to Coronavirus disease 2019: Secondary | ICD-10-CM | POA: Diagnosis not present

## 2020-08-20 DIAGNOSIS — U071 COVID-19: Secondary | ICD-10-CM | POA: Diagnosis not present

## 2020-08-20 LAB — CBC WITH DIFFERENTIAL/PLATELET
Abs Immature Granulocytes: 1.05 10*3/uL — ABNORMAL HIGH (ref 0.00–0.07)
Basophils Absolute: 0.1 10*3/uL (ref 0.0–0.1)
Basophils Relative: 1 %
Eosinophils Absolute: 0 10*3/uL (ref 0.0–0.5)
Eosinophils Relative: 0 %
HCT: 42.4 % (ref 39.0–52.0)
Hemoglobin: 14.5 g/dL (ref 13.0–17.0)
Immature Granulocytes: 6 %
Lymphocytes Relative: 7 %
Lymphs Abs: 1.1 10*3/uL (ref 0.7–4.0)
MCH: 30.6 pg (ref 26.0–34.0)
MCHC: 34.2 g/dL (ref 30.0–36.0)
MCV: 89.5 fL (ref 80.0–100.0)
Monocytes Absolute: 1.2 10*3/uL — ABNORMAL HIGH (ref 0.1–1.0)
Monocytes Relative: 7 %
Neutro Abs: 13.5 10*3/uL — ABNORMAL HIGH (ref 1.7–7.7)
Neutrophils Relative %: 79 %
Platelets: 346 10*3/uL (ref 150–400)
RBC: 4.74 MIL/uL (ref 4.22–5.81)
RDW: 12.3 % (ref 11.5–15.5)
WBC: 16.9 10*3/uL — ABNORMAL HIGH (ref 4.0–10.5)
nRBC: 0 % (ref 0.0–0.2)

## 2020-08-20 LAB — COMPREHENSIVE METABOLIC PANEL
ALT: 27 U/L (ref 0–44)
AST: 21 U/L (ref 15–41)
Albumin: 2.4 g/dL — ABNORMAL LOW (ref 3.5–5.0)
Alkaline Phosphatase: 67 U/L (ref 38–126)
Anion gap: 12 (ref 5–15)
BUN: 18 mg/dL (ref 6–20)
CO2: 24 mmol/L (ref 22–32)
Calcium: 8.3 mg/dL — ABNORMAL LOW (ref 8.9–10.3)
Chloride: 102 mmol/L (ref 98–111)
Creatinine, Ser: 0.6 mg/dL — ABNORMAL LOW (ref 0.61–1.24)
GFR calc non Af Amer: >60 mL/min (ref >60)
Glucose, Bld: 68 mg/dL — ABNORMAL LOW (ref 70–99)
Potassium: 3.1 mmol/L — ABNORMAL LOW (ref 3.5–5.1)
Sodium: 138 mmol/L (ref 135–145)
Total Bilirubin: 1 mg/dL (ref 0.3–1.2)
Total Protein: 5.4 g/dL — ABNORMAL LOW (ref 6.5–8.1)

## 2020-08-20 LAB — PROCALCITONIN: Procalcitonin: <0.1 ng/mL

## 2020-08-20 LAB — GLUCOSE, CAPILLARY
Glucose-Capillary: 77 mg/dL (ref 70–99)
Glucose-Capillary: 120 mg/dL — ABNORMAL HIGH (ref 70–99)
Glucose-Capillary: 126 mg/dL — ABNORMAL HIGH (ref 70–99)
Glucose-Capillary: 102 mg/dL — ABNORMAL HIGH (ref 70–99)

## 2020-08-20 LAB — MAGNESIUM: Magnesium: 1.9 mg/dL (ref 1.7–2.4)

## 2020-08-20 LAB — D-DIMER, QUANTITATIVE: D-Dimer, Quant: 1.04 ug/mL-FEU — ABNORMAL HIGH (ref 0.00–0.50)

## 2020-08-20 LAB — PATHOLOGIST SMEAR REVIEW

## 2020-08-20 LAB — C-REACTIVE PROTEIN: CRP: 0.7 mg/dL (ref <1.0)

## 2020-08-20 MED ORDER — POTASSIUM CHLORIDE CRYS ER 20 MEQ PO TBCR
40.0000 meq | EXTENDED_RELEASE_TABLET | Freq: Once | ORAL | Status: AC
  Administered 2020-08-20: 40 meq via ORAL
  Filled 2020-08-20: qty 2

## 2020-08-20 MED ORDER — SODIUM CHLORIDE 0.9 % IV BOLUS
500.0000 mL | Freq: Once | INTRAVENOUS | Status: AC
  Administered 2020-08-20: 500 mL via INTRAVENOUS

## 2020-08-20 MED ORDER — INSULIN ASPART 100 UNIT/ML ~~LOC~~ SOLN: 4.0000 [IU] | Freq: Three times a day (TID) | SUBCUTANEOUS | Status: DC

## 2020-08-20 NOTE — Progress Notes (Signed)
   08/20/20 0827  Assess: MEWS Score  Temp 98.7 F (37.1 C)  BP (!) 88/50  Pulse Rate 100  ECG Heart Rate (!) 102  Resp 20  Level of Consciousness Alert  SpO2 96 %  O2 Device HFNC  Heater temperature 91.4 F (33 C)  O2 Flow Rate (L/min) 25 L/min  FiO2 (%) 60 %  Assess: MEWS Score  MEWS Temp 0  MEWS Systolic 1  MEWS Pulse 1  MEWS RR 0  MEWS LOC 0  MEWS Score 2  MEWS Score Color Yellow  Assess: if the MEWS score is Yellow or Red  Were vital signs taken at a resting state? Yes  Focused Assessment No change from prior assessment  Early Detection of Sepsis Score *See Row Information* Medium  MEWS guidelines implemented *See Row Information* Yes  Treat  Pain Scale 0-10  Pain Score 0  Take Vital Signs  Increase Vital Sign Frequency  Yellow: Q 2hr X 2 then Q 4hr X 2, if remains yellow, continue Q 4hrs  Escalate  MEWS: Escalate Yellow: discuss with charge nurse/RN and consider discussing with provider and RRT  Notify: Charge Nurse/RN  Name of Charge Nurse/RN Notified Charlynne Cousins  Date Charge Nurse/RN Notified 08/21/19  Time Charge Nurse/RN Notified 3154  Notify: Provider  Provider Name/Title Dr. Thereasa Solo  Date Provider Notified 08/20/20  Time Provider Notified [phone]  Notification Type Page  Notification Reason Other (Comment) (MEWS score, per protocol)  Response See new orders  Date of Provider Response 08/20/20  Time of Provider Response 1000  Notify: Rapid Response  Name of Rapid Response RN Notified N/A

## 2020-08-20 NOTE — Progress Notes (Signed)
Joshua Bean  [id number]:[id number] DOB: 02/18/1976 DOA: 08/14/2020 PCP: Jearld Fenton, NP    Brief Narrative:  44 year old with a history of anxiety, HLD, and DM two who presented to the ED with severe shortness of breath following a known Covid exposure. He was found to have a saturation of 84% on room air and was tachycardic. He stated he had been sick for approximately 1 week with fever, headache, nausea, and poor intake. In the ED he was found to have a temperature of 102.9 and was tachycardic. SARS-CoV-2 PCR was positive.  Significant Events:  10/1 admit via ED  Date of Positive COVID Test:  08/14/2020  Vaccination Status: Not vaccinated against Covid  COVID-19 specific Treatment: Steroid 10/1 > Remdesivir 10/2 >10/6 Baricitinib 10/2 >  Antimicrobials:  Azithromycin 10/1 Levaquin 10/3 >  DVT prophylaxis: Lovenox  Subjective: Intermittently tachycardic and mildly hypotensive with systolics in the mid to upper 80s.  Continues to require high flow nasal cannula oxygen support.  Mildly hypokalemic. No new complaints. Does report having a panic attack earlier in the day, which is not a new thing for him.   Assessment & Plan:  COVID Pneumonia -acute hypoxic respiratory failure - sepsis ruled out  Being treated with steroid, baricitinib -has completed a course of remdesivir -wean oxygen as able but continues to require high flow nasal cannula support - making very slow progress at this time   Recent Labs  Lab 08/14/20 2157 08/15/20 0315 08/16/20 0619 08/17/20 0152 08/18/20 0143 08/19/20 0717 08/20/20 0300  DDIMER 2.35*  --  0.84* 0.71* 0.80* 1.16* 1.04*  FERRITIN 3,479*  --   --   --   --   --   --   CRP 20.5*  --  7.8* 2.5* 1.6* 0.7 0.7  ALT 41  --  31 30 32 28 27  PROCALCITON 0.51   < > 0.54 0.32 0.14 <0.10 <0.10   < > = values in this interval not displayed.    Mild transaminitis Likely due to Covid itself with possible contribution from Remdesivir  -resolved  Uncontrolled DM2 -severe hyperglycemia CBG much improved at this time  Hypokalemia Supplement and follow-up in a.m. -check magnesium  Acute kidney injury  crt 1.25 at presentation - resolved w/ volume expansion -creatinine presently normal  HLD Continue usual statin   Anxiety/depression  Code Status: FULL CODE Family Communication:  Status is: Inpatient  Remains inpatient appropriate because:Inpatient level of care appropriate due to severity of illness   Dispo: The patient is from: Home              Anticipated d/c is to: Home              Anticipated d/c date is: 3 days              Patient currently is not medically stable to d/c.   Consultants:  none  Objective: Blood pressure (!) 88/50, pulse 100, temperature 98.7 F (37.1 C), temperature source Oral, resp. rate 20, height _0  (1.803 m), weight 95.3 kg, SpO2 96 %.  Intake/Output Summary (Last 24 hours) at 08/20/2020 0950 Last data filed at 08/20/2020 0841 Gross per 24 hour  Intake 720 ml  Output 1050 ml  Net -330 ml   Filed Weights   08/14/20 2231  Weight: 95.3 kg    Examination: General: Not in extremis  Lungs: Fine crackles diffusely bilateral fields with no wheezing Cardiovascular: RRR without murmur Abdomen: NT/ND, soft, BS positive, no rebound  Extremities: No significant cyanosis, clubbing, or edema bilateral lower extremities  CBC: Recent Labs  Lab 08/18/20 0143 08/19/20 0717 08/20/20 0300  WBC 11.1* 14.3* 16.9*  NEUTROABS 10.3* 11.9* 13.5*  HGB 13.9 14.1 14.5  HCT 40.2 41.2 42.4  MCV 91.6 90.7 89.5  PLT 247 299 437   Basic Metabolic Panel: Recent Labs  Lab 08/18/20 0143 08/19/20 0717 08/20/20 0300  NA 139 139 138  K 3.9 4.0 3.1*  CL 105 100 102  CO2 _0 GLUCOSE 183* 133* 68*  BUN 24* 21* 18  CREATININE 0.69 0.67 0.60*  CALCIUM 8.6* 8.4* 8.3*  MG 2.1 2.0 1.9   GFR: Estimated Creatinine Clearance: 140.3 mL/min (A) (by C-G formula based on SCr of 0.6 mg/dL  (L)).  Liver Function Tests: Recent Labs  Lab 08/17/20 0152 08/18/20 0143 08/19/20 0717 08/20/20 0300  AST 38 32 25 21  ALT 30 32 28 27  ALKPHOS 56 64 69 67  BILITOT 0.7 0.6 0.8 1.0  PROT 5.6* 5.2* 5.5* 5.4*  ALBUMIN 2.3* 2.2* 2.4* 2.4*    HbA1C: Hgb A1c MFr Bld  Date/Time Value Ref Range Status  08/15/2020 03:15 AM 10.8 (H) 4.8 - 5.6 % Final    Comment:    (NOTE) Pre diabetes:          5.7%-6.4%  Diabetes:              >6.4%  Glycemic control for   <7.0% adults with diabetes   06/04/2020 12:52 PM 11.1 (H) 4.8 - 5.6 % Final    Comment:             Prediabetes: 5.7 - 6.4          Diabetes: >6.4          Glycemic control for adults with diabetes: <7.0     CBG: Recent Labs  Lab 08/19/20 0743 08/19/20 1215 08/19/20 1608 08/19/20 2139 08/20/20 0718  GLUCAP 146* 208* 112* 109* 77    Recent Results (from the past 240 hour(s))  Respiratory Panel by RT PCR (Flu A&B, Covid) - Nasopharyngeal Swab     Status: Abnormal   Collection Time: 08/14/20 10:07 PM   Specimen: Nasopharyngeal Swab  Result Value Ref Range Status   SARS Coronavirus 2 by RT PCR POSITIVE (A) NEGATIVE Final    Comment: RESULT CALLED TO, READ BACK BY AND VERIFIED WITH: Aileen Pilot MD 08/14/20 AT 2315 SK  (NOTE) SARS-CoV-2 target nucleic acids are DETECTED.  SARS-CoV-2 RNA is generally detectable in upper respiratory specimens  during the acute phase of infection. Positive results are indicative of the presence of the identified virus, but do not rule out bacterial infection or co-infection with other pathogens not detected by the test. Clinical correlation with patient history and other diagnostic information is necessary to determine patient infection status. The expected result is Negative.  Fact Sheet for Patients:  PinkCheek.be  Fact Sheet for Healthcare Providers: GravelBags.it  This test is not yet approved or cleared by the  [place] FDA and  has been authorized for detection and/or diagnosis of SARS-CoV-2 by FDA under an Emergency Use Authorization (EUA).  This EUA will remain in effect (meaning this test can be  used) for the duration of  the COVID-19 declaration under Section 564(b)(1) of the Act, 21 U.S.C. section 360bbb-3(b)(1), unless the authorization is terminated or revoked sooner.      Influenza A by PCR NEGATIVE NEGATIVE Final   Influenza B by PCR NEGATIVE NEGATIVE  Final    Comment: (NOTE) The Xpert Xpress SARS-CoV-2/FLU/RSV assay is intended as an aid in  the diagnosis of influenza from Nasopharyngeal swab specimens and  should not be used as a sole basis for treatment. Nasal washings and  aspirates are unacceptable for Xpert Xpress SARS-CoV-2/FLU/RSV  testing.  Fact Sheet for Patients: PinkCheek.be  Fact Sheet for Healthcare Providers: GravelBags.it  This test is not yet approved or cleared by the [place] FDA and  has been authorized for detection and/or diagnosis of SARS-CoV-2 by  FDA under an Emergency Use Authorization (EUA). This EUA will remain  in effect (meaning this test can be used) for the duration of the  Covid-19 declaration under Section 564(b)(1) of the Act, 21  U.S.C. section 360bbb-3(b)(1), unless the authorization is  terminated or revoked. Performed at [facility], [place] [address] [place], [place] [address]   Blood Culture (routine x 2)     Status: None   Collection Time: 08/14/20 10:07 PM   Specimen: BLOOD  Result Value Ref Range Status   Specimen Description BLOOD RIGHT ANTECUBITAL  Final   Special Requests   Final    BOTTLES DRAWN AEROBIC AND ANAEROBIC Blood Culture adequate volume   Culture   Final    NO GROWTH 5 DAYS Performed at [facility], [place] [address] [place], [place] [address]    Report Status 08/19/2020 FINAL  Final  Blood Culture (routine x 2)     Status: None    Collection Time: 08/14/20 10:13 PM   Specimen: BLOOD  Result Value Ref Range Status   Specimen Description BLOOD LEFT ANTECUBITAL  Final   Special Requests   Final    BOTTLES DRAWN AEROBIC AND ANAEROBIC Blood Culture adequate volume   Culture   Final    NO GROWTH 5 DAYS Performed at [facility], [place] [address] [place], [place] [address]    Report Status 08/19/2020 FINAL  Final  MRSA PCR Screening     Status: None   Collection Time: 08/15/20  2:28 PM   Specimen: Urine, Clean Catch; Nasopharyngeal  Result Value Ref Range Status   MRSA by PCR NEGATIVE NEGATIVE Final    Comment:        The GeneXpert MRSA Assay (FDA approved for NASAL specimens only), is one component of a comprehensive MRSA colonization surveillance program. It is not intended to diagnose MRSA infection nor to guide or monitor treatment for MRSA infections. Performed at [facility], [place] [address], [place], [place] [address]      Scheduled Meds: . vitamin C  500 mg Oral Daily  . aspirin  81 mg Oral Daily  . atorvastatin  40 mg Oral Daily  . baricitinib  4 mg Oral Daily  . cholecalciferol  1,000 Units Oral Daily  . enoxaparin (LOVENOX) injection  40 mg Subcutaneous Q0600  . influenza vac split quadrivalent PF  0.5 mL Intramuscular Tomorrow-1000  . insulin aspart  0-20 Units Subcutaneous TID WC  . insulin aspart  0-5 Units Subcutaneous QHS  . insulin aspart  8 Units Subcutaneous TID WC  . insulin glargine  40 Units Subcutaneous Daily  . insulin starter kit- pen needles  1 kit Other Once  . living well with diabetes book   Does not apply Once  . multivitamin with minerals  1 tablet Oral Daily  . polyethylene glycol  17 g Oral BID  . predniSONE  60 mg Oral Q breakfast  . zinc  sulfate  220 mg Oral Daily     LOS: 5 days   Cherene Altes, MD [facility] Office  [phone] Pager - Text Page per Amion  If 7PM-7AM, please contact night-coverage per Amion 08/20/2020, 9:50  AM

## 2020-08-20 NOTE — Plan of Care (Signed)
Patient is currently resting in bed. Denies pain. VSS. Remains on [place] 25L. Voiding. Not OOB overnight. Turns self. Call bell within reach.   Problem: Coping: Goal: Psychosocial and spiritual needs will be supported Outcome: Progressing   Problem: Respiratory: Goal: Will maintain a patent airway Outcome: Progressing Goal: Complications related to the disease process, condition or treatment will be avoided or minimized Outcome: Progressing   Problem: Education: Goal: Knowledge of General Education information will improve Description: Including pain rating scale, medication(s)/side effects and non-pharmacologic comfort measures Outcome: Progressing   Problem: Health Behavior/Discharge Planning: Goal: Ability to manage health-related needs will improve Outcome: Progressing   Problem: Clinical Measurements: Goal: Ability to maintain clinical measurements within normal limits will improve Outcome: Progressing Goal: Will remain free from infection Outcome: Progressing Goal: Diagnostic test results will improve Outcome: Progressing Goal: Respiratory complications will improve Outcome: Progressing Goal: Cardiovascular complication will be avoided Outcome: Progressing   Problem: Activity: Goal: Risk for activity intolerance will decrease Outcome: Progressing   Problem: Nutrition: Goal: Adequate nutrition will be maintained Outcome: Progressing   Problem: Coping: Goal: Level of anxiety will decrease Outcome: Progressing   Problem: Elimination: Goal: Will not experience complications related to bowel motility Outcome: Progressing Goal: Will not experience complications related to urinary retention Outcome: Progressing   Problem: Pain Managment: Goal: General experience of comfort will improve Outcome: Progressing   Problem: Safety: Goal: Ability to remain free from injury will improve Outcome: Progressing   Problem: Skin Integrity: Goal: Risk for impaired skin  integrity will decrease Outcome: Progressing

## 2020-08-21 DIAGNOSIS — J1282 Pneumonia due to Coronavirus disease 2019: Secondary | ICD-10-CM | POA: Diagnosis not present

## 2020-08-21 DIAGNOSIS — U071 COVID-19: Secondary | ICD-10-CM | POA: Diagnosis not present

## 2020-08-21 LAB — CBC
HCT: 42.1 % (ref 39.0–52.0)
Hemoglobin: 14.5 g/dL (ref 13.0–17.0)
MCH: 31.2 pg (ref 26.0–34.0)
MCHC: 34.4 g/dL (ref 30.0–36.0)
MCV: 90.5 fL (ref 80.0–100.0)
Platelets: 367 10*3/uL (ref 150–400)
RBC: 4.65 MIL/uL (ref 4.22–5.81)
RDW: 12.6 % (ref 11.5–15.5)
WBC: 12.9 10*3/uL — ABNORMAL HIGH (ref 4.0–10.5)
nRBC: 0 % (ref 0.0–0.2)

## 2020-08-21 LAB — BASIC METABOLIC PANEL
Anion gap: 12 (ref 5–15)
BUN: 14 mg/dL (ref 6–20)
CO2: 23 mmol/L (ref 22–32)
Calcium: 8.3 mg/dL — ABNORMAL LOW (ref 8.9–10.3)
Chloride: 102 mmol/L (ref 98–111)
Creatinine, Ser: 0.66 mg/dL (ref 0.61–1.24)
GFR calc non Af Amer: >60 mL/min (ref >60)
Glucose, Bld: 54 mg/dL — ABNORMAL LOW (ref 70–99)
Potassium: 3.4 mmol/L — ABNORMAL LOW (ref 3.5–5.1)
Sodium: 137 mmol/L (ref 135–145)

## 2020-08-21 LAB — GLUCOSE, CAPILLARY
Glucose-Capillary: 65 mg/dL — ABNORMAL LOW (ref 70–99)
Glucose-Capillary: 93 mg/dL (ref 70–99)
Glucose-Capillary: 151 mg/dL — ABNORMAL HIGH (ref 70–99)
Glucose-Capillary: 323 mg/dL — ABNORMAL HIGH (ref 70–99)
Glucose-Capillary: 220 mg/dL — ABNORMAL HIGH (ref 70–99)

## 2020-08-21 LAB — D-DIMER, QUANTITATIVE: D-Dimer, Quant: 0.86 ug/mL-FEU — ABNORMAL HIGH (ref 0.00–0.50)

## 2020-08-21 LAB — MAGNESIUM: Magnesium: 1.9 mg/dL (ref 1.7–2.4)

## 2020-08-21 MED ORDER — POTASSIUM CHLORIDE CRYS ER 20 MEQ PO TBCR
40.0000 meq | EXTENDED_RELEASE_TABLET | Freq: Two times a day (BID) | ORAL | Status: AC
  Administered 2020-08-21 – 2020-08-22 (×3): 40 meq via ORAL
  Filled 2020-08-21 (×3): qty 2

## 2020-08-21 MED ORDER — INSULIN GLARGINE 100 UNIT/ML ~~LOC~~ SOLN
30.0000 [IU] | Freq: Every day | SUBCUTANEOUS | Status: DC
  Administered 2020-08-22 – 2020-08-30 (×9): 30 [IU] via SUBCUTANEOUS
  Filled 2020-08-21 (×9): qty 0.3

## 2020-08-21 MED ORDER — PREDNISONE 20 MG PO TABS
50.0000 mg | ORAL_TABLET | Freq: Every day | ORAL | Status: DC
  Administered 2020-08-22 – 2020-08-24 (×3): 50 mg via ORAL
  Filled 2020-08-21 (×4): qty 2

## 2020-08-21 NOTE — Progress Notes (Signed)
Joshua Bean  WUJ:[id number] DOB: July 15, 1976 DOA: 08/14/2020 PCP: Jearld Fenton, NP    Brief Narrative:  44 year old with a history of anxiety, HLD, and DM two who presented to the ED with severe shortness of breath following a known Covid exposure. He was found to have a saturation of 84% on room air and was tachycardic. He stated he had been sick for approximately 1 week with fever, headache, nausea, and poor intake. In the ED he was found to have a temperature of 102.9 and was tachycardic. SARS-CoV-2 PCR was positive.  Significant Events:  10/1 admit via ED  Date of Positive COVID Test:  08/14/2020  Vaccination Status: Not vaccinated against Covid  COVID-19 specific Treatment: Steroid 10/1 > Remdesivir 10/2 >10/6 Baricitinib 10/2 >  Antimicrobials:  Azithromycin 10/1 Levaquin 10/3 >  DVT prophylaxis: Lovenox  Subjective: Recurrent intermittent hypotension has been noted throughout the patient's entire hospital stay.  He is however young with no cardiovascular history and is entirely asymptomatic.  It is felt that he likely simply has a low baseline blood pressure.  He is afebrile.  He experiences intermittent tachypnea but not to extremities.  Some progress has been made in the weaning of his oxygen support with his high flow nasal cannula being decreased from 60% to 45% and 20 L of flow.  Feels appreciably less short of breath today.  Assessment & Plan:  COVID Pneumonia -acute hypoxic respiratory failure - sepsis ruled out  Being treated with steroid, baricitinib -has completed a course of remdesivir -wean oxygen as able but continues to require high flow nasal cannula support - making very slow progress at this time   Recent Labs  Lab 08/14/20 2157 08/15/20 0315 08/16/20 0619 08/16/20 0619 08/17/20 0152 08/18/20 0143 08/19/20 0717 08/20/20 0300 08/21/20 0514  DDIMER 2.35*  --  0.84*   < > 0.71* 0.80* 1.16* 1.04* 0.86*  FERRITIN 3,479*  --   --   --   --   --    --   --   --   CRP 20.5*  --  7.8*  --  2.5* 1.6* 0.7 0.7  --   ALT 41  --  31  --  30 32 28 27  --   PROCALCITON 0.51   < > 0.54  --  0.32 0.14 <0.10 <0.10  --    < > = values in this interval not displayed.    Mild transaminitis Likely due to Covid itself with possible contribution from Remdesivir -resolved  Uncontrolled DM2 -severe hyperglycemia CBG much improved at this time, with intermittent episodes of hypoglycemia -insulin regimen therefore adjusted again  Hypokalemia Magnesium is normal -improving with supplementation -dose with additional potassium today  Acute kidney injury  crt 1.25 at presentation - resolved w/ volume expansion -creatinine presently normal  HLD Continue usual statin   Anxiety/depression  Code Status: FULL CODE Family Communication:  Status is: Inpatient  Remains inpatient appropriate because:Inpatient level of care appropriate due to severity of illness   Dispo: The patient is from: Home              Anticipated d/c is to: Home              Anticipated d/c date is: 3 days              Patient currently is not medically stable to d/c.   Consultants:  none  Objective: Blood pressure (!) 91/45, pulse 98, temperature 99 F (37.2 C),  temperature source Oral, resp. rate 20, height _0  (1.803 m), weight 95.3 kg, SpO2 95 %.  Intake/Output Summary (Last 24 hours) at 08/21/2020 1008 Last data filed at 08/21/2020 0834 Gross per 24 hour  Intake 560 ml  Output --  Net 560 ml   Filed Weights   08/14/20 2231  Weight: 95.3 kg    Examination: General: Not in extremis  Lungs: Fine crackles diffusely bilateral fields -no wheeze Cardiovascular: RRR -no murmur Abdomen: NT/ND, soft, BS positive, no rebound Extremities: No C/C/E B LE   CBC: Recent Labs  Lab 08/18/20 0143 08/18/20 0143 08/19/20 0717 08/20/20 0300 08/21/20 0515  WBC 11.1*   < > 14.3* 16.9* 12.9*  NEUTROABS 10.3*  --  11.9* 13.5*  --   HGB 13.9   < > 14.1 14.5 14.5  HCT  40.2   < > 41.2 42.4 42.1  MCV 91.6   < > 90.7 89.5 90.5  PLT 247   < > 299 346 367   < > = values in this interval not displayed.   Basic Metabolic Panel: Recent Labs  Lab 08/19/20 0717 08/20/20 0300 08/21/20 0514 08/21/20 0515  NA 139 138  --  137  K 4.0 3.1*  --  3.4*  CL 100 102  --  102  CO2 24 24  --  23  GLUCOSE 133* 68*  --  54*  BUN 21* 18  --  14  CREATININE 0.67 0.60*  --  0.66  CALCIUM 8.4* 8.3*  --  8.3*  MG 2.0 1.9 1.9  --    GFR: Estimated Creatinine Clearance: 140.3 mL/min (by C-G formula based on SCr of 0.66 mg/dL).  Liver Function Tests: Recent Labs  Lab 08/17/20 0152 08/18/20 0143 08/19/20 0717 08/20/20 0300  AST 38 32 25 21  ALT 30 32 28 27  ALKPHOS 56 64 69 67  BILITOT 0.7 0.6 0.8 1.0  PROT 5.6* 5.2* 5.5* 5.4*  ALBUMIN 2.3* 2.2* 2.4* 2.4*    HbA1C: Hgb A1c MFr Bld  Date/Time Value Ref Range Status  08/15/2020 03:15 AM 10.8 (H) 4.8 - 5.6 % Final    Comment:    (NOTE) Pre diabetes:          5.7%-6.4%  Diabetes:              >6.4%  Glycemic control for   <7.0% adults with diabetes   06/04/2020 12:52 PM 11.1 (H) 4.8 - 5.6 % Final    Comment:             Prediabetes: 5.7 - 6.4          Diabetes: >6.4          Glycemic control for adults with diabetes: <7.0     CBG: Recent Labs  Lab 08/20/20 1159 08/20/20 1606 08/20/20 2041 08/21/20 0744 08/21/20 0819  GLUCAP 120* 126* 102* 65* 93    Recent Results (from the past 240 hour(s))  Respiratory Panel by RT PCR (Flu A&B, Covid) - Nasopharyngeal Swab     Status: Abnormal   Collection Time: 08/14/20 10:07 PM   Specimen: Nasopharyngeal Swab  Result Value Ref Range Status   SARS Coronavirus 2 by RT PCR POSITIVE (A) NEGATIVE Final    Comment: RESULT CALLED TO, READ BACK BY AND VERIFIED WITH: Aileen Pilot MD 08/14/20 AT 2315 SK  (NOTE) SARS-CoV-2 target nucleic acids are DETECTED.  SARS-CoV-2 RNA is generally detectable in upper respiratory specimens  during the acute phase of  infection.  Positive results are indicative of the presence of the identified virus, but do not rule out bacterial infection or co-infection with other pathogens not detected by the test. Clinical correlation with patient history and other diagnostic information is necessary to determine patient infection status. The expected result is Negative.  Fact Sheet for Patients:  PinkCheek.be  Fact Sheet for Healthcare Providers: GravelBags.it  This test is not yet approved or cleared by the [place] FDA and  has been authorized for detection and/or diagnosis of SARS-CoV-2 by FDA under an Emergency Use Authorization (EUA).  This EUA will remain in effect (meaning this test can be  used) for the duration of  the COVID-19 declaration under Section 564(b)(1) of the Act, 21 U.S.C. section 360bbb-3(b)(1), unless the authorization is terminated or revoked sooner.      Influenza A by PCR NEGATIVE NEGATIVE Final   Influenza B by PCR NEGATIVE NEGATIVE Final    Comment: (NOTE) The Xpert Xpress SARS-CoV-2/FLU/RSV assay is intended as an aid in  the diagnosis of influenza from Nasopharyngeal swab specimens and  should not be used as a sole basis for treatment. Nasal washings and  aspirates are unacceptable for Xpert Xpress SARS-CoV-2/FLU/RSV  testing.  Fact Sheet for Patients: PinkCheek.be  Fact Sheet for Healthcare Providers: GravelBags.it  This test is not yet approved or cleared by the [place] FDA and  has been authorized for detection and/or diagnosis of SARS-CoV-2 by  FDA under an Emergency Use Authorization (EUA). This EUA will remain  in effect (meaning this test can be used) for the duration of the  Covid-19 declaration under Section 564(b)(1) of the Act, 21  U.S.C. section 360bbb-3(b)(1), unless the authorization is  terminated or revoked. Performed at [facility], [place] [address] [place], [place] [address]   Blood Culture (routine x 2)     Status: None   Collection Time: 08/14/20 10:07 PM   Specimen: BLOOD  Result Value Ref Range Status   Specimen Description BLOOD RIGHT ANTECUBITAL  Final   Special Requests   Final    BOTTLES DRAWN AEROBIC AND ANAEROBIC Blood Culture adequate volume   Culture   Final    NO GROWTH 5 DAYS Performed at [facility], [place] [address], [place], [place] [address]    Report Status 08/19/2020 FINAL  Final  Blood Culture (routine x 2)     Status: None   Collection Time: 08/14/20 10:13 PM   Specimen: BLOOD  Result Value Ref Range Status   Specimen Description BLOOD LEFT ANTECUBITAL  Final   Special Requests   Final    BOTTLES DRAWN AEROBIC AND ANAEROBIC Blood Culture adequate volume   Culture   Final    NO GROWTH 5 DAYS Performed at [facility], [place] [address], [place], [place] [address]    Report Status 08/19/2020 FINAL  Final  MRSA PCR Screening     Status: None   Collection Time: 08/15/20  2:28 PM   Specimen: Urine, Clean Catch; Nasopharyngeal  Result Value Ref Range Status   MRSA by PCR NEGATIVE NEGATIVE Final    Comment:        The GeneXpert MRSA Assay (FDA approved for NASAL specimens only), is one component of a comprehensive MRSA colonization surveillance program. It is not intended to diagnose MRSA infection nor to guide or monitor treatment for MRSA infections. Performed at [facility], [place] [address] [place], [place] [address]      Scheduled Meds: .  vitamin C  500 mg Oral Daily  . aspirin  81 mg Oral Daily  . atorvastatin  40 mg Oral Daily  . baricitinib  4 mg Oral Daily  . cholecalciferol  1,000 Units Oral Daily  . enoxaparin (LOVENOX) injection  40 mg Subcutaneous Q0600  . influenza vac split quadrivalent PF  0.5 mL Intramuscular Tomorrow-1000  . insulin aspart  0-20 Units Subcutaneous TID WC  . insulin aspart  0-5 Units Subcutaneous QHS  . insulin  glargine  40 Units Subcutaneous Daily  . insulin starter kit- pen needles  1 kit Other Once  . living well with diabetes book   Does not apply Once  . multivitamin with minerals  1 tablet Oral Daily  . polyethylene glycol  17 g Oral BID  . predniSONE  60 mg Oral Q breakfast  . zinc sulfate  220 mg Oral Daily     LOS: 6 days   Cherene Altes, MD [facility] Office  [phone] Pager - Text Page per Amion  If 7PM-7AM, please contact night-coverage per Amion 08/21/2020, 10:08 AM

## 2020-08-21 NOTE — Plan of Care (Signed)
Patient is currently resting in bed. Denies pain. VSS. BP soft, provider made aware this AM. Bolus given in the evening. Oxygen increased to 20L, 60% on HHFNC d/t desaturation. [place] standby. Given PRN xanax d/t increase anxiety. Call bell within reach. Frequently checked on .   Provider remains in [place]. Charge RN made aware, provider aware.   Problem: Coping: Goal: Psychosocial and spiritual needs will be supported Outcome: Progressing   Problem: Respiratory: Goal: Will maintain a patent airway Outcome: Progressing Goal: Complications related to the disease process, condition or treatment will be avoided or minimized Outcome: Progressing   Problem: Education: Goal: Knowledge of General Education information will improve Description: Including pain rating scale, medication(s)/side effects and non-pharmacologic comfort measures Outcome: Progressing   Problem: Health Behavior/Discharge Planning: Goal: Ability to manage health-related needs will improve Outcome: Progressing   Problem: Clinical Measurements: Goal: Ability to maintain clinical measurements within normal limits will improve Outcome: Progressing Goal: Will remain free from infection Outcome: Progressing Goal: Diagnostic test results will improve Outcome: Progressing Goal: Respiratory complications will improve Outcome: Progressing Goal: Cardiovascular complication will be avoided Outcome: Progressing   Problem: Activity: Goal: Risk for activity intolerance will decrease Outcome: Progressing   Problem: Nutrition: Goal: Adequate nutrition will be maintained Outcome: Progressing   Problem: Coping: Goal: Level of anxiety will decrease Outcome: Progressing   Problem: Elimination: Goal: Will not experience complications related to bowel motility Outcome: Progressing Goal: Will not experience complications related to urinary retention Outcome: Progressing   Problem: Pain Managment: Goal: General  experience of comfort will improve Outcome: Progressing   Problem: Safety: Goal: Ability to remain free from injury will improve Outcome: Progressing   Problem: Skin Integrity: Goal: Risk for impaired skin integrity will decrease Outcome: Progressing

## 2020-08-21 NOTE — Progress Notes (Signed)
Inpatient Diabetes Program Recommendations  AACE/ADA: New Consensus Statement on Inpatient Glycemic Control (2015)  Target Ranges:  Prepandial:   less than 140 mg/dL      Peak postprandial:   less than 180 mg/dL (1-2 hours)      Critically ill patients:  140 - 180 mg/dL   Lab Results  Component Value Date   GLUCAP 151 (H) 08/21/2020   HGBA1C 10.8 (H) 08/15/2020    Review of Glycemic Control  Diabetes history: type 2 Outpatient Diabetes medications: none Current orders for Inpatient glycemic control: Lantus 30 units daily, Novolog RESIST TID & HS scale  Inpatient Diabetes Program Recommendations:   Spoke with patient on the phone about his diabetes. Could not remember when he was diagnosed, but stated that he was not on any medication. Only following an exercise plan. States that he does have a home blood glucose meter and strips at home. States that he has not been checking his blood sugars lately.  Recommended that he practice giving himself insulin with the help of the staff RN in case he will need to be discharged on insulin. He agreed and will be willing to do this.   Will continue to monitor blood sugars while in the hospital.  Harvel Ricks RN BSN CDE Diabetes Coordinator Pager: [phone]  8am-5pm

## 2020-08-21 NOTE — Progress Notes (Signed)
Physical Therapy Treatment Patient Details Name: KAMDYN CATINO MRN: [id number] DOB: [birth year] Today's Date: 08/21/2020    History of Present Illness Joshua Bean is a 44 y.o. male with medical history significant of anxiety, hyperlipidemia, type 2 diabetes not on any medications presenting with a chief complaint of shortness of breath.  He did have a known Covid exposure.  Satting 84% on room air with EMS, improved to 90% with 6 L supplemental oxygen.  Tachycardic with heart rate in the 110s.  Tachypneic with respiratory rate in the 20s.  Patient states he has not been vaccinated against Covid.    PT Comments    Pt on 10 L HFNC on entry with SaO2 with SaO2 96%O2. Pt very eager to get out of bed and needed to be reminded to stay seated while lines and leads organized for safety. Pt with decreased awareness of his deficits and safety in presence of decreased strength and balance. Pt is mod I for bed mobility, and transfers and starts ambulation with min guard however experiences 2x LoB requiring modA for steadying. Pt will benefit from HHPT for strengthening and balance. Will trial with RW next session to see if improves safety with mobility. PT will continue to follow acutely.    Follow Up Recommendations  Home health PT     Equipment Recommendations  None recommended by PT;Other (comment) (TBD, dont anticipate needing anything)       Precautions / Restrictions Precautions Precautions: Fall Precaution Comments: mod fall Restrictions Weight Bearing Restrictions: No    Mobility  Bed Mobility Overal bed mobility: Modified Independent                Transfers Overall transfer level: Modified independent Equipment used: None                Ambulation/Gait Ambulation/Gait assistance: Min guard;Mod assist Gait Distance (Feet): 40 Feet Assistive device: None Gait Pattern/deviations: Step-through pattern;Decreased step length - right;Decreased step length - left Gait  velocity: decr   General Gait Details: ambulation limited to room due to 10L HFNC, initially with min guard assist however x2 LoB requiring mod A to steady, pt reports balance is poor at baseling         Balance Overall balance assessment: Needs assistance Sitting-balance support: Feet supported Sitting balance-Leahy Scale: Good     Standing balance support: During functional activity Standing balance-Leahy Scale: Fair Standing balance comment: dynamic balance impaired                            Cognition Arousal/Alertness: Awake/alert Behavior During Therapy: Impulsive Overall Cognitive Status: History of cognitive impairments - at baseline                                 General Comments: pt had to be told 3 times to remain seated until therapist could organize lines and leads for safe mobility, pt with decreased awareness of his deficits and safety throughout session       Exercises General Exercises - Lower Extremity Ankle Circles/Pumps: AROM;Both;10 reps;Seated Long Arc Quad: AROM;Both;10 reps;Seated Hip ABduction/ADduction: AROM;Both;10 reps;Seated Hip Flexion/Marching: AROM;Both;10 reps;Seated    General Comments General comments (skin integrity, edema, etc.): Pt on 10 L HFNC on entry with SaO2 96%O2, SaO2 remained >88%O2 throughout session,       Pertinent Vitals/Pain Pain Assessment: No/denies pain  PT Goals (current goals can now be found in the care plan section) Acute Rehab PT Goals Patient Stated Goal: to get better PT Goal Formulation: With patient Time For Goal Achievement: 08/31/20 Potential to Achieve Goals: Good Progress towards PT goals: Progressing toward goals    Frequency    Min 3X/week      PT Plan Current plan remains appropriate       AM-PAC PT "6 Clicks" Mobility   Outcome Measure  Help needed turning from your back to your side while in a flat bed without using bedrails?: A Little Help  needed moving from lying on your back to sitting on the side of a flat bed without using bedrails?: A Little Help needed moving to and from a bed to a chair (including a wheelchair)?: A Little Help needed standing up from a chair using your arms (e.g., wheelchair or bedside chair)?: A Little Help needed to walk in hospital room?: A Little Help needed climbing 3-5 steps with a railing? : A Lot 6 Click Score: 17    End of Session Equipment Utilized During Treatment: Oxygen Activity Tolerance: Patient limited by fatigue Patient left: in bed;with call bell/phone within reach Nurse Communication: Mobility status PT Visit Diagnosis: Unsteadiness on feet (R26.81);Muscle weakness (generalized) (M62.81);Difficulty in walking, not elsewhere classified (R26.2)     Time: 5284-1324 PT Time Calculation (min) (ACUTE ONLY): 19 min  Charges:  $Therapeutic Exercise: 8-22 mins                     Kaimana Neuzil B. Beverely Risen PT, DPT Acute Rehabilitation Services Pager [phone] Office [phone]    Elon Alas Fleet 08/21/2020, 4:54 PM

## 2020-08-21 NOTE — Progress Notes (Signed)
Dr. Cyd Silence made aware of patients hypotension this AM. Pt aysmp. HR controlled.   Provider stated to continue to monitor and notify him if MAP is <65, or patient becomes symptomatic or decrease in urine production  Patient currently resting in bed. Call bell within reach.

## 2020-08-21 NOTE — Progress Notes (Signed)
Hypoglycemic Event  CBG: 65  Treatment: Two 4oz orange juice  Symptoms: Asymptomatic  Follow-up CBG: [id number] CBG Result:93  Possible Reasons for Event: poor intake  Comments/MD notified: Order to hold AM meal coverage. Dr. Thereasa Solo.    Joshua Bean  Joshua Bean

## 2020-08-22 DIAGNOSIS — J1282 Pneumonia due to Coronavirus disease 2019: Secondary | ICD-10-CM | POA: Diagnosis not present

## 2020-08-22 DIAGNOSIS — U071 COVID-19: Secondary | ICD-10-CM | POA: Diagnosis not present

## 2020-08-22 LAB — BASIC METABOLIC PANEL
Anion gap: 10 (ref 5–15)
BUN: 16 mg/dL (ref 6–20)
CO2: 24 mmol/L (ref 22–32)
Calcium: 8.2 mg/dL — ABNORMAL LOW (ref 8.9–10.3)
Chloride: 103 mmol/L (ref 98–111)
Creatinine, Ser: 0.6 mg/dL — ABNORMAL LOW (ref 0.61–1.24)
GFR, Estimated: >60 mL/min (ref >60)
Glucose, Bld: 60 mg/dL — ABNORMAL LOW (ref 70–99)
Potassium: 3.9 mmol/L (ref 3.5–5.1)
Sodium: 137 mmol/L (ref 135–145)

## 2020-08-22 LAB — GLUCOSE, CAPILLARY
Glucose-Capillary: 141 mg/dL — ABNORMAL HIGH (ref 70–99)
Glucose-Capillary: 231 mg/dL — ABNORMAL HIGH (ref 70–99)
Glucose-Capillary: 212 mg/dL — ABNORMAL HIGH (ref 70–99)
Glucose-Capillary: 203 mg/dL — ABNORMAL HIGH (ref 70–99)

## 2020-08-22 MED ORDER — LACTATED RINGERS IV BOLUS
1000.0000 mL | Freq: Once | INTRAVENOUS | Status: AC
  Administered 2020-08-22: 1000 mL via INTRAVENOUS

## 2020-08-22 MED ORDER — ALPRAZOLAM 0.5 MG PO TABS
0.5000 mg | ORAL_TABLET | Freq: Three times a day (TID) | ORAL | Status: DC | PRN
  Administered 2020-08-22 – 2020-08-29 (×7): 0.5 mg via ORAL
  Filled 2020-08-22 (×8): qty 1

## 2020-08-22 NOTE — Progress Notes (Signed)
HOSPITAL MEDICINE OVERNIGHT EVENT NOTE    Notified by nursing the patient is exhibiting substantial hypotension with last blood pressure 86/55 with map of 65.  Patient denies lightheadedness but patient has not urinated in at least the past 7 hours.  Chart reviewed, patient has exhibited hypotension essentially throughout the hospitalization although this blood pressure is slightly lower than his average.  No past medical history of adrenal insufficiency or congestive heart failure.  No cortisol or echocardiogram on file although patient did have a BNP earlier in the hospitalization that was unremarkable.  Meds reviewed, patient is not on any antihypertensives.  Per nursing, no obvious evidence of progressive infection other than ongoing hypoxia from known Covid.  We will provide patient with a 1 L bolus of LR.  Continue to monitor blood pressures.  Vernelle Emerald  MD [facility]

## 2020-08-22 NOTE — Progress Notes (Signed)
Joshua Bean  HQP:[id number] DOB: 22-Jun-1976 DOA: 08/14/2020 PCP: Jearld Fenton, NP    Brief Narrative:  44 year old with a history of anxiety, HLD, and DM two who presented to the ED with severe shortness of breath following a known Covid exposure. He was found to have a saturation of 84% on room air and was tachycardic. He stated he had been sick for approximately 1 week with fever, headache, nausea, and poor intake. In the ED he was found to have a temperature of 102.9 and was tachycardic. SARS-CoV-2 PCR was positive.  Significant Events:  10/1 admit via ED  Date of Positive COVID Test:  08/14/2020  Vaccination Status: Not vaccinated against Covid  COVID-19 specific Treatment: Steroid 10/1 > Remdesivir 10/2 >10/6 Baricitinib 10/2 >  Antimicrobials:  Azithromycin 10/1 Levaquin 10/3 >  DVT prophylaxis: Lovenox  Subjective: Has been successfully weaned from heated high flow nasal cannula to salter high flow at 10 L.  Low blood pressure persist but this is felt to be his baseline and he is entirely asymptomatic. No new complaints today.   Assessment & Plan:  COVID Pneumonia -acute hypoxic respiratory failure - sepsis ruled out  Being treated with steroid and baricitinib - has completed a course of remdesivir - continue to wean oxygen as able with patient making significant progress over the last 24 hours with transition to salter high flow- making progress at this time   Mild transaminitis Likely due to Covid itself with possible contribution from Remdesivir -resolved  Uncontrolled DM2 - severe hyperglycemia CBG now much more stable - follow trend  Hypokalemia Corrected with supplementation  Acute kidney injury  crt 1.25 at presentation - resolved w/ volume expansion -creatinine presently normal  HLD Continue usual statin   Anxiety/depression  Code Status: FULL CODE Family Communication:  Status is: Inpatient  Remains inpatient appropriate because:Inpatient  level of care appropriate due to severity of illness   Dispo: The patient is from: Home              Anticipated d/c is to: Home              Anticipated d/c date is: 3 days              Patient currently is not medically stable to d/c.   Consultants:  none  Objective: Blood pressure (!) 94/51, pulse 94, temperature 98.7 F (37.1 C), temperature source Oral, resp. rate 20, height _0  (1.803 m), weight 95.3 kg, SpO2 93 %.  Intake/Output Summary (Last 24 hours) at 08/22/2020 1002 Last data filed at 08/22/2020 0946 Gross per 24 hour  Intake 1000 ml  Output 1000 ml  Net 0 ml   Filed Weights   08/14/20 2231  Weight: 95.3 kg    Examination: General: NAD Lungs: Fine crackles diffusely bilateral fields w/ improved air movement - no wheezing  Cardiovascular: RRR -no murmur Abdomen: NT/ND, soft, BS positive, no rebound Extremities: No C/C/E B lower extremities   CBC: Recent Labs  Lab 08/18/20 0143 08/18/20 0143 08/19/20 0717 08/20/20 0300 08/21/20 0515  WBC 11.1*   < > 14.3* 16.9* 12.9*  NEUTROABS 10.3*  --  11.9* 13.5*  --   HGB 13.9   < > 14.1 14.5 14.5  HCT 40.2   < > 41.2 42.4 42.1  MCV 91.6   < > 90.7 89.5 90.5  PLT 247   < > 299 346 367   < > = values in this interval not displayed.  Basic Metabolic Panel: Recent Labs  Lab 08/19/20 0717 08/19/20 0717 08/20/20 0300 08/21/20 0514 08/21/20 0515 08/22/20 0409  NA 139   < > 138  --  137 137  K 4.0   < > 3.1*  --  3.4* 3.9  CL 100   < > 102  --  102 103  CO2 24   < > 24  --  23 24  GLUCOSE 133*   < > 68*  --  54* 60*  BUN 21*   < > 18  --  14 16  CREATININE 0.67   < > 0.60*  --  0.66 0.60*  CALCIUM 8.4*   < > 8.3*  --  8.3* 8.2*  MG 2.0  --  1.9 1.9  --   --    < > = values in this interval not displayed.   GFR: Estimated Creatinine Clearance: 140.3 mL/min (A) (by C-G formula based on SCr of 0.6 mg/dL (L)).  Liver Function Tests: Recent Labs  Lab 08/17/20 0152 08/18/20 0143 08/19/20 0717 08/20/20  0300  AST 38 32 25 21  ALT 30 32 28 27  ALKPHOS 56 64 69 67  BILITOT 0.7 0.6 0.8 1.0  PROT 5.6* 5.2* 5.5* 5.4*  ALBUMIN 2.3* 2.2* 2.4* 2.4*    HbA1C: Hgb A1c MFr Bld  Date/Time Value Ref Range Status  08/15/2020 03:15 AM 10.8 (H) 4.8 - 5.6 % Final    Comment:    (NOTE) Pre diabetes:          5.7%-6.4%  Diabetes:              >6.4%  Glycemic control for   <7.0% adults with diabetes   06/04/2020 12:52 PM 11.1 (H) 4.8 - 5.6 % Final    Comment:             Prediabetes: 5.7 - 6.4          Diabetes: >6.4          Glycemic control for adults with diabetes: <7.0     CBG: Recent Labs  Lab 08/21/20 0819 08/21/20 1208 08/21/20 1613 08/21/20 2119 08/22/20 0744  GLUCAP 93 151* 323* 220* 141*    Recent Results (from the past 240 hour(s))  Respiratory Panel by RT PCR (Flu A&B, Covid) - Nasopharyngeal Swab     Status: Abnormal   Collection Time: 08/14/20 10:07 PM   Specimen: Nasopharyngeal Swab  Result Value Ref Range Status   SARS Coronavirus 2 by RT PCR POSITIVE (A) NEGATIVE Final    Comment: RESULT CALLED TO, READ BACK BY AND VERIFIED WITH: Aileen Pilot MD 08/14/20 AT 2315 SK  (NOTE) SARS-CoV-2 target nucleic acids are DETECTED.  SARS-CoV-2 RNA is generally detectable in upper respiratory specimens  during the acute phase of infection. Positive results are indicative of the presence of the identified virus, but do not rule out bacterial infection or co-infection with other pathogens not detected by the test. Clinical correlation with patient history and other diagnostic information is necessary to determine patient infection status. The expected result is Negative.  Fact Sheet for Patients:  PinkCheek.be  Fact Sheet for Healthcare Providers: GravelBags.it  This test is not yet approved or cleared by the [place] FDA and  has been authorized for detection and/or diagnosis of SARS-CoV-2 by FDA under an  Emergency Use Authorization (EUA).  This EUA will remain in effect (meaning this test can be  used) for the duration of  the COVID-19 declaration under Section  564(b)(1) of the Act, 21 U.S.C. section 360bbb-3(b)(1), unless the authorization is terminated or revoked sooner.      Influenza A by PCR NEGATIVE NEGATIVE Final   Influenza B by PCR NEGATIVE NEGATIVE Final    Comment: (NOTE) The Xpert Xpress SARS-CoV-2/FLU/RSV assay is intended as an aid in  the diagnosis of influenza from Nasopharyngeal swab specimens and  should not be used as a sole basis for treatment. Nasal washings and  aspirates are unacceptable for Xpert Xpress SARS-CoV-2/FLU/RSV  testing.  Fact Sheet for Patients: PinkCheek.be  Fact Sheet for Healthcare Providers: GravelBags.it  This test is not yet approved or cleared by the [place] FDA and  has been authorized for detection and/or diagnosis of SARS-CoV-2 by  FDA under an Emergency Use Authorization (EUA). This EUA will remain  in effect (meaning this test can be used) for the duration of the  Covid-19 declaration under Section 564(b)(1) of the Act, 21  U.S.C. section 360bbb-3(b)(1), unless the authorization is  terminated or revoked. Performed at [facility], [place] [address], [place], [place] [address]   Blood Culture (routine x 2)     Status: None   Collection Time: 08/14/20 10:07 PM   Specimen: BLOOD  Result Value Ref Range Status   Specimen Description BLOOD RIGHT ANTECUBITAL  Final   Special Requests   Final    BOTTLES DRAWN AEROBIC AND ANAEROBIC Blood Culture adequate volume   Culture   Final    NO GROWTH 5 DAYS Performed at [facility], [place] [address], [place], [place] [address]    Report Status 08/19/2020 FINAL  Final  Blood Culture (routine x 2)     Status: None   Collection Time: 08/14/20 10:13 PM   Specimen: BLOOD  Result Value Ref Range Status   Specimen  Description BLOOD LEFT ANTECUBITAL  Final   Special Requests   Final    BOTTLES DRAWN AEROBIC AND ANAEROBIC Blood Culture adequate volume   Culture   Final    NO GROWTH 5 DAYS Performed at [facility], [place] [address] [place], [place] [address]    Report Status 08/19/2020 FINAL  Final  MRSA PCR Screening     Status: None   Collection Time: 08/15/20  2:28 PM   Specimen: Urine, Clean Catch; Nasopharyngeal  Result Value Ref Range Status   MRSA by PCR NEGATIVE NEGATIVE Final    Comment:        The GeneXpert MRSA Assay (FDA approved for NASAL specimens only), is one component of a comprehensive MRSA colonization surveillance program. It is not intended to diagnose MRSA infection nor to guide or monitor treatment for MRSA infections. Performed at [facility], [place] [address], [place], [place] [address]      Scheduled Meds: . vitamin C  500 mg Oral Daily  . aspirin  81 mg Oral Daily  . atorvastatin  40 mg Oral Daily  . baricitinib  4 mg Oral Daily  . cholecalciferol  1,000 Units Oral Daily  . enoxaparin (LOVENOX) injection  40 mg Subcutaneous Q0600  . insulin aspart  0-20 Units Subcutaneous TID WC  . insulin aspart  0-5 Units Subcutaneous QHS  . insulin glargine  30 Units Subcutaneous Daily  . insulin starter kit- pen needles  1 kit Other Once  . living well with diabetes book   Does not apply Once  . multivitamin with minerals  1 tablet Oral Daily  . polyethylene glycol  17 g  Oral BID  . predniSONE  50 mg Oral Q breakfast  . zinc sulfate  220 mg Oral Daily     LOS: 7 days   Cherene Altes, MD [facility] Office  [phone] Pager - Text Page per Amion  If 7PM-7AM, please contact night-coverage per Amion 08/22/2020, 10:02 AM

## 2020-08-23 DIAGNOSIS — U071 COVID-19: Secondary | ICD-10-CM | POA: Diagnosis not present

## 2020-08-23 DIAGNOSIS — J1282 Pneumonia due to Coronavirus disease 2019: Secondary | ICD-10-CM | POA: Diagnosis not present

## 2020-08-23 LAB — COMPREHENSIVE METABOLIC PANEL
ALT: 20 U/L (ref 0–44)
AST: 17 U/L (ref 15–41)
Albumin: 2.3 g/dL — ABNORMAL LOW (ref 3.5–5.0)
Alkaline Phosphatase: 68 U/L (ref 38–126)
Anion gap: 12 (ref 5–15)
BUN: 12 mg/dL (ref 6–20)
CO2: 23 mmol/L (ref 22–32)
Calcium: 8.5 mg/dL — ABNORMAL LOW (ref 8.9–10.3)
Chloride: 101 mmol/L (ref 98–111)
Creatinine, Ser: 0.68 mg/dL (ref 0.61–1.24)
GFR, Estimated: >60 mL/min (ref >60)
Glucose, Bld: 106 mg/dL — ABNORMAL HIGH (ref 70–99)
Potassium: 3.7 mmol/L (ref 3.5–5.1)
Sodium: 136 mmol/L (ref 135–145)
Total Bilirubin: 0.5 mg/dL (ref 0.3–1.2)
Total Protein: 5.5 g/dL — ABNORMAL LOW (ref 6.5–8.1)

## 2020-08-23 LAB — CBC
HCT: 41.6 % (ref 39.0–52.0)
Hemoglobin: 14.5 g/dL (ref 13.0–17.0)
MCH: 31.2 pg (ref 26.0–34.0)
MCHC: 34.9 g/dL (ref 30.0–36.0)
MCV: 89.5 fL (ref 80.0–100.0)
Platelets: 405 10*3/uL — ABNORMAL HIGH (ref 150–400)
RBC: 4.65 MIL/uL (ref 4.22–5.81)
RDW: 12.5 % (ref 11.5–15.5)
WBC: 11.3 10*3/uL — ABNORMAL HIGH (ref 4.0–10.5)
nRBC: 0 % (ref 0.0–0.2)

## 2020-08-23 LAB — GLUCOSE, CAPILLARY
Glucose-Capillary: 74 mg/dL (ref 70–99)
Glucose-Capillary: 140 mg/dL — ABNORMAL HIGH (ref 70–99)
Glucose-Capillary: 288 mg/dL — ABNORMAL HIGH (ref 70–99)
Glucose-Capillary: 319 mg/dL — ABNORMAL HIGH (ref 70–99)

## 2020-08-23 LAB — CORTISOL: Cortisol, Plasma: 2 ug/dL

## 2020-08-23 MED ORDER — SODIUM CHLORIDE 0.9 % IV BOLUS
500.0000 mL | Freq: Once | INTRAVENOUS | Status: AC
  Administered 2020-08-23: 500 mL via INTRAVENOUS

## 2020-08-23 NOTE — Plan of Care (Signed)
Patient is currently resting in bed. Denies pain. VSS. BP low this AM, 500cc bolus given. Patient is currently on 10L HFNC.   During a shower last evening, patient decided it was best to take off oxygen during the whole shower. When patient got back in bed, patient was SOB, labored breathing, anxious. HFNC increased to 15L, inhaler given and once patient calmed down cough medicine given. Patient has been weaned back down to 10L. Beginning of shift patient started on 8L. Call bell within reach.   Problem: Coping: Goal: Psychosocial and spiritual needs will be supported Outcome: Progressing   Problem: Respiratory: Goal: Will maintain a patent airway Outcome: Progressing Goal: Complications related to the disease process, condition or treatment will be avoided or minimized Outcome: Progressing   Problem: Education: Goal: Knowledge of General Education information will improve Description: Including pain rating scale, medication(s)/side effects and non-pharmacologic comfort measures Outcome: Progressing   Problem: Health Behavior/Discharge Planning: Goal: Ability to manage health-related needs will improve Outcome: Progressing   Problem: Clinical Measurements: Goal: Ability to maintain clinical measurements within normal limits will improve Outcome: Progressing Goal: Will remain free from infection Outcome: Progressing Goal: Diagnostic test results will improve Outcome: Progressing Goal: Respiratory complications will improve Outcome: Progressing Goal: Cardiovascular complication will be avoided Outcome: Progressing   Problem: Activity: Goal: Risk for activity intolerance will decrease Outcome: Progressing   Problem: Nutrition: Goal: Adequate nutrition will be maintained Outcome: Progressing   Problem: Coping: Goal: Level of anxiety will decrease Outcome: Progressing   Problem: Elimination: Goal: Will not experience complications related to bowel motility Outcome:  Progressing Goal: Will not experience complications related to urinary retention Outcome: Progressing   Problem: Pain Managment: Goal: General experience of comfort will improve Outcome: Progressing   Problem: Safety: Goal: Ability to remain free from injury will improve Outcome: Progressing   Problem: Skin Integrity: Goal: Risk for impaired skin integrity will decrease Outcome: Progressing

## 2020-08-23 NOTE — Progress Notes (Signed)
Joshua Bean  JKD:[id number] DOB: 09-06-1976 DOA: 08/14/2020 PCP: Jearld Fenton, NP    Brief Narrative:  44 year old with a history of anxiety, HLD, and DM two who presented to the ED with severe shortness of breath following a known Covid exposure. He was found to have a saturation of 84% on room air and was tachycardic. He stated he had been sick for approximately 1 week with fever, headache, nausea, and poor intake. In the ED he was found to have a temperature of 102.9 and was tachycardic. SARS-CoV-2 PCR was positive.  Significant Events:  10/1 admit via ED  Date of Positive COVID Test:  08/14/2020  Vaccination Status: Not vaccinated against Covid  COVID-19 specific Treatment: Steroid 10/1 > Remdesivir 10/2 >10/6 Baricitinib 10/2 >  Antimicrobials:  Azithromycin 10/1 Levaquin 10/3 >  DVT prophylaxis: Lovenox  Subjective: Afebrile.  Low blood pressure persist, which is felt to be his baseline.  Saturations remain 94-100% on 10 L salter high flow nasal cannula.  Patient desaturated profoundly last night while taking a shower when he decided on his own to take off his oxygen.  He did not lose consciousness but did require a prolonged period of increased oxygen support to fully recover.  Assessment & Plan:  COVID Pneumonia -acute hypoxic respiratory failure - sepsis ruled out  Being treated with steroid and baricitinib - has completed a course of remdesivir - continue to wean oxygen as able with patient making significant progress over the last 24 hours with transition to salter high flow- making progress at this time   Mild transaminitis Likely due to Covid itself with possible contribution from Remdesivir -resolved  Uncontrolled DM2 - severe hyperglycemia CBG now much more stable -continue to follow trend  Hypokalemia Corrected with supplementation  Acute kidney injury  crt 1.25 at presentation - resolved w/ volume expansion -creatinine presently normal  HLD  Continue usual statin   Anxiety/depression  Code Status: FULL CODE Family Communication:  Status is: Inpatient  Remains inpatient appropriate because:Inpatient level of care appropriate due to severity of illness   Dispo: The patient is from: Home              Anticipated d/c is to: Home              Anticipated d/c date is: 3 days              Patient currently is not medically stable to d/c.   Consultants:  none  Objective: Blood pressure (!) 80/52, pulse 83, temperature 98.4 F (36.9 C), temperature source Oral, resp. rate 18, height _0  (1.803 m), weight 95.3 kg, SpO2 100 %. No intake or output data in the 24 hours ending 08/23/20 1005 Filed Weights   08/14/20 2231  Weight: 95.3 kg    Examination: General: NAD Lungs: Fine crackles diffusely bilateral fields without change Cardiovascular: RRR  Abdomen: NT/ND, soft, BS positive, no rebound Extremities: No C/C/E B LE  CBC: Recent Labs  Lab 08/18/20 0143 08/18/20 0143 08/19/20 0717 08/19/20 0717 08/20/20 0300 08/21/20 0515 08/23/20 0500  WBC 11.1*   < > 14.3*   < > 16.9* 12.9* 11.3*  NEUTROABS 10.3*  --  11.9*  --  13.5*  --   --   HGB 13.9   < > 14.1   < > 14.5 14.5 14.5  HCT 40.2   < > 41.2   < > 42.4 42.1 41.6  MCV 91.6   < > 90.7   < >  89.5 90.5 89.5  PLT 247   < > 299   < > 346 367 405*   < > = values in this interval not displayed.   Basic Metabolic Panel: Recent Labs  Lab 08/19/20 0717 08/19/20 0717 08/20/20 0300 08/20/20 0300 08/21/20 0514 08/21/20 0515 08/22/20 0409 08/23/20 0500  NA 139   < > 138   < >  --  137 137 136  K 4.0   < > 3.1*   < >  --  3.4* 3.9 3.7  CL 100   < > 102   < >  --  102 103 101  CO2 24   < > 24   < >  --  _0 GLUCOSE 133*   < > 68*   < >  --  54* 60* 106*  BUN 21*   < > 18   < >  --  _1 CREATININE 0.67   < > 0.60*   < >  --  0.66 0.60* 0.68  CALCIUM 8.4*   < > 8.3*   < >  --  8.3* 8.2* 8.5*  MG 2.0  --  1.9  --  1.9  --   --   --    < > =  values in this interval not displayed.   GFR: Estimated Creatinine Clearance: 140.3 mL/min (by C-G formula based on SCr of 0.68 mg/dL).  Liver Function Tests: Recent Labs  Lab 08/18/20 0143 08/19/20 0717 08/20/20 0300 08/23/20 0500  AST 32 _2 ALT 32 _3 ALKPHOS 64 69 67 68  BILITOT 0.6 0.8 1.0 0.5  PROT 5.2* 5.5* 5.4* 5.5*  ALBUMIN 2.2* 2.4* 2.4* 2.3*    HbA1C: Hgb A1c MFr Bld  Date/Time Value Ref Range Status  08/15/2020 03:15 AM 10.8 (H) 4.8 - 5.6 % Final    Comment:    (NOTE) Pre diabetes:          5.7%-6.4%  Diabetes:              >6.4%  Glycemic control for   <7.0% adults with diabetes   06/04/2020 12:52 PM 11.1 (H) 4.8 - 5.6 % Final    Comment:             Prediabetes: 5.7 - 6.4          Diabetes: >6.4          Glycemic control for adults with diabetes: <7.0     CBG: Recent Labs  Lab 08/22/20 0744 08/22/20 1153 08/22/20 1746 08/22/20 2127 08/23/20 0730  GLUCAP 141* 231* 212* 203* 74    Recent Results (from the past 240 hour(s))  Respiratory Panel by RT PCR (Flu A&B, Covid) - Nasopharyngeal Swab     Status: Abnormal   Collection Time: 08/14/20 10:07 PM   Specimen: Nasopharyngeal Swab  Result Value Ref Range Status   SARS Coronavirus 2 by RT PCR POSITIVE (A) NEGATIVE Final    Comment: RESULT CALLED TO, READ BACK BY AND VERIFIED WITH: Aileen Pilot MD 08/14/20 AT 2315 SK  (NOTE) SARS-CoV-2 target nucleic acids are DETECTED.  SARS-CoV-2 RNA is generally detectable in upper respiratory specimens  during the acute phase of infection. Positive results are indicative of the presence of the identified virus, but do not rule out bacterial infection or co-infection with other pathogens not detected by the test. Clinical correlation with patient history and other diagnostic information is necessary to determine patient  infection status. The expected result is Negative.  Fact Sheet for Patients:  PinkCheek.be   Fact Sheet for Healthcare Providers: GravelBags.it  This test is not yet approved or cleared by the [place] FDA and  has been authorized for detection and/or diagnosis of SARS-CoV-2 by FDA under an Emergency Use Authorization (EUA).  This EUA will remain in effect (meaning this test can be  used) for the duration of  the COVID-19 declaration under Section 564(b)(1) of the Act, 21 U.S.C. section 360bbb-3(b)(1), unless the authorization is terminated or revoked sooner.      Influenza A by PCR NEGATIVE NEGATIVE Final   Influenza B by PCR NEGATIVE NEGATIVE Final    Comment: (NOTE) The Xpert Xpress SARS-CoV-2/FLU/RSV assay is intended as an aid in  the diagnosis of influenza from Nasopharyngeal swab specimens and  should not be used as a sole basis for treatment. Nasal washings and  aspirates are unacceptable for Xpert Xpress SARS-CoV-2/FLU/RSV  testing.  Fact Sheet for Patients: PinkCheek.be  Fact Sheet for Healthcare Providers: GravelBags.it  This test is not yet approved or cleared by the [place] FDA and  has been authorized for detection and/or diagnosis of SARS-CoV-2 by  FDA under an Emergency Use Authorization (EUA). This EUA will remain  in effect (meaning this test can be used) for the duration of the  Covid-19 declaration under Section 564(b)(1) of the Act, 21  U.S.C. section 360bbb-3(b)(1), unless the authorization is  terminated or revoked. Performed at [facility], [place] [address], [place], [place] [address]   Blood Culture (routine x 2)     Status: None   Collection Time: 08/14/20 10:07 PM   Specimen: BLOOD  Result Value Ref Range Status   Specimen Description BLOOD RIGHT ANTECUBITAL  Final   Special Requests   Final    BOTTLES DRAWN AEROBIC AND ANAEROBIC Blood Culture adequate volume   Culture   Final    NO GROWTH 5 DAYS Performed at [facility],  [place] [address] [place], [place] [address]    Report Status 08/19/2020 FINAL  Final  Blood Culture (routine x 2)     Status: None   Collection Time: 08/14/20 10:13 PM   Specimen: BLOOD  Result Value Ref Range Status   Specimen Description BLOOD LEFT ANTECUBITAL  Final   Special Requests   Final    BOTTLES DRAWN AEROBIC AND ANAEROBIC Blood Culture adequate volume   Culture   Final    NO GROWTH 5 DAYS Performed at [facility], [place] [address], [place], [place] [address]    Report Status 08/19/2020 FINAL  Final  MRSA PCR Screening     Status: None   Collection Time: 08/15/20  2:28 PM   Specimen: Urine, Clean Catch; Nasopharyngeal  Result Value Ref Range Status   MRSA by PCR NEGATIVE NEGATIVE Final    Comment:        The GeneXpert MRSA Assay (FDA approved for NASAL specimens only), is one component of a comprehensive MRSA colonization surveillance program. It is not intended to diagnose MRSA infection nor to guide or monitor treatment for MRSA infections. Performed at [facility], [place] [address], [place], [place] [address]      Scheduled Meds: . vitamin C  500 mg Oral Daily  . aspirin  81 mg Oral Daily  . atorvastatin  40 mg Oral Daily  . baricitinib  4 mg Oral Daily  . cholecalciferol  1,000 Units Oral Daily  .  enoxaparin (LOVENOX) injection  40 mg Subcutaneous Q0600  . insulin aspart  0-20 Units Subcutaneous TID WC  . insulin aspart  0-5 Units Subcutaneous QHS  . insulin glargine  30 Units Subcutaneous Daily  . insulin starter kit- pen needles  1 kit Other Once  . living well with diabetes book   Does not apply Once  . multivitamin with minerals  1 tablet Oral Daily  . polyethylene glycol  17 g Oral BID  . predniSONE  50 mg Oral Q breakfast  . zinc sulfate  220 mg Oral Daily     LOS: 8 days   Cherene Altes, MD [facility] Office  [phone] Pager - Text Page per Amion  If 7PM-7AM, please contact night-coverage per Amion 08/23/2020, 10:05  AM

## 2020-08-24 DIAGNOSIS — U071 COVID-19: Secondary | ICD-10-CM | POA: Diagnosis not present

## 2020-08-24 DIAGNOSIS — J1282 Pneumonia due to Coronavirus disease 2019: Secondary | ICD-10-CM | POA: Diagnosis not present

## 2020-08-24 LAB — GLUCOSE, CAPILLARY
Glucose-Capillary: 213 mg/dL — ABNORMAL HIGH (ref 70–99)
Glucose-Capillary: 216 mg/dL — ABNORMAL HIGH (ref 70–99)
Glucose-Capillary: 269 mg/dL — ABNORMAL HIGH (ref 70–99)
Glucose-Capillary: 218 mg/dL — ABNORMAL HIGH (ref 70–99)

## 2020-08-24 MED ORDER — MOMETASONE FURO-FORMOTEROL FUM 200-5 MCG/ACT IN AERO
2.0000 | INHALATION_SPRAY | Freq: Two times a day (BID) | RESPIRATORY_TRACT | Status: DC
  Administered 2020-08-24 – 2020-08-30 (×12): 2 via RESPIRATORY_TRACT
  Filled 2020-08-24: qty 8.8

## 2020-08-24 MED ORDER — PREDNISONE 20 MG PO TABS
40.0000 mg | ORAL_TABLET | Freq: Every day | ORAL | Status: DC
  Administered 2020-08-25 – 2020-08-27 (×3): 40 mg via ORAL
  Filled 2020-08-24 (×3): qty 2

## 2020-08-24 NOTE — Progress Notes (Signed)
Joshua Bean  BTD:[id number] DOB: 1976/06/12 DOA: 08/14/2020 PCP: Jearld Fenton, NP    Brief Narrative:  44yo with a history of anxiety, HLD, and DM2 who presented to the ED with severe shortness of breath following a known Covid exposure. He was found to have a saturation of 84% on room air and was tachycardic. He stated he had been sick for approximately 1 week with fever, headache, nausea, and poor intake. In the ED he was found to have a temperature of 102.9 and was tachycardic. SARS-CoV-2 PCR was positive.  Significant Events:  10/1 admit via ED  Date of Positive COVID Test:  08/14/2020  Vaccination Status: Not vaccinated against Covid  COVID-19 specific Treatment: Steroid 10/1 > Remdesivir 10/2 >10/6 Baricitinib 10/2 >  Antimicrobials:  Azithromycin 10/1 Levaquin 10/3 > 10/7  DVT prophylaxis: Lovenox  Subjective: Afebrile.  Blood pressure remained stable with systolics typically in the 90s.  Saturations stable on 8 L high flow salter nasal cannula. Making progress w/ weaning of O2.   Assessment & Plan:  COVID Pneumonia -acute hypoxic respiratory failure - sepsis ruled out  Being treated with steroid and baricitinib - has completed a course of remdesivir - continue to wean oxygen as able -making slow but measurable progress  Mild transaminitis Likely due to Covid itself with possible contribution from Remdesivir -resolved  Uncontrolled DM2 - severe hyperglycemia CBG remains quite variable -monitor trend today without change in treatment at this time  Hypokalemia Corrected with supplementation  Acute kidney injury  crt 1.25 at presentation - resolved w/ volume expansion -creatinine presently normal  HLD Continue usual statin   Anxiety/depression  Code Status: FULL CODE Family Communication:  Status is: Inpatient  Remains inpatient appropriate because:Inpatient level of care appropriate due to severity of illness   Dispo: The patient is from: Home               Anticipated d/c is to: Home              Anticipated d/c date is: 3 days              Patient currently is not medically stable to d/c.   Consultants:  none  Objective: Blood pressure 91/65, pulse 66, temperature 98.4 F (36.9 C), temperature source Oral, resp. rate 20, height _0  (1.803 m), weight 95.3 kg, SpO2 91 %.  Intake/Output Summary (Last 24 hours) at 08/24/2020 1035 Last data filed at 08/23/2020 2131 Gross per 24 hour  Intake --  Output 625 ml  Net -625 ml   Filed Weights   08/14/20 2231  Weight: 95.3 kg    Examination: General: NAD Lungs: Fine crackles diffusely B fields - no wheeze Cardiovascular: RRR  Abdomen: NT/ND, soft, BS positive, no rebound Extremities: No C/C/E B lower extremities   CBC: Recent Labs  Lab 08/18/20 0143 08/18/20 0143 08/19/20 0717 08/19/20 0717 08/20/20 0300 08/21/20 0515 08/23/20 0500  WBC 11.1*   < > 14.3*   < > 16.9* 12.9* 11.3*  NEUTROABS 10.3*  --  11.9*  --  13.5*  --   --   HGB 13.9   < > 14.1   < > 14.5 14.5 14.5  HCT 40.2   < > 41.2   < > 42.4 42.1 41.6  MCV 91.6   < > 90.7   < > 89.5 90.5 89.5  PLT 247   < > 299   < > 346 367 405*   < > = values  in this interval not displayed.   Basic Metabolic Panel: Recent Labs  Lab 08/19/20 0717 08/19/20 0717 08/20/20 0300 08/20/20 0300 08/21/20 0514 08/21/20 0515 08/22/20 0409 08/23/20 0500  NA 139   < > 138   < >  --  137 137 136  K 4.0   < > 3.1*   < >  --  3.4* 3.9 3.7  CL 100   < > 102   < >  --  102 103 101  CO2 24   < > 24   < >  --  _0 GLUCOSE 133*   < > 68*   < >  --  54* 60* 106*  BUN 21*   < > 18   < >  --  _1 CREATININE 0.67   < > 0.60*   < >  --  0.66 0.60* 0.68  CALCIUM 8.4*   < > 8.3*   < >  --  8.3* 8.2* 8.5*  MG 2.0  --  1.9  --  1.9  --   --   --    < > = values in this interval not displayed.   GFR: Estimated Creatinine Clearance: 140.3 mL/min (by C-G formula based on SCr of 0.68 mg/dL).  Liver Function Tests: Recent  Labs  Lab 08/18/20 0143 08/19/20 0717 08/20/20 0300 08/23/20 0500  AST 32 _2 ALT 32 _3 ALKPHOS 64 69 67 68  BILITOT 0.6 0.8 1.0 0.5  PROT 5.2* 5.5* 5.4* 5.5*  ALBUMIN 2.2* 2.4* 2.4* 2.3*    HbA1C: Hgb A1c MFr Bld  Date/Time Value Ref Range Status  08/15/2020 03:15 AM 10.8 (H) 4.8 - 5.6 % Final    Comment:    (NOTE) Pre diabetes:          5.7%-6.4%  Diabetes:              >6.4%  Glycemic control for   <7.0% adults with diabetes   06/04/2020 12:52 PM 11.1 (H) 4.8 - 5.6 % Final    Comment:             Prediabetes: 5.7 - 6.4          Diabetes: >6.4          Glycemic control for adults with diabetes: <7.0     CBG: Recent Labs  Lab 08/23/20 0730 08/23/20 1209 08/23/20 1628 08/23/20 2130 08/24/20 0757  GLUCAP 74 140* 288* 319* 213*    Recent Results (from the past 240 hour(s))  Respiratory Panel by RT PCR (Flu A&B, Covid) - Nasopharyngeal Swab     Status: Abnormal   Collection Time: 08/14/20 10:07 PM   Specimen: Nasopharyngeal Swab  Result Value Ref Range Status   SARS Coronavirus 2 by RT PCR POSITIVE (A) NEGATIVE Final    Comment: RESULT CALLED TO, READ BACK BY AND VERIFIED WITH: Aileen Pilot MD 08/14/20 AT 2315 SK  (NOTE) SARS-CoV-2 target nucleic acids are DETECTED.  SARS-CoV-2 RNA is generally detectable in upper respiratory specimens  during the acute phase of infection. Positive results are indicative of the presence of the identified virus, but do not rule out bacterial infection or co-infection with other pathogens not detected by the test. Clinical correlation with patient history and other diagnostic information is necessary to determine patient infection status. The expected result is Negative.  Fact Sheet for Patients:  PinkCheek.be  Fact Sheet for Healthcare Providers: GravelBags.it  This test  is not yet approved or cleared by the [place] and  has been  authorized for detection and/or diagnosis of SARS-CoV-2 by FDA under an Emergency Use Authorization (EUA).  This EUA will remain in effect (meaning this test can be  used) for the duration of  the COVID-19 declaration under Section 564(b)(1) of the Act, 21 U.S.C. section 360bbb-3(b)(1), unless the authorization is terminated or revoked sooner.      Influenza A by PCR NEGATIVE NEGATIVE Final   Influenza B by PCR NEGATIVE NEGATIVE Final    Comment: (NOTE) The Xpert Xpress SARS-CoV-2/FLU/RSV assay is intended as an aid in  the diagnosis of influenza from Nasopharyngeal swab specimens and  should not be used as a sole basis for treatment. Nasal washings and  aspirates are unacceptable for Xpert Xpress SARS-CoV-2/FLU/RSV  testing.  Fact Sheet for Patients: PinkCheek.be  Fact Sheet for Healthcare Providers: GravelBags.it  This test is not yet approved or cleared by the [place] FDA and  has been authorized for detection and/or diagnosis of SARS-CoV-2 by  FDA under an Emergency Use Authorization (EUA). This EUA will remain  in effect (meaning this test can be used) for the duration of the  Covid-19 declaration under Section 564(b)(1) of the Act, 21  U.S.C. section 360bbb-3(b)(1), unless the authorization is  terminated or revoked. Performed at [facility], [place] [address], [place], [place] [address]   Blood Culture (routine x 2)     Status: None   Collection Time: 08/14/20 10:07 PM   Specimen: BLOOD  Result Value Ref Range Status   Specimen Description BLOOD RIGHT ANTECUBITAL  Final   Special Requests   Final    BOTTLES DRAWN AEROBIC AND ANAEROBIC Blood Culture adequate volume   Culture   Final    NO GROWTH 5 DAYS Performed at [facility], [place] [address] [place], [place] [address]    Report Status 08/19/2020 FINAL  Final  Blood Culture (routine x 2)     Status: None   Collection Time: 08/14/20 10:13 PM    Specimen: BLOOD  Result Value Ref Range Status   Specimen Description BLOOD LEFT ANTECUBITAL  Final   Special Requests   Final    BOTTLES DRAWN AEROBIC AND ANAEROBIC Blood Culture adequate volume   Culture   Final    NO GROWTH 5 DAYS Performed at [facility], [place] [address] [place], [place] [address]    Report Status 08/19/2020 FINAL  Final  MRSA PCR Screening     Status: None   Collection Time: 08/15/20  2:28 PM   Specimen: Urine, Clean Catch; Nasopharyngeal  Result Value Ref Range Status   MRSA by PCR NEGATIVE NEGATIVE Final    Comment:        The GeneXpert MRSA Assay (FDA approved for NASAL specimens only), is one component of a comprehensive MRSA colonization surveillance program. It is not intended to diagnose MRSA infection nor to guide or monitor treatment for MRSA infections. Performed at [facility], [place] [address], [place], [place] [address]      Scheduled Meds: . vitamin C  500 mg Oral Daily  . aspirin  81 mg Oral Daily  . atorvastatin  40 mg Oral Daily  . baricitinib  4 mg Oral Daily  . cholecalciferol  1,000 Units Oral Daily  . enoxaparin (LOVENOX) injection  40 mg Subcutaneous Q0600  . insulin aspart  0-20 Units Subcutaneous TID WC  . insulin aspart  0-5 Units Subcutaneous QHS  . insulin glargine  30 Units Subcutaneous Daily  . insulin starter kit- pen needles  1 kit Other Once  . living well with diabetes book   Does not apply Once  . multivitamin with minerals  1 tablet Oral Daily  . polyethylene glycol  17 g Oral BID  . predniSONE  50 mg Oral Q breakfast  . zinc sulfate  220 mg Oral Daily     LOS: 9 days   Cherene Altes, MD [facility] Office  [phone] Pager - Text Page per Amion  If 7PM-7AM, please contact night-coverage per Amion 08/24/2020, 10:35 AM

## 2020-08-24 NOTE — Plan of Care (Signed)
Patient is currently resting in bed. Denies pain. VSS. Remains on 8L HFNC. OOB to [facility]. Call bell within reach.   Problem: Coping: Goal: Psychosocial and spiritual needs will be supported Outcome: Progressing   Problem: Respiratory: Goal: Will maintain a patent airway Outcome: Progressing Goal: Complications related to the disease process, condition or treatment will be avoided or minimized Outcome: Progressing   Problem: Education: Goal: Knowledge of General Education information will improve Description: Including pain rating scale, medication(s)/side effects and non-pharmacologic comfort measures Outcome: Progressing   Problem: Health Behavior/Discharge Planning: Goal: Ability to manage health-related needs will improve Outcome: Progressing   Problem: Clinical Measurements: Goal: Ability to maintain clinical measurements within normal limits will improve Outcome: Progressing Goal: Will remain free from infection Outcome: Progressing Goal: Diagnostic test results will improve Outcome: Progressing Goal: Respiratory complications will improve Outcome: Progressing Goal: Cardiovascular complication will be avoided Outcome: Progressing   Problem: Activity: Goal: Risk for activity intolerance will decrease Outcome: Progressing   Problem: Nutrition: Goal: Adequate nutrition will be maintained Outcome: Progressing   Problem: Coping: Goal: Level of anxiety will decrease Outcome: Progressing   Problem: Elimination: Goal: Will not experience complications related to bowel motility Outcome: Progressing Goal: Will not experience complications related to urinary retention Outcome: Progressing   Problem: Pain Managment: Goal: General experience of comfort will improve Outcome: Progressing   Problem: Safety: Goal: Ability to remain free from injury will improve Outcome: Progressing   Problem: Skin Integrity: Goal: Risk for impaired skin integrity will decrease Outcome:  Progressing

## 2020-08-24 NOTE — Plan of Care (Signed)
  Problem: Coping: Goal: Psychosocial and spiritual needs will be supported Outcome: Progressing   

## 2020-08-24 NOTE — Progress Notes (Signed)
Insulin adm taught to the patient.  He done very well.  Return demonstration was done correctly.  Went over different scenarios of what could happen and how to deal with them.  Pt verbalizes understanding.  Morrison Old made aware that the pt could correctly adm insulin via PEN to himself.

## 2020-08-24 NOTE — Progress Notes (Signed)
Physical Therapy Treatment Patient Details Name: Joshua Bean MRN: [id number] DOB: 03/07/1976 Today's Date: 08/24/2020    History of Present Illness Joshua Bean is a 44 y.o. male with medical history significant of anxiety, hyperlipidemia, type 2 diabetes not on any medications presenting with a chief complaint of shortness of breath.  He did have a known Covid exposure.  Satting 84% on room air with EMS, improved to 90% with 6 L supplemental oxygen.  Tachycardic with heart rate in the 110s.  Tachypneic with respiratory rate in the 20s.  Patient states he has not been vaccinated against Covid.    PT Comments    Pt received in bed. Agreeable to participation in therapy. SpO2 90% on 8L at rest. Mod I bed mobility and transfers. Min guard assist ambulation 40' without AD. No LOB noted this session. Amb on 10L with desat to 82% due to coughing. Pt performed LE exercises sitting EOB. Pt in recliner at end of session, SpO2 86% on 8L. Pt reports he has ambulating to/from bathroom independently and removing O2.   Follow Up Recommendations  Home health PT     Equipment Recommendations  None recommended by PT    Recommendations for Other Services       Precautions / Restrictions Precautions Precautions: Fall;Other (comment) Precaution Comments: watch sats    Mobility  Bed Mobility Overal bed mobility: Modified Independent                Transfers Overall transfer level: Modified independent Equipment used: None                Ambulation/Gait Ambulation/Gait assistance: Min guard Gait Distance (Feet): 40 Feet Assistive device: None Gait Pattern/deviations: Step-through pattern;Decreased stride length Gait velocity: decreased   General Gait Details: min guard assist for safety. No LOB noted.   Stairs             Wheelchair Mobility    Modified Rankin (Stroke Patients Only)       Balance Overall balance assessment: Needs assistance Sitting-balance  support: Feet supported;No upper extremity supported Sitting balance-Leahy Scale: Good     Standing balance support: During functional activity;No upper extremity supported Standing balance-Leahy Scale: Fair                              Cognition Arousal/Alertness: Awake/alert Behavior During Therapy: Flat affect;Impulsive Overall Cognitive Status: History of cognitive impairments - at baseline                                        Exercises General Exercises - Lower Extremity Ankle Circles/Pumps: AROM;Both;10 reps;Seated Long Arc Quad: AROM;Right;Left;10 reps;Seated Hip Flexion/Marching: AROM;Right;Left;10 reps;Seated    General Comments General comments (skin integrity, edema, etc.): SpO2 90% at rest on 8L. Mobilized on 10L with desat to 82% due to coughing. SpO2 86% on 8L at end of session in recliner.      Pertinent Vitals/Pain Pain Assessment: No/denies pain    Home Living                      Prior Function            PT Goals (current goals can now be found in the care plan section) Acute Rehab PT Goals Patient Stated Goal: home Progress towards PT goals: Progressing toward goals  Frequency    Min 3X/week      PT Plan Current plan remains appropriate    Co-evaluation              AM-PAC PT "6 Clicks" Mobility   Outcome Measure  Help needed turning from your back to your side while in a flat bed without using bedrails?: None Help needed moving from lying on your back to sitting on the side of a flat bed without using bedrails?: None Help needed moving to and from a bed to a chair (including a wheelchair)?: A Little Help needed standing up from a chair using your arms (e.g., wheelchair or bedside chair)?: A Little Help needed to walk in hospital room?: A Little Help needed climbing 3-5 steps with a railing? : A Lot 6 Click Score: 19    End of Session Equipment Utilized During Treatment: Oxygen Activity  Tolerance: Other (comment) (coughing) Patient left: in chair;with call bell/phone within reach Nurse Communication: Mobility status PT Visit Diagnosis: Unsteadiness on feet (R26.81);Muscle weakness (generalized) (M62.81);Difficulty in walking, not elsewhere classified (R26.2)     Time: 1855-0158 PT Time Calculation (min) (ACUTE ONLY): 16 min  Charges:  $Gait Training: 8-22 mins                     Lorrin Goodell, PT  Office # [phone] Pager [phone]    Joshua Bean 08/24/2020, 12:40 PM

## 2020-08-25 DIAGNOSIS — U071 COVID-19: Secondary | ICD-10-CM | POA: Diagnosis not present

## 2020-08-25 DIAGNOSIS — J1282 Pneumonia due to Coronavirus disease 2019: Secondary | ICD-10-CM | POA: Diagnosis not present

## 2020-08-25 LAB — GLUCOSE, CAPILLARY
Glucose-Capillary: 82 mg/dL (ref 70–99)
Glucose-Capillary: 126 mg/dL — ABNORMAL HIGH (ref 70–99)
Glucose-Capillary: 264 mg/dL — ABNORMAL HIGH (ref 70–99)
Glucose-Capillary: 217 mg/dL — ABNORMAL HIGH (ref 70–99)

## 2020-08-25 MED ORDER — BENZONATATE 100 MG PO CAPS
200.0000 mg | ORAL_CAPSULE | Freq: Three times a day (TID) | ORAL | Status: DC
  Administered 2020-08-25 – 2020-08-30 (×16): 200 mg via ORAL
  Filled 2020-08-25 (×16): qty 2

## 2020-08-25 NOTE — TOC Initial Note (Signed)
Transition of Care [facility]) - Initial/Assessment Note    Patient Details  Name: Joshua Bean MRN: [id number] Date of Birth: 11-08-1976  Transition of Care [facility] CM/SW Contact:    Sherrilyn Rist Transition of Care Supervisor Phone Number: [phone] 08/25/2020, 10:40 AM  Clinical Narrative:                 Information obtained from chart- remains on 8L [place] 02; Patient was independent prior to admission. Works full time at [employer]; PCP: Jearld Fenton, NP; has private insurance with [facility] with prescription drug coverage. Possibly need home 02 at discharge. TOC following for progression of care.  Expected Discharge Plan: Home/Self Care Barriers to Discharge: No Barriers Identified   Patient Goals and CMS Choice     Choice offered to / list presented to : NA  Expected Discharge Plan and Services Expected Discharge Plan: Home/Self Care In-house Referral: NA Discharge Planning Services: NA Post Acute Care Choice: NA Living arrangements for the past 2 months: Single Family Home                   DME Agency: NA       HH Arranged: NA          Prior Living Arrangements/Services Living arrangements for the past 2 months: Single Family Home Lives with:: Self                   Activities of Daily Living Home Assistive Devices/Equipment: Eyeglasses, CBG Meter ADL Screening (condition at time of admission) Patient's cognitive ability adequate to safely complete daily activities?: No Is the patient deaf or have difficulty hearing?: No Does the patient have difficulty seeing, even when wearing glasses/contacts?: No Does the patient have difficulty concentrating, remembering, or making decisions?: No Patient able to express need for assistance with ADLs?: Yes Does the patient have difficulty dressing or bathing?: No Independently performs ADLs?: Yes (appropriate for developmental age) Does the patient have difficulty walking or climbing  stairs?: No Weakness of Legs: Both (st times) Weakness of Arms/Hands: Both (at times)  Permission Sought/Granted                  Emotional Assessment         Alcohol / Substance Use: Not Applicable Psych Involvement: No (comment)  Admission diagnosis:  Hyperglycemia [R73.9] Acute respiratory failure with hypoxia ([place]) [J96.01] Pneumonia due to COVID-19 virus [U07.1, J12.82] Patient Active Problem List   Diagnosis Date Noted  . Pneumonia due to COVID-19 virus 08/15/2020  . Sepsis ([place]) 08/15/2020  . Acute respiratory failure with hypoxia ([place]) 08/15/2020  . Idiopathic gout of ankle 06/04/2020  . Erectile dysfunction 06/04/2020  . Anxiety and depression 06/04/2020  . Diabetes mellitus ([place]) 06/07/2016  . HLD (hyperlipidemia) 11/03/2014   PCP:  Jearld Fenton, NP Pharmacy:   [id number] - [place], [place] [address] Phone: [phone] Fax: [phone]     Social Determinants of Health (SDOH) Interventions    Readmission Risk Interventions No flowsheet data found.

## 2020-08-25 NOTE — Progress Notes (Signed)
Inpatient Diabetes Program Recommendations  AACE/ADA: New Consensus Statement on Inpatient Glycemic Control (2015)  Target Ranges:  Prepandial:   less than 140 mg/dL      Peak postprandial:   less than 180 mg/dL (1-2 hours)      Critically ill patients:  140 - 180 mg/dL   Lab Results  Component Value Date   GLUCAP 82 08/25/2020   HGBA1C 10.8 (H) 08/15/2020    Review of Glycemic Control Results for Joshua Bean, Joshua Bean (MRN [id number]) as of 08/25/2020 10:55  Ref. Range 08/23/2020 07:30 08/23/2020 12:09 08/23/2020 16:28 08/23/2020 21:30 08/24/2020 07:57 08/24/2020 12:42 08/24/2020 16:43 08/24/2020 20:44 08/25/2020 07:33  Glucose-Capillary Latest Ref Range: 70 - 99 mg/dL 74 140 (H) 288 (H) 319 (H) 213 (H) 216 (H) 269 (H) 218 (H) 82   Diabetes history:  DM Outpatient Diabetes medications:  None Current orders for Inpatient glycemic control:  Lantus 30 units daily Novolog 0-20 units tid Novolog 0-5 units qhs Prednisone 40 mg daily  Inpatient Diabetes Program Recommendations:    Fasting blood sugar was 74 mg/dL on 08/23/20, 214 mg/dL on 10/11 (likely due to being 319 the night before) and 82 mg/dL this am.  Postprandials are elevated.  Might consider, 1) Novolog 3 units tid with meals if eats at least 50%  2)Lantus 25 units daily  Nursing staff has been educating patient on DM and insulin management.    Will continue to follow while inpatient.  Thank you, Reche Dixon, RN, BSN Diabetes Coordinator Inpatient Diabetes Program [phone] (team pager from 8a-5p)

## 2020-08-25 NOTE — Plan of Care (Signed)
Patient is currently resting in bed. VSS. BP soft. Remains on 8L Oxygen. Patient given cough medicine throughout the night, frequent coughing throughout the night. OOB to BR once. Call bell within reach.   Problem: Coping: Goal: Psychosocial and spiritual needs will be supported Outcome: Progressing   Problem: Respiratory: Goal: Will maintain a patent airway Outcome: Progressing Goal: Complications related to the disease process, condition or treatment will be avoided or minimized Outcome: Progressing   Problem: Education: Goal: Knowledge of General Education information will improve Description: Including pain rating scale, medication(s)/side effects and non-pharmacologic comfort measures Outcome: Progressing   Problem: Health Behavior/Discharge Planning: Goal: Ability to manage health-related needs will improve Outcome: Progressing   Problem: Clinical Measurements: Goal: Ability to maintain clinical measurements within normal limits will improve Outcome: Progressing Goal: Will remain free from infection Outcome: Progressing Goal: Diagnostic test results will improve Outcome: Progressing Goal: Respiratory complications will improve Outcome: Progressing Goal: Cardiovascular complication will be avoided Outcome: Progressing   Problem: Activity: Goal: Risk for activity intolerance will decrease Outcome: Progressing   Problem: Nutrition: Goal: Adequate nutrition will be maintained Outcome: Progressing   Problem: Coping: Goal: Level of anxiety will decrease Outcome: Progressing   Problem: Elimination: Goal: Will not experience complications related to bowel motility Outcome: Progressing Goal: Will not experience complications related to urinary retention Outcome: Progressing   Problem: Pain Managment: Goal: General experience of comfort will improve Outcome: Progressing   Problem: Safety: Goal: Ability to remain free from injury will improve Outcome: Progressing    Problem: Skin Integrity: Goal: Risk for impaired skin integrity will decrease Outcome: Progressing

## 2020-08-25 NOTE — Progress Notes (Addendum)
Joshua Bean  KKX:[id number] DOB: 1976-05-11 DOA: 08/14/2020 PCP: Jearld Fenton, NP    Brief Narrative:  44yo with a history of anxiety, HLD, and DM2 who presented to the ED with severe shortness of breath following a known Covid exposure. He was found to have a saturation of 84% on room air and was tachycardic. He stated he had been sick for approximately 1 week with fever, headache, nausea, and poor intake. In the ED he was found to have a temperature of 102.9 and was tachycardic. SARS-CoV-2 PCR was positive.  Significant Events:  10/1 admit via ED  Date of Positive COVID Test:  08/14/2020  Vaccination Status: Not vaccinated against Covid  COVID-19 specific Treatment: Steroid 10/1 > Remdesivir 10/2 >10/6 Baricitinib 10/2 >  Antimicrobials:  Azithromycin 10/1 Levaquin 10/3 > 10/7  DVT prophylaxis: Lovenox  Subjective: Afebrile. BP low, but this is c/w his baseline. No new complaints. Making very slow but appreciable progress.   Assessment & Plan:  COVID Pneumonia -acute hypoxic respiratory failure - sepsis ruled out  Being treated with steroid and baricitinib - has completed a course of remdesivir - continue to wean oxygen as able -making slow but measurable progress - f/u CXR in AM   Recent Labs  Lab 08/19/20 0717 08/20/20 0300 08/21/20 0514 08/23/20 0500  DDIMER 1.16* 1.04* 0.86*  --   CRP 0.7 0.7  --   --   ALT 28 27  --  20  PROCALCITON <0.10 <0.10  --   --     Persistent Hypotension BP has been in the 70-90 range consistently th/o the majority of the hospital stay - the pt is not tachycardica nor is he orthostatic - his d-dimer has only every been mildly elevated, and was trending downward - venous duplex was negative for LE DVT - a serum cortisol was low at 2, but this was while on high dose steroid, so I suspect it is not truly reflective of his adrenal state - ACTH stim test would not be accurate given ongoing steroid dosing, and also not likely to be  adrenal crisis/insuff w/ pt not being tachycardic - will recheck d-dimer in AM and if up again will strongly consider CTa chest to rule out large PE - keep possibility of a pericardial effusion in differential as well, though this is much less likely - likely simply reflective of his baseline  Mild transaminitis Likely due to Covid itself with possible contribution from Remdesivir -resolved  Uncontrolled DM2 - severe hyperglycemia CBG remains quite variable -monitor trend again today without change in treatment at this time  Hypokalemia Corrected with supplementation  Acute kidney injury  crt 1.25 at presentation - resolved w/ volume expansion -creatinine presently normal  HLD Continue usual statin   Anxiety/depression  Code Status: FULL CODE Family Communication:  Status is: Inpatient  Remains inpatient appropriate because:Inpatient level of care appropriate due to severity of illness   Dispo: The patient is from: Home              Anticipated d/c is to: Home              Anticipated d/c date is: 3 days              Patient currently is not medically stable to d/c.   Consultants:  none  Objective: Blood pressure (!) 85/59, pulse 77, temperature 98.4 F (36.9 C), temperature source Oral, resp. rate 16, height '5\' 11"'  (1.803 m), weight 95.3 kg, SpO2  95 %.  Intake/Output Summary (Last 24 hours) at 08/25/2020 1011 Last data filed at 08/25/2020 0545 Gross per 24 hour  Intake --  Output 600 ml  Net -600 ml   Filed Weights   08/14/20 2231  Weight: 95.3 kg    Examination: General: NAD Lungs: Fine crackles diffusely w/o wheezing  Cardiovascular: RRR  Abdomen: NT/ND, soft, BS positive, no rebound Extremities: No C/C/E B LE   CBC: Recent Labs  Lab 08/19/20 0717 08/19/20 0717 08/20/20 0300 08/21/20 0515 08/23/20 0500  WBC 14.3*   < > 16.9* 12.9* 11.3*  NEUTROABS 11.9*  --  13.5*  --   --   HGB 14.1   < > 14.5 14.5 14.5  HCT 41.2   < > 42.4 42.1 41.6  MCV 90.7    < > 89.5 90.5 89.5  PLT 299   < > 346 367 405*   < > = values in this interval not displayed.   Basic Metabolic Panel: Recent Labs  Lab 08/19/20 0717 08/19/20 0717 08/20/20 0300 08/20/20 0300 08/21/20 0514 08/21/20 0515 08/22/20 0409 08/23/20 0500  NA 139   < > 138   < >  --  137 137 136  K 4.0   < > 3.1*   < >  --  3.4* 3.9 3.7  CL 100   < > 102   < >  --  102 103 101  CO2 24   < > 24   < >  --  '23 24 23  ' GLUCOSE 133*   < > 68*   < >  --  54* 60* 106*  BUN 21*   < > 18   < >  --  '14 16 12  ' CREATININE 0.67   < > 0.60*   < >  --  0.66 0.60* 0.68  CALCIUM 8.4*   < > 8.3*   < >  --  8.3* 8.2* 8.5*  MG 2.0  --  1.9  --  1.9  --   --   --    < > = values in this interval not displayed.   GFR: Estimated Creatinine Clearance: 140.3 mL/min (by C-G formula based on SCr of 0.68 mg/dL).  Liver Function Tests: Recent Labs  Lab 08/19/20 0717 08/20/20 0300 08/23/20 0500  AST '25 21 17  ' ALT '28 27 20  ' ALKPHOS 69 67 68  BILITOT 0.8 1.0 0.5  PROT 5.5* 5.4* 5.5*  ALBUMIN 2.4* 2.4* 2.3*    HbA1C: Hgb A1c MFr Bld  Date/Time Value Ref Range Status  08/15/2020 03:15 AM 10.8 (H) 4.8 - 5.6 % Final    Comment:    (NOTE) Pre diabetes:          5.7%-6.4%  Diabetes:              >6.4%  Glycemic control for   <7.0% adults with diabetes   06/04/2020 12:52 PM 11.1 (H) 4.8 - 5.6 % Final    Comment:             Prediabetes: 5.7 - 6.4          Diabetes: >6.4          Glycemic control for adults with diabetes: <7.0     CBG: Recent Labs  Lab 08/24/20 0757 08/24/20 1242 08/24/20 1643 08/24/20 2044 08/25/20 0733  GLUCAP 213* 216* 269* 218* 82    Recent Results (from the past 240 hour(s))  MRSA PCR Screening     Status:  None   Collection Time: 08/15/20  2:28 PM   Specimen: Urine, Clean Catch; Nasopharyngeal  Result Value Ref Range Status   MRSA by PCR NEGATIVE NEGATIVE Final    Comment:        The GeneXpert MRSA Assay (FDA approved for NASAL specimens only), is one  component of a comprehensive MRSA colonization surveillance program. It is not intended to diagnose MRSA infection nor to guide or monitor treatment for MRSA infections. Performed at [facility], Start [address], [place], [place] [address]      Scheduled Meds: . vitamin C  500 mg Oral Daily  . aspirin  81 mg Oral Daily  . atorvastatin  40 mg Oral Daily  . baricitinib  4 mg Oral Daily  . cholecalciferol  1,000 Units Oral Daily  . enoxaparin (LOVENOX) injection  40 mg Subcutaneous Q0600  . insulin aspart  0-20 Units Subcutaneous TID WC  . insulin aspart  0-5 Units Subcutaneous QHS  . insulin glargine  30 Units Subcutaneous Daily  . insulin starter kit- pen needles  1 kit Other Once  . living well with diabetes book   Does not apply Once  . mometasone-formoterol  2 puff Inhalation BID  . multivitamin with minerals  1 tablet Oral Daily  . polyethylene glycol  17 g Oral BID  . predniSONE  40 mg Oral Q breakfast  . zinc sulfate  220 mg Oral Daily     LOS: 10 days   Cherene Altes, MD [facility] Office  [phone] Pager - Text Page per Amion  If 7PM-7AM, please contact night-coverage per Amion 08/25/2020, 10:11 AM

## 2020-08-26 ENCOUNTER — Inpatient Hospital Stay (HOSPITAL_COMMUNITY): Payer: 59

## 2020-08-26 DIAGNOSIS — U071 COVID-19: Secondary | ICD-10-CM | POA: Diagnosis not present

## 2020-08-26 DIAGNOSIS — J1282 Pneumonia due to Coronavirus disease 2019: Secondary | ICD-10-CM | POA: Diagnosis not present

## 2020-08-26 LAB — COMPREHENSIVE METABOLIC PANEL
ALT: 24 U/L (ref 0–44)
AST: 15 U/L (ref 15–41)
Albumin: 2.5 g/dL — ABNORMAL LOW (ref 3.5–5.0)
Alkaline Phosphatase: 62 U/L (ref 38–126)
Anion gap: 9 (ref 5–15)
BUN: 13 mg/dL (ref 6–20)
CO2: 26 mmol/L (ref 22–32)
Calcium: 8.6 mg/dL — ABNORMAL LOW (ref 8.9–10.3)
Chloride: 102 mmol/L (ref 98–111)
Creatinine, Ser: 0.67 mg/dL (ref 0.61–1.24)
GFR, Estimated: >60 mL/min (ref >60)
Glucose, Bld: 96 mg/dL (ref 70–99)
Potassium: 3.4 mmol/L — ABNORMAL LOW (ref 3.5–5.1)
Sodium: 137 mmol/L (ref 135–145)
Total Bilirubin: 0.4 mg/dL (ref 0.3–1.2)
Total Protein: 5.6 g/dL — ABNORMAL LOW (ref 6.5–8.1)

## 2020-08-26 LAB — CBC
HCT: 40.3 % (ref 39.0–52.0)
Hemoglobin: 14 g/dL (ref 13.0–17.0)
MCH: 31.3 pg (ref 26.0–34.0)
MCHC: 34.7 g/dL (ref 30.0–36.0)
MCV: 90 fL (ref 80.0–100.0)
Platelets: 496 10*3/uL — ABNORMAL HIGH (ref 150–400)
RBC: 4.48 MIL/uL (ref 4.22–5.81)
RDW: 12.5 % (ref 11.5–15.5)
WBC: 9.7 10*3/uL (ref 4.0–10.5)
nRBC: 0 % (ref 0.0–0.2)

## 2020-08-26 LAB — GLUCOSE, CAPILLARY
Glucose-Capillary: 83 mg/dL (ref 70–99)
Glucose-Capillary: 223 mg/dL — ABNORMAL HIGH (ref 70–99)
Glucose-Capillary: 255 mg/dL — ABNORMAL HIGH (ref 70–99)
Glucose-Capillary: 288 mg/dL — ABNORMAL HIGH (ref 70–99)

## 2020-08-26 LAB — D-DIMER, QUANTITATIVE: D-Dimer, Quant: 0.55 ug/mL-FEU — ABNORMAL HIGH (ref 0.00–0.50)

## 2020-08-26 LAB — C-REACTIVE PROTEIN: CRP: <0.5 mg/dL (ref <1.0)

## 2020-08-26 LAB — CK: Total CK: 34 U/L — ABNORMAL LOW (ref 49–397)

## 2020-08-26 NOTE — Progress Notes (Signed)
PROGRESS NOTE    SALAAR HELLYER  JYN:[id number] DOB: [birth year] DOA: 08/14/2020 PCP: Lorre Munroe, NP    Brief Narrative:  Joshua Bean is a 44 year old male with past medical history notable for anxiety, hyperlipidemia, type 2 diabetes mellitus who presented to the ED with progressive shortness of breath following a known Covid-19 exposure.  On arrival he was noted to have a SPO2 84% on room air and was tachycardic.  Patient also with associated fever, headache, nausea and poor oral intake.  In the ED, he was found to have a temperature of 102.9, tachycardic, SARS-CoV-2 PCR positive.  Hospitalist service was consulted for further evaluation and treatment of acute hypoxic respiratory failure secondary to Covid-19 viral pneumonia.   Assessment & Plan:   Principal Problem:   Pneumonia due to COVID-19 virus Active Problems:   Diabetes mellitus (HCC)   Anxiety and depression   Sepsis (HCC)   Acute respiratory failure with hypoxia (HCC)   Acute hypoxic respiratory failure secondary to acute Covid-19 viral pneumonia during the ongoing 2020/2021 Covid 19 Pandemic - POA Patient presenting to the ED with progressive shortness of breath, known recent Covid-19 exposure.  On arrival he was found to be hypoxic with SPO2 84% on room air.  Pain with heterogeneous airspace opacities bilaterally consistent with viral multifocal pneumonia. --COVID test: + 08/14/2020 --CRP 20.5>7.8>2.5>1.6>0.7>0.7><0.5 --ddimer 2.35>0.84>0.71>0.80>1.16>1.04>0.86>0.55 --Completed 5-day course of remdesivir 08/19/2020 --Baricitinib 4mg  PO daily (Day #12/14) --Continue prednisone 40 mg p.o. daily --Mometasone-formoterol 200-5 mcg MDI 2 puffs twice daily --prone for 2-3hrs every 12hrs if able --Continue supplemental oxygen, titrate to maintain SPO2 greater than 92%, currently on 8 L high flow nasal cannula with SPO2 91% --Continue supportive care with albuterol MDI prn, vitamin C, zinc, Tylenol, antitussives  (benzonatate/ Mucinex/Tussionex) --Follow CBC, CMP, D-dimer, ferritin, and CRP daily --Continue airborne/contact isolation precautions for 3 weeks from the day of diagnosis  The treatment plan and use of medications and known side effects were discussed with patient/family. Some of the medications used are based on case reports/anecdotal data.  All other medications being used in the management of COVID-19 based on limited study data.  Complete risks and long-term side effects are unknown, however in the best clinical judgment they seem to be of some benefit.  Patient wanted to proceed with treatment options provided.  Type 2 diabetes mellitus: Hemoglobin A1c 10.8, poorly controlled.  Not on medications at home. --Lantus 30 units subcutaneously daily --Resistance insulin sliding scale for further coverage --CBGs before every meal/at bedtime  Hyperlipidemia: Atorvastatin 40 mg p.o. daily  Anxiety: --Xanax 0.5 mg 3 times daily as needed   DVT prophylaxis: Lovenox Code Status: Full code Family Communication: Updated patient extensively at bedside  Disposition Plan:  Status is: Inpatient  Remains inpatient appropriate because:Ongoing diagnostic testing needed not appropriate for outpatient work up, Unsafe d/c plan, IV treatments appropriate due to intensity of illness or inability to take PO and Inpatient level of care appropriate due to severity of illness   Dispo: The patient is from: Home              Anticipated d/c is to: Home              Anticipated d/c date is: > 3 days              Patient currently is not medically stable to d/c.  Continues with high FiO2 requirements   Consultants:   None  Procedures:   None  Antimicrobials:  None   Subjective: Patient seen and examined bedside, resting comfortably in bed.  Continues with shortness of breath and on 8 L high flow nasal cannula.  Patient frustrated about lack of progress.  No other complaints or concerns at this  time.  Denies headache, no visual changes, no chest pain, no palpitations, no nausea/vomiting/diarrhea, no fever/chills/night sweats, no weakness, no fatigue, no paresthesias.  No acute events overnight per nursing staff.  Objective: Vitals:   08/25/20 1700 08/25/20 2033 08/26/20 0441 08/26/20 1414  BP:  97/69 (!) 92/59 104/68  Pulse:  97 69 91  Resp:  16 18 18   Temp:  (!) 97.5 F (36.4 C) 98.3 F (36.8 C) 98.5 F (36.9 C)  TempSrc:  Axillary Oral Oral  SpO2: 92% 91% 91% 90%  Weight:      Height:        Intake/Output Summary (Last 24 hours) at 08/26/2020 1559 Last data filed at 08/26/2020 1352 Gross per 24 hour  Intake 240 ml  Output --  Net 240 ml   Filed Weights   08/14/20 2231  Weight: 95.3 kg    Examination:  General exam: Appears calm and comfortable  Respiratory system: Clear to auscultation. Respiratory effort normal.  On 8 L high flow nasal cannula with SPO2 91% Cardiovascular system: S1 & S2 heard, RRR. No JVD, murmurs, rubs, gallops or clicks. No pedal edema. Gastrointestinal system: Abdomen is nondistended, soft and nontender. No organomegaly or masses felt. Normal bowel sounds heard. Central nervous system: Alert and oriented. No focal neurological deficits. Extremities: Symmetric 5 x 5 power. Skin: No rashes, lesions or ulcers Psychiatry: Judgement and insight appear normal. Mood & affect appropriate.     Data Reviewed: I have personally reviewed following labs and imaging studies  CBC: Recent Labs  Lab 08/20/20 0300 08/21/20 0515 08/23/20 0500 08/26/20 0709  WBC 16.9* 12.9* 11.3* 9.7  NEUTROABS 13.5*  --   --   --   HGB 14.5 14.5 14.5 14.0  HCT 42.4 42.1 41.6 40.3  MCV 89.5 90.5 89.5 90.0  PLT 346 367 405* 496*   Basic Metabolic Panel: Recent Labs  Lab 08/20/20 0300 08/21/20 0514 08/21/20 0515 08/22/20 0409 08/23/20 0500 08/26/20 0709  NA 138  --  137 137 136 137  K 3.1*  --  3.4* 3.9 3.7 3.4*  CL 102  --  102 103 101 102  CO2 24   --  23 24 23 26   GLUCOSE 68*  --  54* 60* 106* 96  BUN 18  --  14 16 12 13   CREATININE 0.60*  --  0.66 0.60* 0.68 0.67  CALCIUM 8.3*  --  8.3* 8.2* 8.5* 8.6*  MG 1.9 1.9  --   --   --   --    GFR: Estimated Creatinine Clearance: 140.3 mL/min (by C-G formula based on SCr of 0.67 mg/dL). Liver Function Tests: Recent Labs  Lab 08/20/20 0300 08/23/20 0500 08/26/20 0709  AST 21 17 15   ALT 27 20 24   ALKPHOS 67 68 62  BILITOT 1.0 0.5 0.4  PROT 5.4* 5.5* 5.6*  ALBUMIN 2.4* 2.3* 2.5*   No results for input(s): LIPASE, AMYLASE in the last 168 hours. No results for input(s): AMMONIA in the last 168 hours. Coagulation Profile: No results for input(s): INR, PROTIME in the last 168 hours. Cardiac Enzymes: Recent Labs  Lab 08/26/20 0709  CKTOTAL 34*   BNP (last 3 results) No results for input(s): PROBNP in the last 8760 hours. HbA1C:  No results for input(s): HGBA1C in the last 72 hours. CBG: Recent Labs  Lab 08/25/20 1155 08/25/20 1614 08/25/20 2052 08/26/20 0740 08/26/20 1140  GLUCAP 126* 264* 217* 83 223*   Lipid Profile: No results for input(s): CHOL, HDL, LDLCALC, TRIG, CHOLHDL, LDLDIRECT in the last 72 hours. Thyroid Function Tests: No results for input(s): TSH, T4TOTAL, FREET4, T3FREE, THYROIDAB in the last 72 hours. Anemia Panel: No results for input(s): VITAMINB12, FOLATE, FERRITIN, TIBC, IRON, RETICCTPCT in the last 72 hours. Sepsis Labs: Recent Labs  Lab 08/20/20 0300  PROCALCITON <0.10    No results found for this or any previous visit (from the past 240 hour(s)).       Radiology Studies: DG Chest Port 1 View  Result Date: 08/26/2020 CLINICAL DATA:  Shortness of breath, cough, COVID pneumonia EXAM: PORTABLE CHEST 1 VIEW COMPARISON:  08/17/2020 FINDINGS: Low volume AP portable examination with slight interval increase in heterogeneous airspace opacity throughout the lungs, most conspicuous in the right midlung. The heart and mediastinum are  unremarkable. IMPRESSION: Low volume AP portable examination with slight interval increase in heterogeneous airspace opacity throughout the lungs, most conspicuous in the right midlung, generally in keeping with worsened COVID-19 airspace disease. Electronically Signed   By: Lauralyn Primes M.D.   On: 08/26/2020 08:21        Scheduled Meds: . vitamin C  500 mg Oral Daily  . aspirin  81 mg Oral Daily  . atorvastatin  40 mg Oral Daily  . baricitinib  4 mg Oral Daily  . benzonatate  200 mg Oral TID  . cholecalciferol  1,000 Units Oral Daily  . enoxaparin (LOVENOX) injection  40 mg Subcutaneous Q0600  . insulin aspart  0-20 Units Subcutaneous TID WC  . insulin aspart  0-5 Units Subcutaneous QHS  . insulin glargine  30 Units Subcutaneous Daily  . insulin starter kit- pen needles  1 kit Other Once  . living well with diabetes book   Does not apply Once  . mometasone-formoterol  2 puff Inhalation BID  . multivitamin with minerals  1 tablet Oral Daily  . polyethylene glycol  17 g Oral BID  . predniSONE  40 mg Oral Q breakfast  . zinc sulfate  220 mg Oral Daily   Continuous Infusions:   LOS: 11 days    Time spent: 36 minutes spent on chart review, discussion with nursing staff, consultants, updating family and interview/physical exam; more than 50% of that time was spent in counseling and/or coordination of care.    Alvira Philips [place], DO [facility] Available via Epic secure chat 7am-7pm After these hours, please refer to coverage provider listed on [email or web address] 08/26/2020, 3:59 PM

## 2020-08-26 NOTE — Progress Notes (Signed)
Physical Therapy Treatment Patient Details Name: Joshua Bean MRN: [id number] DOB: [birth year] Today's Date: 08/26/2020    History of Present Illness Pt is a 44 y.o. male with known COVID exposure (pt unvaccinated) admitted 08/14/20 with worsening SOB; workup for acute hypoxic respiratory failure secondary to COVID-19 PNA. PMH includes anxiety, DM2, HLD.   PT Comments    Pt progressing with mobility. Mobilizing well throughout room, although remains limited by decreased activity tolerance and desaturation with short activity bouts. SpO2 down to 80% on 6L O2 [place] with mobility; non-productive cough and DOE 3/4. Max encouragement and education on importance of more frequent mobility, including ambulating in room; pt reports understanding. Will continue to follow acutely.    Follow Up Recommendations  Home health PT     Equipment Recommendations  None recommended by PT    Recommendations for Other Services       Precautions / Restrictions Precautions Precautions: Fall;Other (comment) Precaution Comments: Watch SpO2; baseline low BP (pt reports SBP normally in 90s) Restrictions Weight Bearing Restrictions: No    Mobility  Bed Mobility Overal bed mobility: Independent                Transfers Overall transfer level: Independent Equipment used: None                Ambulation/Gait Ambulation/Gait assistance: Modified independent (Device/Increase time) Gait Distance (Feet): 120 Feet Assistive device: None Gait Pattern/deviations: Step-through pattern;Decreased stride length Gait velocity: Decreased   General Gait Details: Slow, guarded gait mod indep throughout room for increased time, ambulating 40' + 40' + 40' with prolonged seated rest breaks in between (pt declined hallway ambulation because not wanting to wear face mask due to SOB); pt with good management of SpO2 cord and HFNC line. SpO2 down to 80% on 6L O2, returning to >/86% with seated rest; frequent cough  and c/o dizziness. BP 97/72, up to 104/70   Stairs             Wheelchair Mobility    Modified Rankin (Stroke Patients Only)       Balance Overall balance assessment: Needs assistance Sitting-balance support: Feet supported;No upper extremity supported Sitting balance-Leahy Scale: Good     Standing balance support: During functional activity;No upper extremity supported Standing balance-Leahy Scale: Good                              Cognition Arousal/Alertness: Awake/alert Behavior During Therapy: Flat affect Overall Cognitive Status: Within Functional Limits for tasks assessed                                 General Comments: Generally flat affect; appreciated being given update regarding recent CXR and current medical status (pt stating, "I have medical experience but no one tells me anything")      Exercises Other Exercises Other Exercises: Repeated sit<>stand without UE support Other Exercises: Incentive spirometer x3 pulling ~1038mL (pt stopped due to significant coughing)    General Comments General comments (skin integrity, edema, etc.): Pt has been mobilizing independently to bathroom by removing O2 line. Max education on importance of more frequent in-room ambulation/standing activity; practiced and pt able to demonstrate good line management with lines attached to wall to simulate mobility without staff present      Pertinent Vitals/Pain Pain Assessment: No/denies pain    Home Living  Prior Function            PT Goals (current goals can now be found in the care plan section) Acute Rehab PT Goals Patient Stated Goal: Return home to kids PT Goal Formulation: With patient Time For Goal Achievement: 08/31/20 Potential to Achieve Goals: Good Progress towards PT goals: Progressing toward goals    Frequency           PT Plan Current plan remains appropriate    Co-evaluation               AM-PAC PT "6 Clicks" Mobility   Outcome Measure  Help needed turning from your back to your side while in a flat bed without using bedrails?: None Help needed moving from lying on your back to sitting on the side of a flat bed without using bedrails?: None Help needed moving to and from a bed to a chair (including a wheelchair)?: None Help needed standing up from a chair using your arms (e.g., wheelchair or bedside chair)?: None   Help needed climbing 3-5 steps with a railing? : A Little 6 Click Score: 19    End of Session Equipment Utilized During Treatment: Oxygen Activity Tolerance: Patient tolerated treatment well Patient left: in chair;with call bell/phone within reach Nurse Communication: Mobility status PT Visit Diagnosis: Unsteadiness on feet (R26.81);Muscle weakness (generalized) (M62.81);Difficulty in walking, not elsewhere classified (R26.2)     Time: 1859-0931 PT Time Calculation (min) (ACUTE ONLY): 21 min  Charges:  $Therapeutic Exercise: 8-22 mins                    Mabeline Caras, PT, DPT Acute Rehabilitation Services  Pager [phone] Office [place] 08/26/2020, 11:32 AM

## 2020-08-27 DIAGNOSIS — U071 COVID-19: Secondary | ICD-10-CM | POA: Diagnosis not present

## 2020-08-27 DIAGNOSIS — J1282 Pneumonia due to Coronavirus disease 2019: Secondary | ICD-10-CM | POA: Diagnosis not present

## 2020-08-27 LAB — GLUCOSE, CAPILLARY
Glucose-Capillary: 131 mg/dL — ABNORMAL HIGH (ref 70–99)
Glucose-Capillary: 149 mg/dL — ABNORMAL HIGH (ref 70–99)
Glucose-Capillary: 279 mg/dL — ABNORMAL HIGH (ref 70–99)
Glucose-Capillary: 246 mg/dL — ABNORMAL HIGH (ref 70–99)

## 2020-08-27 MED ORDER — PREDNISONE 20 MG PO TABS
30.0000 mg | ORAL_TABLET | Freq: Every day | ORAL | Status: DC
  Administered 2020-08-28 – 2020-08-30 (×3): 30 mg via ORAL
  Filled 2020-08-27 (×3): qty 1

## 2020-08-27 NOTE — Plan of Care (Signed)
Patient is currently resting in bed. Denies pain. OOB ambulating in room. VSS. Patient on 7L HFNC d/t stating 86-88% on 6L while sleeping. Call bell within reach. No issues overnight.   Problem: Coping: Goal: Psychosocial and spiritual needs will be supported Outcome: Progressing   Problem: Respiratory: Goal: Will maintain a patent airway Outcome: Progressing Goal: Complications related to the disease process, condition or treatment will be avoided or minimized Outcome: Progressing   Problem: Education: Goal: Knowledge of General Education information will improve Description: Including pain rating scale, medication(s)/side effects and non-pharmacologic comfort measures Outcome: Progressing   Problem: Health Behavior/Discharge Planning: Goal: Ability to manage health-related needs will improve Outcome: Progressing   Problem: Clinical Measurements: Goal: Ability to maintain clinical measurements within normal limits will improve Outcome: Progressing Goal: Will remain free from infection Outcome: Progressing Goal: Diagnostic test results will improve Outcome: Progressing Goal: Respiratory complications will improve Outcome: Progressing Goal: Cardiovascular complication will be avoided Outcome: Progressing   Problem: Activity: Goal: Risk for activity intolerance will decrease Outcome: Progressing   Problem: Nutrition: Goal: Adequate nutrition will be maintained Outcome: Progressing   Problem: Coping: Goal: Level of anxiety will decrease Outcome: Progressing   Problem: Elimination: Goal: Will not experience complications related to bowel motility Outcome: Progressing Goal: Will not experience complications related to urinary retention Outcome: Progressing   Problem: Pain Managment: Goal: General experience of comfort will improve Outcome: Progressing   Problem: Safety: Goal: Ability to remain free from injury will improve Outcome: Progressing   Problem: Skin  Integrity: Goal: Risk for impaired skin integrity will decrease Outcome: Progressing

## 2020-08-27 NOTE — Progress Notes (Signed)
PROGRESS NOTE    JAIMON LOFGREEN  FAO:[id number] DOB: Oct 21, 1976 DOA: 08/14/2020 PCP: Lorre Munroe, NP    Brief Narrative:  Joshua Bean is a 44 year old male with past medical history notable for anxiety, hyperlipidemia, type 2 diabetes mellitus who presented to the ED with progressive shortness of breath following a known Covid-19 exposure.  On arrival he was noted to have a SPO2 84% on room air and was tachycardic.  Patient also with associated fever, headache, nausea and poor oral intake.  In the ED, he was found to have a temperature of 102.9, tachycardic, SARS-CoV-2 PCR positive.  Hospitalist service was consulted for further evaluation and treatment of acute hypoxic respiratory failure secondary to Covid-19 viral pneumonia.   Assessment & Plan:   Principal Problem:   Pneumonia due to COVID-19 virus Active Problems:   Diabetes mellitus (HCC)   Anxiety and depression   Sepsis (HCC)   Acute respiratory failure with hypoxia (HCC)   Acute hypoxic respiratory failure secondary to acute Covid-19 viral pneumonia during the ongoing 2020/2021 Covid 19 Pandemic - POA Patient presenting to the ED with progressive shortness of breath, known recent Covid-19 exposure.  On arrival he was found to be hypoxic with SPO2 84% on room air.  Pain with heterogeneous airspace opacities bilaterally consistent with viral multifocal pneumonia. --COVID test: + 08/14/2020 --CRP 20.5>7.8>2.5>1.6>0.7>0.7><0.5 --ddimer 2.35>0.84>0.71>0.80>1.16>1.04>0.86>0.55 --Completed 5-day course of remdesivir 08/19/2020 --Baricitinib 4mg  PO daily (Day #13/14) --Continue prednisone 40 mg p.o. daily; decrease to 30mg  PO daily 10/15 --Mometasone-formoterol 200-5 mcg MDI 2 puffs twice daily --prone for 2-3hrs every 12hrs if able --Continue supplemental oxygen, titrate to maintain SPO2 greater than 92%, currently on 7L high flow nasal cannula with SPO2 95% --Continue supportive care with albuterol MDI prn, vitamin C, zinc,  Tylenol, antitussives (benzonatate/ Mucinex/Tussionex) --Follow CBC, CMP, D-dimer, ferritin, and CRP daily --Continue airborne/contact isolation precautions for 3 weeks from the day of diagnosis  The treatment plan and use of medications and known side effects were discussed with patient/family. Some of the medications used are based on case reports/anecdotal data.  All other medications being used in the management of COVID-19 based on limited study data.  Complete risks and long-term side effects are unknown, however in the best clinical judgment they seem to be of some benefit.  Patient wanted to proceed with treatment options provided.  Type 2 diabetes mellitus: Hemoglobin A1c 10.8, poorly controlled.  Not on medications at home. --Lantus 30 units subcutaneously daily --Resistance insulin sliding scale for further coverage --CBGs before every meal/at bedtime  Hyperlipidemia: Atorvastatin 40 mg p.o. daily  Anxiety: --Xanax 0.5 mg 3 times daily as needed   DVT prophylaxis: Lovenox Code Status: Full code Family Communication: Updated patient extensively at bedside  Disposition Plan:  Status is: Inpatient  Remains inpatient appropriate because:Ongoing diagnostic testing needed not appropriate for outpatient work up, Unsafe d/c plan, IV treatments appropriate due to intensity of illness or inability to take PO and Inpatient level of care appropriate due to severity of illness   Dispo: The patient is from: Home              Anticipated d/c is to: Home              Anticipated d/c date is: > 3 days              Patient currently is not medically stable to d/c.  Continues with high FiO2 requirements   Consultants:   None  Procedures:  None  Antimicrobials:   None   Subjective: Patient seen and examined bedside, resting comfortably in bed.  Continues with shortness of breath and on 7L high flow nasal cannula.  Patient overall feels slightly improved since yesterday.  No  other complaints or concerns at this time.  Denies headache, no visual changes, no chest pain, no palpitations, no nausea/vomiting/diarrhea, no fever/chills/night sweats, no weakness, no fatigue, no paresthesias.  No acute events overnight per nursing staff.  Objective: Vitals:   08/26/20 1957 08/26/20 2028 08/27/20 0431 08/27/20 1434  BP:  (!) 101/58 92/63 109/65  Pulse:  95 72 97  Resp:  18 18 18   Temp:  98.4 F (36.9 C) 98.1 F (36.7 C) 98.5 F (36.9 C)  TempSrc:  Oral Oral Oral  SpO2: 90% 90% 95% (!) 88%  Weight:      Height:        Intake/Output Summary (Last 24 hours) at 08/27/2020 1459 Last data filed at 08/27/2020 0432 Gross per 24 hour  Intake 240 ml  Output --  Net 240 ml   Filed Weights   08/14/20 2231  Weight: 95.3 kg    Examination:  General exam: Appears calm and comfortable  Respiratory system: Clear to auscultation. Respiratory effort normal.  On 7L high flow nasal cannula with SPO2 95% Cardiovascular system: S1 & S2 heard, RRR. No JVD, murmurs, rubs, gallops or clicks. No pedal edema. Gastrointestinal system: Abdomen is nondistended, soft and nontender. No organomegaly or masses felt. Normal bowel sounds heard. Central nervous system: Alert and oriented. No focal neurological deficits. Extremities: Symmetric 5 x 5 power. Skin: No rashes, lesions or ulcers Psychiatry: Judgement and insight appear normal. Mood & affect appropriate.     Data Reviewed: I have personally reviewed following labs and imaging studies  CBC: Recent Labs  Lab 08/21/20 0515 08/23/20 0500 08/26/20 0709  WBC 12.9* 11.3* 9.7  HGB 14.5 14.5 14.0  HCT 42.1 41.6 40.3  MCV 90.5 89.5 90.0  PLT 367 405* 496*   Basic Metabolic Panel: Recent Labs  Lab 08/21/20 0514 08/21/20 0515 08/22/20 0409 08/23/20 0500 08/26/20 0709  NA  --  137 137 136 137  K  --  3.4* 3.9 3.7 3.4*  CL  --  102 103 101 102  CO2  --  23 24 23 26   GLUCOSE  --  54* 60* 106* 96  BUN  --  14 16 12 13    CREATININE  --  0.66 0.60* 0.68 0.67  CALCIUM  --  8.3* 8.2* 8.5* 8.6*  MG 1.9  --   --   --   --    GFR: Estimated Creatinine Clearance: 140.3 mL/min (by C-G formula based on SCr of 0.67 mg/dL). Liver Function Tests: Recent Labs  Lab 08/23/20 0500 08/26/20 0709  AST 17 15  ALT 20 24  ALKPHOS 68 62  BILITOT 0.5 0.4  PROT 5.5* 5.6*  ALBUMIN 2.3* 2.5*   No results for input(s): LIPASE, AMYLASE in the last 168 hours. No results for input(s): AMMONIA in the last 168 hours. Coagulation Profile: No results for input(s): INR, PROTIME in the last 168 hours. Cardiac Enzymes: Recent Labs  Lab 08/26/20 0709  CKTOTAL 34*   BNP (last 3 results) No results for input(s): PROBNP in the last 8760 hours. HbA1C: No results for input(s): HGBA1C in the last 72 hours. CBG: Recent Labs  Lab 08/26/20 1140 08/26/20 1604 08/26/20 2026 08/27/20 0744 08/27/20 1147  GLUCAP 223* 255* 288* 131* 149*  Lipid Profile: No results for input(s): CHOL, HDL, LDLCALC, TRIG, CHOLHDL, LDLDIRECT in the last 72 hours. Thyroid Function Tests: No results for input(s): TSH, T4TOTAL, FREET4, T3FREE, THYROIDAB in the last 72 hours. Anemia Panel: No results for input(s): VITAMINB12, FOLATE, FERRITIN, TIBC, IRON, RETICCTPCT in the last 72 hours. Sepsis Labs: No results for input(s): PROCALCITON, LATICACIDVEN in the last 168 hours.  No results found for this or any previous visit (from the past 240 hour(s)).       Radiology Studies: DG Chest Port 1 View  Result Date: 08/26/2020 CLINICAL DATA:  Shortness of breath, cough, COVID pneumonia EXAM: PORTABLE CHEST 1 VIEW COMPARISON:  08/17/2020 FINDINGS: Low volume AP portable examination with slight interval increase in heterogeneous airspace opacity throughout the lungs, most conspicuous in the right midlung. The heart and mediastinum are unremarkable. IMPRESSION: Low volume AP portable examination with slight interval increase in heterogeneous airspace  opacity throughout the lungs, most conspicuous in the right midlung, generally in keeping with worsened COVID-19 airspace disease. Electronically Signed   By: Lauralyn Primes M.D.   On: 08/26/2020 08:21        Scheduled Meds: . vitamin C  500 mg Oral Daily  . aspirin  81 mg Oral Daily  . atorvastatin  40 mg Oral Daily  . baricitinib  4 mg Oral Daily  . benzonatate  200 mg Oral TID  . cholecalciferol  1,000 Units Oral Daily  . enoxaparin (LOVENOX) injection  40 mg Subcutaneous Q0600  . insulin aspart  0-20 Units Subcutaneous TID WC  . insulin aspart  0-5 Units Subcutaneous QHS  . insulin glargine  30 Units Subcutaneous Daily  . insulin starter kit- pen needles  1 kit Other Once  . living well with diabetes book   Does not apply Once  . mometasone-formoterol  2 puff Inhalation BID  . multivitamin with minerals  1 tablet Oral Daily  . polyethylene glycol  17 g Oral BID  . predniSONE  40 mg Oral Q breakfast  . zinc sulfate  220 mg Oral Daily   Continuous Infusions:   LOS: 12 days    Time spent: 36 minutes spent on chart review, discussion with nursing staff, consultants, updating family and interview/physical exam; more than 50% of that time was spent in counseling and/or coordination of care.    Alvira Philips [place], DO [facility] Available via Epic secure chat 7am-7pm After these hours, please refer to coverage provider listed on [email or web address] 08/27/2020, 2:59 PM

## 2020-08-28 LAB — GLUCOSE, CAPILLARY
Glucose-Capillary: 126 mg/dL — ABNORMAL HIGH (ref 70–99)
Glucose-Capillary: 171 mg/dL — ABNORMAL HIGH (ref 70–99)
Glucose-Capillary: 211 mg/dL — ABNORMAL HIGH (ref 70–99)
Glucose-Capillary: 270 mg/dL — ABNORMAL HIGH (ref 70–99)

## 2020-08-28 NOTE — Progress Notes (Signed)
PROGRESS NOTE    Joshua Bean  AVW:[id number] DOB: 1976-03-22 DOA: 08/14/2020 PCP: Lorre Munroe, NP    Brief Narrative:  Joshua Bean is a 44 year old male with past medical history notable for anxiety, hyperlipidemia, type 2 diabetes mellitus who presented to the ED with progressive shortness of breath following a known Covid-19 exposure.  On arrival he was noted to have a SPO2 84% on room air and was tachycardic.  Patient also with associated fever, headache, nausea and poor oral intake.  In the ED, he was found to have a temperature of 102.9, tachycardic, SARS-CoV-2 PCR positive.  Hospitalist service was consulted for further evaluation and treatment of acute hypoxic respiratory failure secondary to Covid-19 viral pneumonia.   Assessment & Plan:   Principal Problem:   Pneumonia due to COVID-19 virus Active Problems:   Diabetes mellitus (HCC)   Anxiety and depression   Sepsis (HCC)   Acute respiratory failure with hypoxia (HCC)   Acute hypoxic respiratory failure secondary to acute Covid-19 viral pneumonia during the ongoing 2020/2021 Covid 19 Pandemic - POA Patient presenting to the ED with progressive shortness of breath, known recent Covid-19 exposure.  On arrival he was found to be hypoxic with SPO2 84% on room air.  Pain with heterogeneous airspace opacities bilaterally consistent with viral multifocal pneumonia. --COVID test: + 08/14/2020 --CRP 20.5>7.8>2.5>1.6>0.7>0.7><0.5 --ddimer 2.35>0.84>0.71>0.80>1.16>1.04>0.86>0.55 --Completed 5-day course of remdesivir 08/19/2020 --Baricitinib 4mg  PO daily (Day #13/14) --Continue prednisone 30 mg p.o. daily --Mometasone-formoterol 200-5 mcg MDI 2 puffs twice daily --prone for 2-3hrs every 12hrs if able --Continue supplemental oxygen, titrate to maintain SPO2 greater than 92%, currently on room air with SPO2 88% --Ambulatory O2 evaluation today --Continue supportive care with albuterol MDI prn, vitamin C, zinc, Tylenol,  antitussives (benzonatate/ Mucinex/Tussionex) --Continue airborne/contact isolation precautions for 3 weeks from the day of diagnosis  The treatment plan and use of medications and known side effects were discussed with patient/family. Some of the medications used are based on case reports/anecdotal data.  All other medications being used in the management of COVID-19 based on limited study data.  Complete risks and long-term side effects are unknown, however in the best clinical judgment they seem to be of some benefit.  Patient wanted to proceed with treatment options provided.  Type 2 diabetes mellitus: Hemoglobin A1c 10.8, poorly controlled.  Not on medications at home. --Lantus 30 units subcutaneously daily --Resistance insulin sliding scale for further coverage --CBGs before every meal/at bedtime  Hyperlipidemia: Atorvastatin 40 mg p.o. daily  Anxiety: --Xanax 0.5 mg 3 times daily as needed   DVT prophylaxis: Lovenox Code Status: Full code Family Communication: Updated patient extensively at bedside  Disposition Plan:  Status is: Inpatient  Remains inpatient appropriate because:Ongoing diagnostic testing needed not appropriate for outpatient work up, Unsafe d/c plan, IV treatments appropriate due to intensity of illness or inability to take PO and Inpatient level of care appropriate due to severity of illness   Dispo: The patient is from: Home              Anticipated d/c is to: Home              Anticipated d/c date is: 1 day              Patient currently is not medically stable to d/c.     Consultants:   None  Procedures:   None  Antimicrobials:   None   Subjective: Patient seen and examined bedside, resting comfortably in bed.  Continues with cough and conversational dyspnea.  Now weaned off of supple oxygen today with low SPO2 of 88%.  Discussed extensively regarding his diabetes and likely management with insulin outpatient.  No other complaints or concerns  at this time.  Hopeful for discharge home tomorrow.  Denies headache, no visual changes, no chest pain, no palpitations, no fever/chills/night sweats, no nausea/vomiting/diarrhea, no weakness, no fatigue, no paresthesias.  No acute events overnight per nursing staff.  Objective: Vitals:   08/28/20 0400 08/28/20 0448 08/28/20 0614 08/28/20 0900  BP:  92/68    Pulse: 71 73 79   Resp:  20    Temp:  98.3 F (36.8 C)    TempSrc:  Oral    SpO2: 93% 94% (!) 88% 91%  Weight:      Height:        Intake/Output Summary (Last 24 hours) at 08/28/2020 1340 Last data filed at 08/28/2020 1100 Gross per 24 hour  Intake 720 ml  Output 650 ml  Net 70 ml   Filed Weights   08/14/20 2231  Weight: 95.3 kg    Examination:  General exam: Appears calm and comfortable  Respiratory system: Clear to auscultation. Respiratory effort normal.  On room air with SPO2 88%. Cardiovascular system: S1 & S2 heard, RRR. No JVD, murmurs, rubs, gallops or clicks. No pedal edema. Gastrointestinal system: Abdomen is nondistended, soft and nontender. No organomegaly or masses felt. Normal bowel sounds heard. Central nervous system: Alert and oriented. No focal neurological deficits. Extremities: Symmetric 5 x 5 power. Skin: No rashes, lesions or ulcers Psychiatry: Judgement and insight appear normal. Mood & affect appropriate.     Data Reviewed: I have personally reviewed following labs and imaging studies  CBC: Recent Labs  Lab 08/23/20 0500 08/26/20 0709  WBC 11.3* 9.7  HGB 14.5 14.0  HCT 41.6 40.3  MCV 89.5 90.0  PLT 405* 496*   Basic Metabolic Panel: Recent Labs  Lab 08/22/20 0409 08/23/20 0500 08/26/20 0709  NA 137 136 137  K 3.9 3.7 3.4*  CL 103 101 102  CO2 24 23 26   GLUCOSE 60* 106* 96  BUN 16 12 13   CREATININE 0.60* 0.68 0.67  CALCIUM 8.2* 8.5* 8.6*   GFR: Estimated Creatinine Clearance: 140.3 mL/min (by C-G formula based on SCr of 0.67 mg/dL). Liver Function Tests: Recent Labs    Lab 08/23/20 0500 08/26/20 0709  AST 17 15  ALT 20 24  ALKPHOS 68 62  BILITOT 0.5 0.4  PROT 5.5* 5.6*  ALBUMIN 2.3* 2.5*   No results for input(s): LIPASE, AMYLASE in the last 168 hours. No results for input(s): AMMONIA in the last 168 hours. Coagulation Profile: No results for input(s): INR, PROTIME in the last 168 hours. Cardiac Enzymes: Recent Labs  Lab 08/26/20 0709  CKTOTAL 34*   BNP (last 3 results) No results for input(s): PROBNP in the last 8760 hours. HbA1C: No results for input(s): HGBA1C in the last 72 hours. CBG: Recent Labs  Lab 08/27/20 1147 08/27/20 1624 08/27/20 2117 08/28/20 0725 08/28/20 1138  GLUCAP 149* 279* 246* 126* 171*   Lipid Profile: No results for input(s): CHOL, HDL, LDLCALC, TRIG, CHOLHDL, LDLDIRECT in the last 72 hours. Thyroid Function Tests: No results for input(s): TSH, T4TOTAL, FREET4, T3FREE, THYROIDAB in the last 72 hours. Anemia Panel: No results for input(s): VITAMINB12, FOLATE, FERRITIN, TIBC, IRON, RETICCTPCT in the last 72 hours. Sepsis Labs: No results for input(s): PROCALCITON, LATICACIDVEN in the last 168 hours.  No results found  for this or any previous visit (from the past 240 hour(s)).       Radiology Studies: No results found.      Scheduled Meds: . vitamin C  500 mg Oral Daily  . aspirin  81 mg Oral Daily  . atorvastatin  40 mg Oral Daily  . benzonatate  200 mg Oral TID  . cholecalciferol  1,000 Units Oral Daily  . enoxaparin (LOVENOX) injection  40 mg Subcutaneous Q0600  . insulin aspart  0-20 Units Subcutaneous TID WC  . insulin aspart  0-5 Units Subcutaneous QHS  . insulin glargine  30 Units Subcutaneous Daily  . insulin starter kit- pen needles  1 kit Other Once  . living well with diabetes book   Does not apply Once  . mometasone-formoterol  2 puff Inhalation BID  . multivitamin with minerals  1 tablet Oral Daily  . polyethylene glycol  17 g Oral BID  . predniSONE  30 mg Oral Q breakfast  .  zinc sulfate  220 mg Oral Daily   Continuous Infusions:   LOS: 13 days    Time spent: 35 minutes spent on chart review, discussion with nursing staff, consultants, updating family and interview/physical exam; more than 50% of that time was spent in counseling and/or coordination of care.    Alvira Philips [place], DO [facility] Available via Epic secure chat 7am-7pm After these hours, please refer to coverage provider listed on [email or web address] 08/28/2020, 1:40 PM

## 2020-08-28 NOTE — TOC Progression Note (Signed)
Transition of Care [facility]) - Progression Note    Patient Details  Name: Joshua Bean MRN: [id number] Date of Birth: [birth year]  Transition of Care [facility] CM/SW [place], RN Phone Number: 08/28/2020, 3:15 PM  Clinical Narrative:    Making some progress, on 6L of oxygen while ambulating, newly diagnosed diabetic. Diabetes coordinator consulted. Is insured [place]. Will likely need oxygen. EDD projected by MD at 10/17. titration down oxygen slowly. Needs while ambulating. Will need oxygen qualifications prior to DC. Messaged RN for these.    Expected Discharge Plan: Home/Self Care Barriers to Discharge: No Barriers Identified  Expected Discharge Plan and Services Expected Discharge Plan: Home/Self Care In-house Referral: NA Discharge Planning Services: NA Post Acute Care Choice: NA Living arrangements for the past 2 months: Single Family Home                   DME Agency: NA       HH Arranged: NA           Social Determinants of Health (SDOH) Interventions    Readmission Risk Interventions No flowsheet data found.

## 2020-08-28 NOTE — Progress Notes (Signed)
Physical Therapy Treatment Patient Details Name: Joshua Bean MRN: [id number] DOB: 1976/04/08 Today's Date: 08/28/2020    History of Present Illness Pt is a 44 y.o. male with known COVID exposure (pt unvaccinated) admitted 08/14/20 with worsening SOB; workup for acute hypoxic respiratory failure secondary to COVID-19 PNA. PMH includes anxiety, DM2, HLD.   PT Comments    Pt progressing well with mobility, remains limited by desaturation with activity. SpO2 85-89% on RA at rest, SpO2 down to 84% on 6L O2 [place] but unreliable pleth and unable to get accurate reading (tried both ear lobe and finger probe). DOE 3/4 with nonproductive cough, pt fatigued, but ultimately demonstrates improving activity tolerance. Will require additional oxygen saturations qualification check prior to d/c.    Follow Up Recommendations  Home health PT     Equipment Recommendations  None recommended by PT    Recommendations for Other Services       Precautions / Restrictions Precautions Precautions: Fall;Other (comment) Precaution Comments: Watch SpO2; baseline low BP (pt reports SBP normally in 90s) Restrictions Weight Bearing Restrictions: No    Mobility  Bed Mobility Overal bed mobility: Independent                Transfers Overall transfer level: Independent Equipment used: None                Ambulation/Gait Ambulation/Gait assistance: Supervision Gait Distance (Feet): 160 Feet (+240) Assistive device: None Gait Pattern/deviations: Step-through pattern;Decreased stride length Gait velocity: Decreased   General Gait Details: Slow, guarded gait, managing O2 tank, supervision for safety with lines and balance; 2x self-corrected instability. Prolonged seated rest break between gait trials. Difficulty getting reliable pleth with ambulation, titrated up to 6L with SpO2 84% poor pleth   Stairs             Wheelchair Mobility    Modified Rankin (Stroke Patients Only)        Balance Overall balance assessment: Needs assistance Sitting-balance support: Feet supported;No upper extremity supported Sitting balance-Leahy Scale: Good     Standing balance support: During functional activity;No upper extremity supported Standing balance-Leahy Scale: Good                              Cognition Arousal/Alertness: Awake/alert Behavior During Therapy: WFL for tasks assessed/performed Overall Cognitive Status: Within Functional Limits for tasks assessed                                 General Comments: Improved affect this session      Exercises      General Comments General comments (skin integrity, edema, etc.): Pt reports likely d/c tomorrow. Educ re: current condition, O2 needs, activity recommendations (daily walking program), energy conservation strategies (handout provided)      Pertinent Vitals/Pain Pain Assessment: No/denies pain    Home Living                      Prior Function            PT Goals (current goals can now be found in the care plan section) Progress towards PT goals: Progressing toward goals    Frequency    Min 3X/week      PT Plan Current plan remains appropriate    Co-evaluation              AM-PAC PT "  6 Clicks" Mobility   Outcome Measure  Help needed turning from your back to your side while in a flat bed without using bedrails?: None Help needed moving from lying on your back to sitting on the side of a flat bed without using bedrails?: None Help needed moving to and from a bed to a chair (including a wheelchair)?: None Help needed standing up from a chair using your arms (e.g., wheelchair or bedside chair)?: None Help needed to walk in hospital room?: None Help needed climbing 3-5 steps with a railing? : A Little 6 Click Score: 23    End of Session Equipment Utilized During Treatment: Oxygen Activity Tolerance: Patient tolerated treatment well Patient left: with  call bell/phone within reach;in bed (seated EOB) Nurse Communication: Mobility status PT Visit Diagnosis: Unsteadiness on feet (R26.81);Muscle weakness (generalized) (M62.81);Difficulty in walking, not elsewhere classified (R26.2)     Time: 0370-4888 PT Time Calculation (min) (ACUTE ONLY): 32 min  Charges:  $Therapeutic Exercise: 23-37 mins                     Mabeline Caras, PT, DPT Acute Rehabilitation Services  Pager [phone] Office [place] 08/28/2020, 1:58 PM

## 2020-08-29 LAB — GLUCOSE, CAPILLARY
Glucose-Capillary: 80 mg/dL (ref 70–99)
Glucose-Capillary: 224 mg/dL — ABNORMAL HIGH (ref 70–99)
Glucose-Capillary: 228 mg/dL — ABNORMAL HIGH (ref 70–99)
Glucose-Capillary: 256 mg/dL — ABNORMAL HIGH (ref 70–99)
Glucose-Capillary: 152 mg/dL — ABNORMAL HIGH (ref 70–99)

## 2020-08-29 MED ORDER — FLUTICASONE PROPIONATE 50 MCG/ACT NA SUSP
2.0000 | Freq: Every day | NASAL | Status: DC
  Administered 2020-08-29 – 2020-08-30 (×2): 2 via NASAL
  Filled 2020-08-29: qty 16

## 2020-08-29 NOTE — Progress Notes (Signed)
SATURATION QUALIFICATIONS: (This note is used to comply with regulatory documentation for home oxygen)  Patient Saturations on Room Air at Rest = 85%  Patient Saturations on Room Air while Ambulating = 82%  Patient Saturations on 4 Liters of oxygen while Ambulating = 88%  Please briefly explain why patient needs home oxygen:  Patient's O2 drops to 82% without O2

## 2020-08-29 NOTE — TOC Initial Note (Addendum)
Transition of Care [facility]) - Initial/Assessment Note    Patient Details  Name: Joshua Bean MRN: [id number] Date of Birth: 25-Apr-1976  Transition of [facility]) CM/SW Contact:    Carles Collet, RN Phone Number: 08/29/2020, 2:15 PM  Clinical Narrative:                  Patient from home alone, dx w COVID 10/1. Will DC to home  Will need home O2 referral made to [facility] for delivery to unit and home after DC. CM setting up [place]. [place] declined [facility]- accepted   Expected Discharge Plan: [place] Barriers to Discharge: Continued Medical Work up   Patient Goals and CMS Choice     Choice offered to / list presented to : NA  Expected Discharge Plan and Services Expected Discharge Plan: [place] In-house Referral: NA Discharge Planning Services: NA Post Acute Care Choice: NA Living arrangements for the past 2 months: Single Family Home                   DME Agency:  [facility] Aver) Date DME Agency Contacted: 08/29/20 Time DME Agency Contacted: 92 Representative spoke with at DME Agency: Brenton Grills HH Arranged: PT, RN          Prior Living Arrangements/Services Living arrangements for the past 2 months: Single Family Home Lives with:: Self                   Activities of Daily Living Home Assistive Devices/Equipment: Eyeglasses, CBG Meter ADL Screening (condition at time of admission) Patient's cognitive ability adequate to safely complete daily activities?: No Is the patient deaf or have difficulty hearing?: No Does the patient have difficulty seeing, even when wearing glasses/contacts?: No Does the patient have difficulty concentrating, remembering, or making decisions?: No Patient able to express need for assistance with ADLs?: Yes Does the patient have difficulty dressing or bathing?: No Independently performs ADLs?: Yes (appropriate for developmental age) Does the patient have difficulty walking or climbing stairs?:  No Weakness of Legs: Both (st times) Weakness of Arms/Hands: Both (at times)  Permission Sought/Granted                  Emotional Assessment         Alcohol / Substance Use: Not Applicable Psych Involvement: No (comment)  Admission diagnosis:  Hyperglycemia [R73.9] Acute respiratory failure with hypoxia ([place]) [J96.01] Pneumonia due to COVID-19 virus [U07.1, J12.82] Patient Active Problem List   Diagnosis Date Noted  . Pneumonia due to COVID-19 virus 08/15/2020  . Sepsis ([place]) 08/15/2020  . Acute respiratory failure with hypoxia ([place]) 08/15/2020  . Idiopathic gout of ankle 06/04/2020  . Erectile dysfunction 06/04/2020  . Anxiety and depression 06/04/2020  . Diabetes mellitus ([place]) 06/07/2016  . HLD (hyperlipidemia) 11/03/2014   PCP:  Jearld Fenton, NP Pharmacy:   [id number] [place], [place] [address] Phone: [phone] Fax: [phone]     Social Determinants of Health (SDOH) Interventions    Readmission Risk Interventions No flowsheet data found.

## 2020-08-29 NOTE — Progress Notes (Signed)
PROGRESS NOTE    Joshua Bean  ZOX:[id number] DOB: 10/23/1976 DOA: 08/14/2020 PCP: Joshua Munroe, NP    Brief Narrative:  Joshua Bean is a 44 year old male with past medical history notable for anxiety, hyperlipidemia, type 2 diabetes mellitus who presented to the ED with progressive shortness of breath following a known Covid-19 exposure.  On arrival he was noted to have a SPO2 84% on room air and was tachycardic.  Patient also with associated fever, headache, nausea and poor oral intake.  In the ED, he was found to have a temperature of 102.9, tachycardic, SARS-CoV-2 PCR positive.  Hospitalist service was consulted for further evaluation and treatment of acute hypoxic respiratory failure secondary to Covid-19 viral pneumonia.   Assessment & Plan:   Principal Problem:   Pneumonia due to COVID-19 virus Active Problems:   Diabetes mellitus (HCC)   Anxiety and depression   Sepsis (HCC)   Acute respiratory failure with hypoxia (HCC)   Acute hypoxic respiratory failure secondary to acute Covid-19 viral pneumonia during the ongoing 2020/2021 Covid 19 Pandemic - POA Patient presenting to the ED with progressive shortness of breath, known recent Covid-19 exposure.  On arrival he was found to be hypoxic with SPO2 84% on room air.  Pain with heterogeneous airspace opacities bilaterally consistent with viral multifocal pneumonia. --COVID test: + 08/14/2020 --CRP 20.5>7.8>2.5>1.6>0.7>0.7><0.5 --ddimer 2.35>0.84>0.71>0.80>1.16>1.04>0.86>0.55 --Completed 5-day course of remdesivir 08/19/2020 --Baricitinib 4mg  PO daily (Day #13/14) --Continue prednisone 30 mg p.o. daily --Mometasone-formoterol 200-5 mcg MDI 2 puffs twice daily --prone for 2-3hrs every 12hrs if able --Continue supplemental oxygen, titrate to maintain SPO2 greater than 92%, currently on room air with SPO2 88% --Ambulatory O2 evaluation today --Continue supportive care with albuterol MDI prn, vitamin C, zinc, Tylenol,  antitussives (benzonatate/ Mucinex/Tussionex) --Continue airborne/contact isolation precautions for 3 weeks from the day of diagnosis --Home O2 evaluation today with oxygen requirement of 4 L with ambulation to maintain SPO2 greater than 88%.  The treatment plan and use of medications and known side effects were discussed with patient/family. Some of the medications used are based on case reports/anecdotal data.  All other medications being used in the management of COVID-19 based on limited study data.  Complete risks and long-term side effects are unknown, however in the best clinical judgment they seem to be of some benefit.  Patient wanted to proceed with treatment options provided.  Type 2 diabetes mellitus: Hemoglobin A1c 10.8, poorly controlled.  Not on medications at home. --Lantus 30 units subcutaneously daily --Resistance insulin sliding scale for further coverage --CBGs before every meal/at bedtime  Hyperlipidemia: Atorvastatin 40 mg p.o. daily  Anxiety: --Xanax 0.5 mg 3 times daily as needed   DVT prophylaxis: Lovenox Code Status: Full code Family Communication: Updated patient extensively at bedside  Disposition Plan:  Status is: Inpatient  Remains inpatient appropriate because:Unsafe d/c plan and Inpatient level of care appropriate due to severity of illness   Dispo: The patient is from: Home              Anticipated d/c is to: Home              Anticipated d/c date is: 1 day              Patient currently is not medically stable to d/c.  Plan discharge home with home health RN/PT and home O2 on 08/30/2020   Consultants:   None  Procedures:   None  Antimicrobials:   None   Subjective: Patient seen and  examined bedside, resting comfortably in bed.  Currently off oxygen but SPO2 84%.  Continues with occasional dizziness.  No other complaints or concerns at this time. Denies headache, no visual changes, no chest pain, no palpitations, no fever/chills/night  sweats, no nausea/vomiting/diarrhea, no weakness, no fatigue, no paresthesias.  No acute events overnight per nursing staff.  Objective: Vitals:   08/28/20 0900 08/28/20 1500 08/28/20 2012 08/29/20 0456  BP:  98/71 (!) 91/58 98/66  Pulse:  94 92 69  Resp:  20 18 18   Temp:  98 F (36.7 C) 98.3 F (36.8 C) 98.1 F (36.7 C)  TempSrc:  Oral Oral Oral  SpO2: 91% 90% 91% 98%  Weight:      Height:        Intake/Output Summary (Last 24 hours) at 08/29/2020 1035 Last data filed at 08/29/2020 0445 Gross per 24 hour  Intake 960 ml  Output 500 ml  Net 460 ml   Filed Weights   08/14/20 2231  Weight: 95.3 kg    Examination:  General exam: Appears calm and comfortable  Respiratory system: Clear to auscultation. Respiratory effort normal.  On room air with SPO2 84%; placed back on nasal cannula Cardiovascular system: S1 & S2 heard, RRR. No JVD, murmurs, rubs, gallops or clicks. No pedal edema. Gastrointestinal system: Abdomen is nondistended, soft and nontender. No organomegaly or masses felt. Normal bowel sounds heard. Central nervous system: Alert and oriented. No focal neurological deficits. Extremities: Symmetric 5 x 5 power. Skin: No rashes, lesions or ulcers Psychiatry: Judgement and insight appear normal. Mood & affect appropriate.     Data Reviewed: I have personally reviewed following labs and imaging studies  CBC: Recent Labs  Lab 08/23/20 0500 08/26/20 0709  WBC 11.3* 9.7  HGB 14.5 14.0  HCT 41.6 40.3  MCV 89.5 90.0  PLT 405* 496*   Basic Metabolic Panel: Recent Labs  Lab 08/23/20 0500 08/26/20 0709  NA 136 137  K 3.7 3.4*  CL 101 102  CO2 23 26  GLUCOSE 106* 96  BUN 12 13  CREATININE 0.68 0.67  CALCIUM 8.5* 8.6*   GFR: Estimated Creatinine Clearance: 140.3 mL/min (by C-G formula based on SCr of 0.67 mg/dL). Liver Function Tests: Recent Labs  Lab 08/23/20 0500 08/26/20 0709  AST 17 15  ALT 20 24  ALKPHOS 68 62  BILITOT 0.5 0.4  PROT 5.5*  5.6*  ALBUMIN 2.3* 2.5*   No results for input(s): LIPASE, AMYLASE in the last 168 hours. No results for input(s): AMMONIA in the last 168 hours. Coagulation Profile: No results for input(s): INR, PROTIME in the last 168 hours. Cardiac Enzymes: Recent Labs  Lab 08/26/20 0709  CKTOTAL 34*   BNP (last 3 results) No results for input(s): PROBNP in the last 8760 hours. HbA1C: No results for input(s): HGBA1C in the last 72 hours. CBG: Recent Labs  Lab 08/28/20 0725 08/28/20 1138 08/28/20 1647 08/28/20 2133 08/29/20 0728  GLUCAP 126* 171* 211* 270* 80   Lipid Profile: No results for input(s): CHOL, HDL, LDLCALC, TRIG, CHOLHDL, LDLDIRECT in the last 72 hours. Thyroid Function Tests: No results for input(s): TSH, T4TOTAL, FREET4, T3FREE, THYROIDAB in the last 72 hours. Anemia Panel: No results for input(s): VITAMINB12, FOLATE, FERRITIN, TIBC, IRON, RETICCTPCT in the last 72 hours. Sepsis Labs: No results for input(s): PROCALCITON, LATICACIDVEN in the last 168 hours.  No results found for this or any previous visit (from the past 240 hour(s)).       Radiology Studies: No  results found.      Scheduled Meds: . vitamin C  500 mg Oral Daily  . aspirin  81 mg Oral Daily  . atorvastatin  40 mg Oral Daily  . benzonatate  200 mg Oral TID  . cholecalciferol  1,000 Units Oral Daily  . enoxaparin (LOVENOX) injection  40 mg Subcutaneous Q0600  . fluticasone  2 spray Each Nare Daily  . insulin aspart  0-20 Units Subcutaneous TID WC  . insulin aspart  0-5 Units Subcutaneous QHS  . insulin glargine  30 Units Subcutaneous Daily  . insulin starter kit- pen needles  1 kit Other Once  . living well with diabetes book   Does not apply Once  . mometasone-formoterol  2 puff Inhalation BID  . multivitamin with minerals  1 tablet Oral Daily  . polyethylene glycol  17 g Oral BID  . predniSONE  30 mg Oral Q breakfast  . zinc sulfate  220 mg Oral Daily   Continuous Infusions:   LOS:  14 days    Time spent: 35 minutes spent on chart review, discussion with nursing staff, consultants, updating family and interview/physical exam; more than 50% of that time was spent in counseling and/or coordination of care.    Alvira Philips [place], DO [facility] Available via Epic secure chat 7am-7pm After these hours, please refer to coverage provider listed on [email or web address] 08/29/2020, 10:35 AM

## 2020-08-30 LAB — GLUCOSE, CAPILLARY
Glucose-Capillary: 118 mg/dL — ABNORMAL HIGH (ref 70–99)
Glucose-Capillary: 233 mg/dL — ABNORMAL HIGH (ref 70–99)

## 2020-08-30 MED ORDER — INSULIN LISPRO 200 UNIT/ML ~~LOC~~ SOPN: 2.0000 [IU] | PEN_INJECTOR | Freq: Three times a day (TID) | SUBCUTANEOUS | 0 refills | Status: DC

## 2020-08-30 MED ORDER — METFORMIN HCL 500 MG PO TABS: 500.0000 mg | ORAL_TABLET | Freq: Two times a day (BID) | ORAL | 0 refills | Status: DC

## 2020-08-30 MED ORDER — ASCORBIC ACID 500 MG PO TABS: 500.0000 mg | ORAL_TABLET | Freq: Every day | ORAL | 0 refills | Status: DC

## 2020-08-30 MED ORDER — MOMETASONE FURO-FORMOTEROL FUM 200-5 MCG/ACT IN AERO: 2.0000 | INHALATION_SPRAY | Freq: Two times a day (BID) | RESPIRATORY_TRACT | 0 refills | Status: DC

## 2020-08-30 MED ORDER — ASPIRIN 81 MG PO CHEW: 81.0000 mg | CHEWABLE_TABLET | Freq: Every day | ORAL | 0 refills | Status: DC

## 2020-08-30 MED ORDER — PREDNISONE 10 MG PO TABS: ORAL_TABLET | ORAL | 0 refills | Status: DC

## 2020-08-30 MED ORDER — ATORVASTATIN CALCIUM 40 MG PO TABS: 40.0000 mg | ORAL_TABLET | Freq: Every day | ORAL | 0 refills | Status: DC

## 2020-08-30 MED ORDER — "PEN NEEDLES 3/16"" 31G X 5 MM MISC": 2 refills | Status: AC

## 2020-08-30 MED ORDER — FLUTICASONE PROPIONATE 50 MCG/ACT NA SUSP: 2.0000 | Freq: Every day | NASAL | 0 refills | Status: AC

## 2020-08-30 MED ORDER — ALBUTEROL SULFATE HFA 108 (90 BASE) MCG/ACT IN AERS: 2.0000 | INHALATION_SPRAY | Freq: Four times a day (QID) | RESPIRATORY_TRACT | 0 refills | Status: DC | PRN

## 2020-08-30 MED ORDER — INSULIN GLARGINE 100 UNIT/ML SOLOSTAR PEN: 30.0000 [IU] | PEN_INJECTOR | Freq: Every day | SUBCUTANEOUS | 0 refills | Status: DC

## 2020-08-30 MED ORDER — GUAIFENESIN-DM 100-10 MG/5ML PO SYRP: 10.0000 mL | ORAL_SOLUTION | ORAL | 0 refills | Status: DC | PRN

## 2020-08-30 MED ORDER — ZINC SULFATE 220 (50 ZN) MG PO CAPS: 220.0000 mg | ORAL_CAPSULE | Freq: Every day | ORAL | 0 refills | Status: DC

## 2020-08-30 NOTE — Progress Notes (Signed)
When RN entered room, patient sating 80% on room air while attempting to eat breakfast. Patient placed back on 4L  with saturations 86-88%. Will continue to monitor.  Hiram Comber, RN 08/30/2020 8:43 AM

## 2020-08-30 NOTE — Progress Notes (Signed)
Education provided about insulin administration and glucose management. Patient able to demonstrate proper use of insulin pen and ability to safely administer insulin. Patient demonstrated ability to properly check blood glucose with our facility equipment.

## 2020-08-30 NOTE — Progress Notes (Signed)
Patient ambulated 150 feet in hallway on 6L [place]. Patient's saturations maintained around 83% however patient looked and felt well during the walk. Patient denies feeling lightheaded or dizzy while/after ambulating. Patient able to maintain same pace throughout unit without having to stop to catch his breath which pt states is an improvement for him. Pt states he feels ready to go home. Dr. [place] paged to notify.

## 2020-08-30 NOTE — Discharge Instructions (Signed)
10 Things You Can Do to Manage Your COVID-19 Symptoms at Home If you have possible or confirmed COVID-19: 1. Stay home from work and school. And stay away from other public places. If you must go out, avoid using any kind of public transportation, ridesharing, or taxis. 2. Monitor your symptoms carefully. If your symptoms get worse, call your healthcare provider immediately. 3. Get rest and stay hydrated. 4. If you have a medical appointment, call the healthcare provider ahead of time and tell them that you have or may have COVID-19. 5. For medical emergencies, call 911 and notify the dispatch personnel that you have or may have COVID-19. 6. Cover your cough and sneezes with a tissue or use the inside of your elbow. 7. Wash your hands often with soap and water for at least 20 seconds or clean your hands with an alcohol-based hand sanitizer that contains at least 60% alcohol. 8. As much as possible, stay in a specific room and away from other people in your home. Also, you should use a separate bathroom, if available. If you need to be around other people in or outside of the home, wear a mask. 9. Avoid sharing personal items with other people in your household, like dishes, towels, and bedding. 10. Clean all surfaces that are touched often, like counters, tabletops, and doorknobs. Use household cleaning sprays or wipes according to the label instructions. [email or web address] 05/15/2019 This information is not intended to replace advice given to you by your health care provider. Make sure you discuss any questions you have with your health care provider. Document Revised: 10/17/2019 Document Reviewed: 10/17/2019 Elsevier Patient Education  [place].   COVID-19 Frequently Asked Questions COVID-19 (coronavirus disease) is an infection that is caused by a large family of viruses. Some viruses cause illness in people and others cause illness in animals like camels, cats, and bats. In some  cases, the viruses that cause illness in animals can spread to humans. Where did the coronavirus come from? In December 2019, [place] told the [facility]) of several cases of lung disease (human respiratory illness). These cases were linked to an open seafood and livestock market in the city of [place]. The link to the seafood and livestock market suggests that the virus may have spread from animals to humans. However, since that first outbreak in December, the virus has also been shown to spread from person to person. What is the name of the disease and the virus? Disease name Early on, this disease was called novel coronavirus. This is because scientists determined that the disease was caused by a new (novel) respiratory virus. The World Health Organization [facility] has now named the disease COVID-19, or coronavirus disease. Virus name The virus that causes the disease is called severe acute respiratory syndrome coronavirus 2 (SARS-CoV-2). More information on disease and virus naming World Health Organization [facility]): www.who.int/emergencies/diseases/novel-coronavirus-2019/technical-guidance/naming-the-coronavirus-disease-(covid-2019)-and-the-virus-that-causes-it Who is at risk for complications from coronavirus disease? Some people may be at higher risk for complications from coronavirus disease. This includes older adults and people who have chronic diseases, such as heart disease, diabetes, and lung disease. If you are at higher risk for complications, take these extra precautions:  Stay home as much as possible.  Avoid social gatherings and travel.  Avoid close contact with others. Stay at least 6 ft (2 m) away from others, if possible.  Wash your hands often with soap and water for at least 20 seconds.  Avoid touching your face, mouth, nose, or eyes.  Keep supplies on hand at home, such as food, medicine, and cleaning supplies.  If you must go out in public, wear a cloth  face covering or face mask. Make sure your mask covers your nose and mouth. How does coronavirus disease spread? The virus that causes coronavirus disease spreads easily from person to person (is contagious). You may catch the virus by:  Breathing in droplets from an infected person. Droplets can be spread by a person breathing, speaking, singing, coughing, or sneezing.  Touching something, like a table or a doorknob, that was exposed to the virus (contaminated) and then touching your mouth, nose, or eyes. Can I get the virus from touching surfaces or objects? There is still a lot that we do not know about the virus that causes coronavirus disease. Scientists are basing a lot of information on what they know about similar viruses, such as:  Viruses cannot generally survive on surfaces for long. They need a human body (host) to survive.  It is more likely that the virus is spread by close contact with people who are sick (direct contact), such as through: ? Shaking hands or hugging. ? Breathing in respiratory droplets that travel through the air. Droplets can be spread by a person breathing, speaking, singing, coughing, or sneezing.  It is less likely that the virus is spread when a person touches a surface or object that has the virus on it (indirect contact). The virus may be able to enter the body if the person touches a surface or object and then touches his or her face, eyes, nose, or mouth. Can a person spread the virus without having symptoms of the disease? It may be possible for the virus to spread before a person has symptoms of the disease, but this is most likely not the main way the virus is spreading. It is more likely for the virus to spread by being in close contact with people who are sick and breathing in the respiratory droplets spread by a person breathing, speaking, singing, coughing, or sneezing. What are the symptoms of coronavirus disease? Symptoms vary from person to  person and can range from mild to severe. Symptoms may include:  Fever or chills.  Cough.  Difficulty breathing or feeling short of breath.  Headaches, body aches, or muscle aches.  Runny or stuffy (congested) nose.  Sore throat.  New loss of taste or smell.  Nausea, vomiting, or diarrhea. These symptoms can appear anywhere from 2 to 14 days after you have been exposed to the virus. Some people may not have any symptoms. If you develop symptoms, call your health care provider. People with severe symptoms may need hospital care. Should I be tested for this virus? Your health care provider will decide whether to test you based on your symptoms, history of exposure, and your risk factors. How does a health care provider test for this virus? Health care providers will collect samples to send for testing. Samples may include:  Taking a swab of fluid from the back of your nose and throat, your nose, or your throat.  Taking fluid from the lungs by having you cough up mucus (sputum) into a sterile cup.  Taking a blood sample. Is there a treatment or vaccine for this virus? Currently, there is no vaccine to prevent coronavirus disease. Also, there are no medicines like antibiotics or antivirals to treat the virus. A person who becomes sick is given supportive care, which means rest and fluids. A person may also  relieve his or her symptoms by using over-the-counter medicines that treat sneezing, coughing, and runny nose. These are the same medicines that a person takes for the common cold. If you develop symptoms, call your health care provider. People with severe symptoms may need hospital care. What can I do to protect myself and my family from this virus?     You can protect yourself and your family by taking the same actions that you would take to prevent the spread of other viruses. Take the following actions:  Wash your hands often with soap and water for at least 20 seconds. If soap  and water are not available, use alcohol-based hand sanitizer.  Avoid touching your face, mouth, nose, or eyes.  Cough or sneeze into a tissue, sleeve, or elbow. Do not cough or sneeze into your hand or the air. ? If you cough or sneeze into a tissue, throw it away immediately and wash your hands.  Disinfect objects and surfaces that you frequently touch every day.  Stay away from people who are sick.  Avoid going out in public, follow guidance from your state and local health authorities.  Avoid crowded indoor spaces. Stay at least 6 ft (2 m) away from others.  If you must go out in public, wear a cloth face covering or face mask. Make sure your mask covers your nose and mouth.  Stay home if you are sick, except to get medical care. Call your health care provider before you get medical care. Your health care provider will tell you how long to stay home.  Make sure your vaccines are up to date. Ask your health care provider what vaccines you need. What should I do if I need to travel? Follow travel recommendations from your local health authority, the CDC, and WHO. Travel information and advice  Centers for Disease Control and Prevention (CDC): BodyEditor.hu  World Health Organization [facility]): ThirdIncome.ca Know the risks and take action to protect your health  You are at higher risk of getting coronavirus disease if you are traveling to areas with an outbreak or if you are exposed to travelers from areas with an outbreak.  Wash your hands often and practice good hygiene to lower the risk of catching or spreading the virus. What should I do if I am sick? General instructions to stop the spread of infection  Wash your hands often with soap and water for at least 20 seconds. If soap and water are not available, use alcohol-based hand sanitizer.  Cough or sneeze into a tissue, sleeve, or  elbow. Do not cough or sneeze into your hand or the air.  If you cough or sneeze into a tissue, throw it away immediately and wash your hands.  Stay home unless you must get medical care. Call your health care provider or local health authority before you get medical care.  Avoid public areas. Do not take public transportation, if possible.  If you can, wear a mask if you must go out of the house or if you are in close contact with someone who is not sick. Make sure your mask covers your nose and mouth. Keep your home clean  Disinfect objects and surfaces that are frequently touched every day. This may include: ? Counters and tables. ? Doorknobs and light switches. ? Sinks and faucets. ? Electronics such as phones, remote controls, keyboards, computers, and tablets.  Wash dishes in hot, soapy water or use a dishwasher. Air-dry your dishes.  [place] laundry in  hot water. Prevent infecting other household members  Let healthy household members care for children and pets, if possible. If you have to care for children or pets, wash your hands often and wear a mask.  Sleep in a different bedroom or bed, if possible.  Do not share personal items, such as razors, toothbrushes, deodorant, combs, brushes, towels, and washcloths. Where to find more information Centers for Disease Control and Prevention (CDC)  Information and news updates: https://www.butler-gonzalez.com/ World Health Organization [facility])  Information and news updates: MissExecutive.com.ee  Coronavirus health topic: https://www.castaneda.info/  Questions and answers on COVID-19: OpportunityDebt.at  Global tracker: who.sprinklr.com American Academy of Pediatrics (AAP)  Information for families: www.healthychildren.org/English/health-issues/conditions/chest-lungs/Pages/2019-Novel-Coronavirus.aspx The coronavirus situation is changing rapidly. Check  your local health authority website or the CDC and [facility] websites for updates and news. When should I contact a health care provider?  Contact your health care provider if you have symptoms of an infection, such as fever or cough, and you: ? Have been near anyone who is known to have coronavirus disease. ? Have come into contact with a person who is suspected to have coronavirus disease. ? Have traveled to an area where there is an outbreak of COVID-19. When should I get emergency medical care?  Get help right away by calling your local emergency services (911 in the [place] if you have: ? Trouble breathing. ? Pain or pressure in your chest. ? Confusion. ? Blue-tinged lips and fingernails. ? Difficulty waking from sleep. ? Symptoms that get worse. Let the emergency medical personnel know if you think you have coronavirus disease. Summary  A new respiratory virus is spreading from person to person and causing COVID-19 (coronavirus disease).  The virus that causes COVID-19 appears to spread easily. It spreads from one person to another through droplets from breathing, speaking, singing, coughing, or sneezing.  Older adults and those with chronic diseases are at higher risk of disease. If you are at higher risk for complications, take extra precautions.  There is currently no vaccine to prevent coronavirus disease. There are no medicines, such as antibiotics or antivirals, to treat the virus.  You can protect yourself and your family by washing your hands often, avoiding touching your face, and covering your coughs and sneezes. This information is not intended to replace advice given to you by your health care provider. Make sure you discuss any questions you have with your health care provider. Document Revised: 08/30/2019 Document Reviewed: 02/26/2019 Elsevier Patient Education  2020 [place].   COVID-19 COVID-19 is a respiratory infection that is caused by a virus called severe  acute respiratory syndrome coronavirus 2 (SARS-CoV-2). The disease is also known as coronavirus disease or novel coronavirus. In some people, the virus may not cause any symptoms. In others, it may cause a serious infection. The infection can get worse quickly and can lead to complications, such as:  Pneumonia, or infection of the lungs.  Acute respiratory distress syndrome or ARDS. This is a condition in which fluid build-up in the lungs prevents the lungs from filling with air and passing oxygen into the blood.  Acute respiratory failure. This is a condition in which there is not enough oxygen passing from the lungs to the body or when carbon dioxide is not passing from the lungs out of the body.  Sepsis or septic shock. This is a serious bodily reaction to an infection.  Blood clotting problems.  Secondary infections due to bacteria or fungus.  Organ failure. This is when your  body's organs stop working. The virus that causes COVID-19 is contagious. This means that it can spread from person to person through droplets from coughs and sneezes (respiratory secretions). What are the causes? This illness is caused by a virus. You may catch the virus by:  Breathing in droplets from an infected person. Droplets can be spread by a person breathing, speaking, singing, coughing, or sneezing.  Touching something, like a table or a doorknob, that was exposed to the virus (contaminated) and then touching your mouth, nose, or eyes. What increases the risk? Risk for infection You are more likely to be infected with this virus if you:  Are within 6 feet (2 meters) of a person with COVID-19.  Provide care for or live with a person who is infected with COVID-19.  Spend time in crowded indoor spaces or live in shared housing. Risk for serious illness You are more likely to become seriously ill from the virus if you:  Are 60 years of age or older. The higher your age, the more you are at risk for  serious illness.  Live in a nursing home or long-term care facility.  Have cancer.  Have a long-term (chronic) disease such as: ? Chronic lung disease, including chronic obstructive pulmonary disease or asthma. ? A long-term disease that lowers your body's ability to fight infection (immunocompromised). ? Heart disease, including heart failure, a condition in which the arteries that lead to the heart become narrow or blocked (coronary artery disease), a disease which makes the heart muscle thick, weak, or stiff (cardiomyopathy). ? Diabetes. ? Chronic kidney disease. ? Sickle cell disease, a condition in which red blood cells have an abnormal "sickle" shape. ? Liver disease.  Are obese. What are the signs or symptoms? Symptoms of this condition can range from mild to severe. Symptoms may appear any time from 2 to 14 days after being exposed to the virus. They include:  A fever or chills.  A cough.  Difficulty breathing.  Headaches, body aches, or muscle aches.  Runny or stuffy (congested) nose.  A sore throat.  New loss of taste or smell. Some people may also have stomach problems, such as nausea, vomiting, or diarrhea. Other people may not have any symptoms of COVID-19. How is this diagnosed? This condition may be diagnosed based on:  Your signs and symptoms, especially if: ? You live in an area with a COVID-19 outbreak. ? You recently traveled to or from an area where the virus is common. ? You provide care for or live with a person who was diagnosed with COVID-19. ? You were exposed to a person who was diagnosed with COVID-19.  A physical exam.  Lab tests, which may include: ? Taking a sample of fluid from the back of your nose and throat (nasopharyngeal fluid), your nose, or your throat using a swab. ? A sample of mucus from your lungs (sputum). ? Blood tests.  Imaging tests, which may include, X-rays, CT scan, or ultrasound. How is this treated? At present,  there is no medicine to treat COVID-19. Medicines that treat other diseases are being used on a trial basis to see if they are effective against COVID-19. Your health care provider will talk with you about ways to treat your symptoms. For most people, the infection is mild and can be managed at home with rest, fluids, and over-the-counter medicines. Treatment for a serious infection usually takes places in a hospital intensive care unit (ICU). It may include one or  more of the following treatments. These treatments are given until your symptoms improve.  Receiving fluids and medicines through an IV.  Supplemental oxygen. Extra oxygen is given through a tube in the nose, a face mask, or a hood.  Positioning you to lie on your stomach (prone position). This makes it easier for oxygen to get into the lungs.  Continuous positive airway pressure (CPAP) or bi-level positive airway pressure (BPAP) machine. This treatment uses mild air pressure to keep the airways open. A tube that is connected to a motor delivers oxygen to the body.  Ventilator. This treatment moves air into and out of the lungs by using a tube that is placed in your windpipe.  Tracheostomy. This is a procedure to create a hole in the neck so that a breathing tube can be inserted.  Extracorporeal membrane oxygenation (ECMO). This procedure gives the lungs a chance to recover by taking over the functions of the heart and lungs. It supplies oxygen to the body and removes carbon dioxide. Follow these instructions at home: Lifestyle  If you are sick, stay home except to get medical care. Your health care provider will tell you how long to stay home. Call your health care provider before you go for medical care.  Rest at home as told by your health care provider.  Do not use any products that contain nicotine or tobacco, such as cigarettes, e-cigarettes, and chewing tobacco. If you need help quitting, ask your health care  provider.  Return to your normal activities as told by your health care provider. Ask your health care provider what activities are safe for you. General instructions  Take over-the-counter and prescription medicines only as told by your health care provider.  Drink enough fluid to keep your urine pale yellow.  Keep all follow-up visits as told by your health care provider. This is important. How is this prevented?  There is no vaccine to help prevent COVID-19 infection. However, there are steps you can take to protect yourself and others from this virus. To protect yourself:   Do not travel to areas where COVID-19 is a risk. The areas where COVID-19 is reported change often. To identify high-risk areas and travel restrictions, check the CDC travel website: FatFares.com.br  If you live in, or must travel to, an area where COVID-19 is a risk, take precautions to avoid infection. ? Stay away from people who are sick. ? Wash your hands often with soap and water for 20 seconds. If soap and water are not available, use an alcohol-based hand sanitizer. ? Avoid touching your mouth, face, eyes, or nose. ? Avoid going out in public, follow guidance from your state and local health authorities. ? If you must go out in public, wear a cloth face covering or face mask. Make sure your mask covers your nose and mouth. ? Avoid crowded indoor spaces. Stay at least 6 feet (2 meters) away from others. ? Disinfect objects and surfaces that are frequently touched every day. This may include:  Counters and tables.  Doorknobs and light switches.  Sinks and faucets.  Electronics, such as phones, remote controls, keyboards, computers, and tablets. To protect others: If you have symptoms of COVID-19, take steps to prevent the virus from spreading to others.  If you think you have a COVID-19 infection, contact your health care provider right away. Tell your health care team that you think you  may have a COVID-19 infection.  Stay home. Leave your house only to seek  medical care. Do not use public transport.  Do not travel while you are sick.  Wash your hands often with soap and water for 20 seconds. If soap and water are not available, use alcohol-based hand sanitizer.  Stay away from other members of your household. Let healthy household members care for children and pets, if possible. If you have to care for children or pets, wash your hands often and wear a mask. If possible, stay in your own room, separate from others. Use a different bathroom.  Make sure that all people in your household wash their hands well and often.  Cough or sneeze into a tissue or your sleeve or elbow. Do not cough or sneeze into your hand or into the air.  Wear a cloth face covering or face mask. Make sure your mask covers your nose and mouth. Where to find more information  Centers for Disease Control and Prevention: PurpleGadgets.be  World Health Organization: https://www.castaneda.info/ Contact a health care provider if:  You live in or have traveled to an area where COVID-19 is a risk and you have symptoms of the infection.  You have had contact with someone who has COVID-19 and you have symptoms of the infection. Get help right away if:  You have trouble breathing.  You have pain or pressure in your chest.  You have confusion.  You have bluish lips and fingernails.  You have difficulty waking from sleep.  You have symptoms that get worse. These symptoms may represent a serious problem that is an emergency. Do not wait to see if the symptoms will go away. Get medical help right away. Call your local emergency services (911 in the [place] Do not drive yourself to the hospital. Let the emergency medical personnel know if you think you have COVID-19. Summary  COVID-19 is a respiratory infection that is caused by a virus. It is also known as  coronavirus disease or novel coronavirus. It can cause serious infections, such as pneumonia, acute respiratory distress syndrome, acute respiratory failure, or sepsis.  The virus that causes COVID-19 is contagious. This means that it can spread from person to person through droplets from breathing, speaking, singing, coughing, or sneezing.  You are more likely to develop a serious illness if you are 48 years of age or older, have a weak immune system, live in a nursing home, or have chronic disease.  There is no medicine to treat COVID-19. Your health care provider will talk with you about ways to treat your symptoms.  Take steps to protect yourself and others from infection. Wash your hands often and disinfect objects and surfaces that are frequently touched every day. Stay away from people who are sick and wear a mask if you are sick. This information is not intended to replace advice given to you by your health care provider. Make sure you discuss any questions you have with your health care provider. Document Revised: 08/30/2019 Document Reviewed: 12/06/2018 Elsevier Patient Education  2020 [place].  COVID-19: How to Protect Yourself and Others Know how it spreads  There is currently no vaccine to prevent coronavirus disease 2019 (COVID-19).  The best way to prevent illness is to avoid being exposed to this virus.  The virus is thought to spread mainly from person-to-person. ? Between people who are in close contact with one another (within about 6 feet). ? Through respiratory droplets produced when an infected person coughs, sneezes or talks. ? These droplets can land in the mouths or noses  of people who are nearby or possibly be inhaled into the lungs. ? COVID-19 may be spread by people who are not showing symptoms. Everyone should Clean your hands often  Wash your hands often with soap and water for at least 20 seconds especially after you have been in a public place, or  after blowing your nose, coughing, or sneezing.  If soap and water are not readily available, use a hand sanitizer that contains at least 60% alcohol. Cover all surfaces of your hands and rub them together until they feel dry.  Avoid touching your eyes, nose, and mouth with unwashed hands. Avoid close contact  Limit contact with others as much as possible.  Avoid close contact with people who are sick.  Put distance between yourself and other people. ? Remember that some people without symptoms may be able to spread virus. ? This is especially important for people who are at higher risk of getting very [employer] Cover your mouth and nose with a mask when around others  You could spread COVID-19 to others even if you do not feel sick.  Everyone should wear a mask in public settings and when around people not living in their household, especially when social distancing is difficult to maintain. ? Masks should not be placed on young children under age 64, anyone who has trouble breathing, or is unconscious, incapacitated or otherwise unable to remove the mask without assistance.  The mask is meant to protect other people in case you are infected.  Do NOT use a facemask meant for a Dietitian.  Continue to keep about 6 feet between yourself and others. The mask is not a substitute for social distancing. Cover coughs and sneezes  Always cover your mouth and nose with a tissue when you cough or sneeze or use the inside of your elbow.  Throw used tissues in the trash.  Immediately wash your hands with soap and water for at least 20 seconds. If soap and water are not readily available, clean your hands with a hand sanitizer that contains at least 60% alcohol. Clean and disinfect  Clean AND disinfect frequently touched surfaces daily. This includes tables, doorknobs, light switches, countertops, handles,  desks, phones, keyboards, toilets, faucets, and sinks. [employer]  If surfaces are dirty, clean them: Use detergent or soap and water prior to disinfection.  Then, use a household disinfectant. You can see a list of EPA-registered household disinfectants here. [employer] 07/17/2019 This information is not intended to replace advice given to you by your health care provider. Make sure you discuss any questions you have with your health care provider. Document Revised: 07/25/2019 Document Reviewed: 05/23/2019 Elsevier Patient Education  [place]. Daily Diabetes Record Check your blood glucose (BG) as directed by your health care provider. Use this form to record your BG results as well as any diabetes medicines that you take, including insulin. Bringing a record of your BG results and a list of your current medicines to your health care provider is very helpful in managing your diabetes. These numbers help your health care provider to know whether your diabetes management plan needs to be changed. Patient name: ____________________________________ Week of ____________________ Daily BG results and diabetes medicines Date: _________  Breakfast - BG / Medicines: ________________ / __________________________________________________________  Lunch - BG / Medicines: ___________________ / __________________________________________________________  Dinner - BG / Medicines: __________________ / __________________________________________________________  Bedtime - BG / Medicines: ________________ / ___________________________________________________________ Date: _________  Breakfast - BG / Medicines: ________________ /  __________________________________________________________  Lunch - BG / Medicines: ___________________ / __________________________________________________________  Dinner - BG / Medicines:  __________________ / __________________________________________________________  Bedtime - BG / Medicines: ________________ / ___________________________________________________________ Date: _________  Breakfast - BG / Medicines: ________________ / __________________________________________________________  Lunch - BG / Medicines: ___________________ / __________________________________________________________  Dinner - BG / Medicines: __________________ / __________________________________________________________  Bedtime - BG / Medicines: ________________ / ___________________________________________________________ Date: _________  Breakfast - BG / Medicines: ________________ / __________________________________________________________  Lunch - BG / Medicines: ___________________ / __________________________________________________________  Dinner - BG / Medicines: __________________ / __________________________________________________________  Bedtime - BG / Medicines: ________________ / ___________________________________________________________ Date: _________  Breakfast - BG / Medicines: ________________ / __________________________________________________________  Lunch - BG / Medicines: ___________________ / __________________________________________________________  Dinner - BG / Medicines: __________________ / __________________________________________________________  Bedtime - BG / Medicines: ________________ / ___________________________________________________________ Date: _________  Breakfast - BG / Medicines: ________________ / __________________________________________________________  Lunch - BG / Medicines: ___________________ / __________________________________________________________  Dinner - BG / Medicines: __________________ / __________________________________________________________  Bedtime - BG / Medicines: ________________ /  ___________________________________________________________ Date: _________  Breakfast - BG / Medicines: ________________ / __________________________________________________________  Lunch - BG / Medicines: ___________________ / __________________________________________________________  Dinner - BG / Medicines: __________________ / __________________________________________________________  Bedtime - BG / Medicines: ________________ / ___________________________________________________________ Notes: ______________________________________________________________________________________________________________________ This information is not intended to replace advice given to you by your health care provider. Make sure you discuss any questions you have with your health care provider. Document Revised: 08/14/2018 Document Reviewed: 07/29/2016 Elsevier Patient Education  [place].  Blood Glucose Monitoring, Adult Monitoring your blood sugar (glucose) is an important part of managing your diabetes (diabetes mellitus). Blood glucose monitoring involves checking your blood glucose as often as directed and keeping a record (log) of your results over time. Checking your blood glucose regularly and keeping a blood glucose log can:  Help you and your health care provider adjust your diabetes management plan as needed, including your medicines or insulin.  Help you understand how food, exercise, illnesses, and medicines affect your blood glucose.  Let you know what your blood glucose is at any time. You can quickly find out if you have low blood glucose (hypoglycemia) or high blood glucose (hyperglycemia). Your health care provider will set individualized treatment goals for you. Your goals will be based on your age, other medical conditions you have, and how you respond to diabetes treatment. Generally, the goal of treatment is to maintain the following blood glucose levels:  Before  meals (preprandial): 80-130 mg/dL (4.4-7.2 mmol/L).  After meals (postprandial): below 180 mg/dL (10 mmol/L).  A1c level: less than 7%. Supplies needed:  Blood glucose meter.  Test strips for your meter. Each meter has its own strips. You must use the strips that came with your meter.  A needle to prick your finger (lancet). Do not use a lancet more than one time.  A device that holds the lancet (lancing device).  A journal or log book to write down your results. How to check your blood glucose  1. Wash your hands with soap and water. 2. Prick the side of your finger (not the tip) with the lancet. Use a different finger each time. 3. Gently rub the finger until a small drop of blood appears. 4. Follow instructions that come with your meter for inserting the test strip, applying blood to the strip, and using your blood glucose meter. 5. Write down your result and any notes. Some meters allow you to use areas of  your body other than your finger (alternative sites) to test your blood. The most common alternative sites are:  Forearm.  Thigh.  Palm of the hand. If you think you may have hypoglycemia, or if you have a history of not knowing when your blood glucose is getting low (hypoglycemia unawareness), do not use alternative sites. Use your finger instead. Alternative sites may not be as accurate as the fingers, because blood flow is slower in these areas. This means that the result you get may be delayed, and it may be different from the result that you would get from your finger. Follow these instructions at home: Blood glucose log   Every time you check your blood glucose, write down your result. Also write down any notes about things that may be affecting your blood glucose, such as your diet and exercise for the day. This information can help you and your health care provider: ? Look for patterns in your blood glucose over time. ? Adjust your diabetes management plan as  needed.  Check if your meter allows you to download your records to a computer. Most glucose meters store a record of glucose readings in the meter. If you have type 1 diabetes:  Check your blood glucose 2 or more times a day.  Also check your blood glucose: ? Before every insulin injection. ? Before and after exercise. ? Before meals. ? 2 hours after a meal. ? Occasionally between 2:00 a.m. and 3:00 a.m., as directed. ? Before potentially dangerous tasks, like driving or using heavy machinery. ? At bedtime.  You may need to check your blood glucose more often, up to 6-10 times a day, if you: ? Use an insulin pump. ? Need multiple daily injections (MDI). ? Have diabetes that is not well-controlled. ? Are ill. ? Have a history of severe hypoglycemia. ? Have hypoglycemia unawareness. If you have type 2 diabetes:  If you take insulin or other diabetes medicines, check your blood glucose 2 or more times a day.  If you are on intensive insulin therapy, check your blood glucose 4 or more times a day. Occasionally, you may also need to check between 2:00 a.m. and 3:00 a.m., as directed.  Also check your blood glucose: ? Before and after exercise. ? Before potentially dangerous tasks, like driving or using heavy machinery.  You may need to check your blood glucose more often if: ? Your medicine is being adjusted. ? Your diabetes is not well-controlled. ? You are ill. General tips  Always keep your supplies with you.  If you have questions or need help, all blood glucose meters have a 24-hour "hotline" phone number that you can call. You may also contact your health care provider.  After you use a few boxes of test strips, adjust (calibrate) your blood glucose meter by following instructions that came with your meter. Contact a health care provider if:  Your blood glucose is at or above 240 mg/dL (13.3 mmol/L) for 2 days in a row.  You have been sick or have had a fever for 2  days or longer, and you are not getting better.  You have any of the following problems for more than 6 hours: ? You cannot eat or drink. ? You have nausea or vomiting. ? You have diarrhea. Get help right away if:  Your blood glucose is lower than 54 mg/dL (3 mmol/L).  You become confused or you have trouble thinking clearly.  You have difficulty breathing.  You have  moderate or large ketone levels in your urine. Summary  Monitoring your blood sugar (glucose) is an important part of managing your diabetes (diabetes mellitus).  Blood glucose monitoring involves checking your blood glucose as often as directed and keeping a record (log) of your results over time.  Your health care provider will set individualized treatment goals for you. Your goals will be based on your age, other medical conditions you have, and how you respond to diabetes treatment.  Every time you check your blood glucose, write down your result. Also write down any notes about things that may be affecting your blood glucose, such as your diet and exercise for the day. This information is not intended to replace advice given to you by your health care provider. Make sure you discuss any questions you have with your health care provider. Document Revised: 08/24/2018 Document Reviewed: 04/11/2016 Elsevier Patient Education  [place].  Diabetes Mellitus and Nutrition, Adult When you have diabetes (diabetes mellitus), it is very important to have healthy eating habits because your blood sugar (glucose) levels are greatly affected by what you eat and drink. Eating healthy foods in the appropriate amounts, at about the same times every day, can help you:  Control your blood glucose.  Lower your risk of heart disease.  Improve your blood pressure.  Reach or maintain a healthy weight. Every person with diabetes is different, and each person has different needs for a meal plan. Your health care provider may  recommend that you work with a diet and nutrition specialist (dietitian) to make a meal plan that is best for you. Your meal plan may vary depending on factors such as:  The calories you need.  The medicines you take.  Your weight.  Your blood glucose, blood pressure, and cholesterol levels.  Your activity level.  Other health conditions you have, such as heart or kidney disease. How do carbohydrates affect me? Carbohydrates, also called carbs, affect your blood glucose level more than any other type of food. Eating carbs naturally raises the amount of glucose in your blood. Carb counting is a method for keeping track of how many carbs you eat. Counting carbs is important to keep your blood glucose at a healthy level, especially if you use insulin or take certain oral diabetes medicines. It is important to know how many carbs you can safely have in each meal. This is different for every person. Your dietitian can help you calculate how many carbs you should have at each meal and for each snack. Foods that contain carbs include:  Bread, cereal, rice, pasta, and crackers.  Potatoes and corn.  Peas, beans, and lentils.  Milk and yogurt.  Fruit and juice.  Desserts, such as cakes, cookies, ice cream, and candy. How does alcohol affect me? Alcohol can cause a sudden decrease in blood glucose (hypoglycemia), especially if you use insulin or take certain oral diabetes medicines. Hypoglycemia can be a life-threatening condition. Symptoms of hypoglycemia (sleepiness, dizziness, and confusion) are similar to symptoms of having too much alcohol. If your health care provider says that alcohol is safe for you, follow these guidelines:  Limit alcohol intake to no more than 1 drink per day for nonpregnant women and 2 drinks per day for men. One drink equals 12 oz of beer, 5 oz of wine, or 1 oz of hard liquor.  Do not drink on an empty stomach.  Keep yourself hydrated with water, diet soda, or  unsweetened iced tea.  Keep in  mind that regular soda, juice, and other mixers may contain a lot of sugar and must be counted as carbs. What are tips for following this plan?  Reading food labels  Start by checking the serving size on the "Nutrition Facts" label of packaged foods and drinks. The amount of calories, carbs, fats, and other nutrients listed on the label is based on one serving of the item. Many items contain more than one serving per package.  Check the total grams (g) of carbs in one serving. You can calculate the number of servings of carbs in one serving by dividing the total carbs by 15. For example, if a food has 30 g of total carbs, it would be equal to 2 servings of carbs.  Check the number of grams (g) of saturated and trans fats in one serving. Choose foods that have low or no amount of these fats.  Check the number of milligrams (mg) of salt (sodium) in one serving. Most people should limit total sodium intake to less than 2,300 mg per day.  Always check the nutrition information of foods labeled as "low-fat" or "nonfat". These foods may be higher in added sugar or refined carbs and should be avoided.  Talk to your dietitian to identify your daily goals for nutrients listed on the label. Shopping  Avoid buying canned, premade, or processed foods. These foods tend to be high in fat, sodium, and added sugar.  Shop around the outside edge of the grocery store. This includes fresh fruits and vegetables, bulk grains, fresh meats, and fresh dairy. Cooking  Use low-heat cooking methods, such as baking, instead of high-heat cooking methods like deep frying.  Cook using healthy oils, such as olive, canola, or sunflower oil.  Avoid cooking with butter, cream, or high-fat meats. Meal planning  Eat meals and snacks regularly, preferably at the same times every day. Avoid going long periods of time without eating.  Eat foods high in fiber, such as fresh fruits,  vegetables, beans, and whole grains. Talk to your dietitian about how many servings of carbs you can eat at each meal.  Eat 4-6 ounces (oz) of lean protein each day, such as lean meat, chicken, fish, eggs, or tofu. One oz of lean protein is equal to: ? 1 oz of meat, chicken, or fish. ? 1 egg. ?  cup of tofu.  Eat some foods each day that contain healthy fats, such as avocado, nuts, seeds, and fish. Lifestyle  Check your blood glucose regularly.  Exercise regularly as told by your health care provider. This may include: ? 150 minutes of moderate-intensity or vigorous-intensity exercise each week. This could be brisk walking, biking, or water aerobics. ? Stretching and doing strength exercises, such as yoga or weightlifting, at least 2 times a week.  Take medicines as told by your health care provider.  Do not use any products that contain nicotine or tobacco, such as cigarettes and e-cigarettes. If you need help quitting, ask your health care provider.  Work with a Veterinary surgeon or diabetes educator to identify strategies to manage stress and any emotional and social challenges. Questions to ask a health care provider  Do I need to meet with a diabetes educator?  Do I need to meet with a dietitian?  What number can I call if I have questions?  When are the best times to check my blood glucose? Where to find more information:  American Diabetes Association: diabetes.org  Academy of Nutrition and Dietetics: www.eatright.org  National  Institute of Diabetes and Digestive and Kidney Diseases (NIH): [employer] Summary  A healthy meal plan will help you control your blood glucose and maintain a healthy lifestyle.  Working with a diet and nutrition specialist (dietitian) can help you make a meal plan that is best for you.  Keep in mind that carbohydrates (carbs) and alcohol have immediate effects on your blood glucose levels. It is important to count carbs and to use alcohol  carefully. This information is not intended to replace advice given to you by your health care provider. Make sure you discuss any questions you have with your health care provider. Document Revised: 10/13/2017 Document Reviewed: 12/05/2016 Elsevier Patient Education  [place].  Correction Insulin  Correction insulin, also called corrective insulin or a supplemental dose, is a small amount of insulin that can be used to lower your blood sugar (glucose) if it is too high. You may be instructed to check your blood glucose at certain times of the day and to use correction insulin as needed to lower your blood glucose to your target range. Correction insulin is primarily used as part of diabetes management. It may also be prescribed for people who do not have diabetes. What is a correction scale? A correction scale, also called a sliding scale, is prescribed by your health care provider to help you determine when you need correction insulin. Your correction scale is based on your individual treatment goals, and it has two parts:  Ranges of blood glucose levels.  How much correction insulin to give yourself if your blood sugar falls within a certain range. If your blood glucose is in your desired range, you will not need correction insulin and you should take your normal insulin dose. What type of insulin do I need? Your health care provider may prescribe rapid-acting or short-acting insulin for you to use as correction insulin. Rapid-acting insulin:  Starts working quickly, in as little as 5 minutes.  Can last for 3-6 hours.  Works well when taken right before a meal to quickly lower blood glucose. Short-acting insulin:  Starts working in about 30 minutes.  Can last for 6-8 hours.  Should be taken about 30 minutes before you start eating a meal. Talk with your health care provider or pharmacist about which type of correction insulin to take and when to take it. If you use  insulin to control your diabetes, you should use correction insulin in addition to the longer-acting (basal) insulin that you normally use. How do I manage my blood glucose with correction insulin? Giving a correction dose  Check your blood glucose as directed by your health care provider.  Use your correction scale to find the range that your blood glucose is in.  Identify the units of insulin that match your blood glucose range.  Make sure you have food available that you can eat in the next 15-30 minutes, after your correction dose.  Give yourself the dose of correction insulin that your health care provider has prescribed in your correction scale. Always make sure you are using the right type of insulin. ? If your correction insulin is rapid-acting, start eating a meal within 15 minutes after your correction dose to keep your blood glucose from getting too low. ? If your correction insulin is short-acting, start eating a meal within 30 minutes after your correction dose to keep your blood glucose from getting too low. Keeping a blood glucose log   Write down your blood glucose test results and  the amount of insulin that you give yourself. Do this every time you check blood glucose or take insulin. Bring this log with you to your medical visits. This information will help your health care provider to manage your medicines.  Note anything that may affect your blood glucose, such as: ? Changes in normal exercise or activity. ? Changes in your normal schedule, such as changes in your sleep routine, going on vacation, changing your diet, or holidays. ? New over-the-counter or prescription medicines. ? Illness, stress, or anxiety. ? Changes in the time that you took your medicine or insulin. ? Changes in your meals, such as skipping a meal, having a late meal, or dining out. ? Eating things that may affect blood glucose, such as snacks, meal portions that are larger than normal, drinks that  contain sugar, or eating less than usual. What do I need to know about hyperglycemia and hypoglycemia? What is hyperglycemia? Hyperglycemia, also called high blood glucose, occurs when blood glucose is too high. Make sure you know the early signs of hyperglycemia, such as:  Increased thirst.  Hunger.  Feeling very tired.  Needing to urinate more often than usual.  Blurry vision. What is hypoglycemia? Hypoglycemia is also called low blood glucose. Be aware of "stacking" your insulin doses. This happens when you correct a high blood glucose by giving yourself extra insulin too soon after a previous correction dose or mealtime dose. This may cause you to have too much insulin in your body and may put you at risk for hypoglycemia. Hypoglycemia occurs with a blood glucose level at or below 70 mg/dL (3.9 mmol/L). It is important to know the symptoms of hypoglycemia and treat it right away. Always have a 15-gram rapid-acting carbohydrate snack with you to treat low blood glucose. Family members and close friends should also know the symptoms and should understand how to treat hypoglycemia, in case you are not able to treat yourself. What are the symptoms of hypoglycemia? Hypoglycemia symptoms can include:  Hunger.  Anxiety.  Sweating and feeling clammy.  Confusion.  Dizziness or light-headedness.  Sleepiness.  Nausea.  Increased heart rate.  Headache.  Blurry vision.  Jerky movements that you cannot control (seizure).  Nightmares.  Tingling or numbness around the mouth, lips, or tongue.  A change in speech.  Decreased ability to concentrate.  A change in coordination.  Restless sleep.  Tremors or shakes.  Fainting.  Irritability. How do I treat hypoglycemia? If you are alert and able to swallow safely, follow the 15:15 rule:  Take 15 grams of a rapid-acting carbohydrate. Rapid-acting options include: ? 1 tube of glucose gel. ? 3 glucose pills. ? 6-8 pieces of  hard candy. ? 4 oz (120 mL) of fruit juice. ? 4 oz (120 mL) of regular (not diet) soda.  Check your blood glucose 15 minutes after you take the carbohydrate. ? If the repeat blood glucose level is still at or below 70 mg/dL (3.9 mmol/L), take 15 grams of a carbohydrate again. ? If your blood glucose level does not increase above 70 mg/dL (3.9 mmol/L) after 3 tries, seek emergency medical care.  After your blood glucose level returns to normal, eat a meal or a snack within 1 hour.  How do I treat severe hypoglycemia? Severe hypoglycemia is when your blood glucose level is at or below 54 mg/dL (3 mmol/L). Severe hypoglycemia is an emergency. Do not wait to see if the symptoms will go away. Get medical help right away. Call  your local emergency services (911 in the [place] Do not drive yourself to the hospital. If you have severe hypoglycemia and you cannot eat or drink, you may need an injection of glucagon. A family member or close friend should learn how to check your blood glucose and how to give you a glucagon injection. Ask your health care provider if you need to have an emergency glucagon injection kit available. Severe hypoglycemia may need to be treated in a hospital. The treatment may include getting glucose through an IV tube. You may also need treatment for the cause of your hypoglycemia. Why do I need correction insulin if I do not have diabetes? If you do not have diabetes, your health care provider may prescribe insulin because:  Keeping your blood glucose in the target range is important for your overall health.  You are taking medicines that cause your blood glucose to be higher than normal. Contact a health care provider if:  You develop low blood glucose that you are not able to treat yourself.  You have high blood glucose that you are not able to correct with correction insulin.  Your blood glucose is often too low.  You used emergency glucagon to treat low blood  glucose. Get help right away if:  You become unresponsive. If this happens, someone else should call emergency services (911 in the [place] right away.  Your blood glucose is lower than 54 mg/dL (3.0 mmol/L).  You become confused or you have trouble thinking clearly.  You have difficulty breathing. Summary  Correction insulin is a small amount of insulin that can be used to lower your blood sugar (glucose) if it is too high.  Talk with your health care provider or pharmacist about which type of correction insulin to take and when to take it. If you use insulin to control your diabetes, you should use correction insulin in addition to the longer-acting (basal) insulin that you normally use.  You may be instructed to check your blood glucose at certain times of the day and to use correction insulin as needed to lower your blood glucose to your target range. Always keep a log of your blood glucose values and the amount of insulin that you used.  It is important to know the symptoms of hypoglycemia and treat it right away. Always have a 15-gram rapid-acting carbohydrate snack with you to treat low blood glucose. Family members and close friends should also know the symptoms and should understand how to treat hypoglycemia, in case you are not able to treat yourself. This information is not intended to replace advice given to you by your health care provider. Make sure you discuss any questions you have with your health care provider. Document Revised: 11/10/2016 Document Reviewed: 07/29/2016 Elsevier Patient Education  2020 [employer]  Diabetes Basics  Diabetes (diabetes mellitus) is a long-term (chronic) disease. It occurs when the body does not properly use sugar (glucose) that is released from food after you eat. Diabetes may be caused by one or both of these problems:  Your pancreas does not make enough of a hormone called insulin.  Your body does not react in a normal way to insulin  that it makes. Insulin lets sugars (glucose) go into cells in your body. This gives you energy. If you have diabetes, sugars cannot get into cells. This causes high blood sugar (hyperglycemia). Follow these instructions at home: How is diabetes treated? You may need to take insulin or other diabetes medicines daily to  keep your blood sugar in balance. Take your diabetes medicines every day as told by your doctor. List your diabetes medicines here: Diabetes medicines  Name of medicine: ______________________________ ? Amount (dose): _______________ Time (a.m./p.m.): _______________ Notes: ___________________________________  Name of medicine: ______________________________ ? Amount (dose): _______________ Time (a.m./p.m.): _______________ Notes: ___________________________________  Name of medicine: ______________________________ ? Amount (dose): _______________ Time (a.m./p.m.): _______________ Notes: ___________________________________ If you use insulin, you will learn how to give yourself insulin by injection. You may need to adjust the amount based on the food that you eat. List the types of insulin you use here: Insulin  Insulin type: ______________________________ ? Amount (dose): _______________ Time (a.m./p.m.): _______________ Notes: ___________________________________  Insulin type: ______________________________ ? Amount (dose): _______________ Time (a.m./p.m.): _______________ Notes: ___________________________________  Insulin type: ______________________________ ? Amount (dose): _______________ Time (a.m./p.m.): _______________ Notes: ___________________________________  Insulin type: ______________________________ ? Amount (dose): _______________ Time (a.m./p.m.): _______________ Notes: ___________________________________  Insulin type: ______________________________ ? Amount (dose): _______________ Time (a.m./p.m.): _______________ Notes:  ___________________________________ How do I manage my blood sugar?  Check your blood sugar levels using a blood glucose monitor as directed by your doctor. Your doctor will set treatment goals for you. Generally, you should have these blood sugar levels:  Before meals (preprandial): 80-130 mg/dL (4.4-7.2 mmol/L).  After meals (postprandial): below 180 mg/dL (10 mmol/L).  A1c level: less than 7%. Write down the times that you will check your blood sugar levels: Blood sugar checks  Time: _______________ Notes: ___________________________________  Time: _______________ Notes: ___________________________________  Time: _______________ Notes: ___________________________________  Time: _______________ Notes: ___________________________________  Time: _______________ Notes: ___________________________________  Time: _______________ Notes: ___________________________________  What do I need to know about low blood sugar? Low blood sugar is called hypoglycemia. This is when blood sugar is at or below 70 mg/dL (3.9 mmol/L). Symptoms may include:  Feeling: ? Hungry. ? Worried or nervous (anxious). ? Sweaty and clammy. ? Confused. ? Dizzy. ? Sleepy. ? Sick to your stomach (nauseous).  Having: ? A fast heartbeat. ? A headache. ? A change in your vision. ? Tingling or no feeling (numbness) around the mouth, lips, or tongue. ? Jerky movements that you cannot control (seizure).  Having trouble with: ? Moving (coordination). ? Sleeping. ? Passing out (fainting). ? Getting upset easily (irritability). Treating low blood sugar To treat low blood sugar, eat or drink something sugary right away. If you can think clearly and swallow safely, follow the 15:15 rule:  Take 15 grams of a fast-acting carb (carbohydrate). Talk with your doctor about how much you should take.  Some fast-acting carbs are: ? Sugar tablets (glucose pills). Take 3-4 glucose pills. ? 6-8 pieces of hard  candy. ? 4-6 oz (120-150 mL) of fruit juice. ? 4-6 oz (120-150 mL) of regular (not diet) soda. ? 1 Tbsp (15 mL) honey or sugar.  Check your blood sugar 15 minutes after you take the carb.  If your blood sugar is still at or below 70 mg/dL (3.9 mmol/L), take 15 grams of a carb again.  If your blood sugar does not go above 70 mg/dL (3.9 mmol/L) after 3 tries, get help right away.  After your blood sugar goes back to normal, eat a meal or a snack within 1 hour. Treating very low blood sugar If your blood sugar is at or below 54 mg/dL (3 mmol/L), you have very low blood sugar (severe hypoglycemia). This is an emergency. Do not wait to see if the symptoms will go away. Get medical help right away. Call your local  emergency services (911 in the [place] Do not drive yourself to the hospital. Questions to ask your health care provider  Do I need to meet with a diabetes educator?  What equipment will I need to care for myself at home?  What diabetes medicines do I need? When should I take them?  How often do I need to check my blood sugar?  What number can I call if I have questions?  When is my next doctor's visit?  Where can I find a support group for people with diabetes? Where to find more information  American Diabetes Association: www.diabetes.org  American Association of Diabetes Educators: www.diabeteseducator.org/patient-resources Contact a doctor if:  Your blood sugar is at or above 240 mg/dL (13.3 mmol/L) for 2 days in a row.  You have been sick or have had a fever for 2 days or more, and you are not getting better.  You have any of these problems for more than 6 hours: ? You cannot eat or drink. ? You feel sick to your stomach (nauseous). ? You throw up (vomit). ? You have watery poop (diarrhea). Get help right away if:  Your blood sugar is lower than 54 mg/dL (3 mmol/L).  You get confused.  You have trouble: ? Thinking clearly. ? Breathing. Summary  Diabetes  (diabetes mellitus) is a long-term (chronic) disease. It occurs when the body does not properly use sugar (glucose) that is released from food after digestion.  Take insulin and diabetes medicines as told.  Check your blood sugar every day, as often as told.  Keep all follow-up visits as told by your doctor. This is important. This information is not intended to replace advice given to you by your health care provider. Make sure you discuss any questions you have with your health care provider. Document Revised: 07/24/2019 Document Reviewed: 02/02/2018 Elsevier Patient Education  2020 [place] Under Monitoring Name: Joshua Bean  Location: [address] [place] [address]   Infection Prevention Recommendations for Individuals Confirmed to have, or Being Evaluated for, 2019 Novel Coronavirus (COVID-19) Infection Who Receive Care at Home  Individuals who are confirmed to have, or are being evaluated for, COVID-19 should follow the prevention steps below until a healthcare provider or local or state health department says they can return to normal activities.  Stay home except to get medical care You should restrict activities outside your home, except for getting medical care. Do not go to work, school, or public areas, and do not use public transportation or taxis.  Call ahead before visiting your doctor Before your medical appointment, call the healthcare provider and tell them that you have, or are being evaluated for, COVID-19 infection. This will help the healthcare provider's office take steps to keep other people from getting infected. Ask your healthcare provider to call the local or state health department.  Monitor your symptoms Seek prompt medical attention if your illness is worsening (e.g., difficulty breathing). Before going to your medical appointment, call the healthcare provider and tell them that you have, or are being evaluated for, COVID-19  infection. Ask your healthcare provider to call the local or state health department.  Wear a facemask You should wear a facemask that covers your nose and mouth when you are in the same room with other people and when you visit a healthcare provider. People who live with or visit you should also wear a facemask while they are in the same room with you.  Separate yourself from other people in your home As much as possible, you should stay in a different room from other people in your home. Also, you should use a separate bathroom, if available.  Avoid sharing household items You should not share dishes, drinking glasses, cups, eating utensils, towels, bedding, or other items with other people in your home. After using these items, you should wash them thoroughly with soap and water.  Cover your coughs and sneezes Cover your mouth and nose with a tissue when you cough or sneeze, or you can cough or sneeze into your sleeve. Throw used tissues in a lined trash can, and immediately wash your hands with soap and water for at least 20 seconds or use an alcohol-based hand rub.  Wash your [facility] your hands often and thoroughly with soap and water for at least 20 seconds. You can use an alcohol-based hand sanitizer if soap and water are not available and if your hands are not visibly dirty. Avoid touching your eyes, nose, and mouth with unwashed hands.   Prevention Steps for Caregivers and Household Members of Individuals Confirmed to have, or Being Evaluated for, COVID-19 Infection Being Cared for in the Home  If you live with, or provide care at home for, a person confirmed to have, or being evaluated for, COVID-19 infection please follow these guidelines to prevent infection:  Follow healthcare provider's instructions Make sure that you understand and can help the patient follow any healthcare provider instructions for all care.  Provide for the patient's basic needs You should  help the patient with basic needs in the home and provide support for getting groceries, prescriptions, and other personal needs.  Monitor the patient's symptoms If they are getting sicker, call his or her medical provider and tell them that the patient has, or is being evaluated for, COVID-19 infection. This will help the healthcare provider's office take steps to keep other people from getting infected. Ask the healthcare provider to call the local or state health department.  Limit the number of people who have contact with the patient  If possible, have only one caregiver for the patient.  Other household members should stay in another home or place of residence. If this is not possible, they should stay  in another room, or be separated from the patient as much as possible. Use a separate bathroom, if available.  Restrict visitors who do not have an essential need to be in the home.  Keep older adults, very young children, and other sick people away from the patient Keep older adults, very young children, and those who have compromised immune systems or chronic health conditions away from the patient. This includes people with chronic heart, lung, or kidney conditions, diabetes, and cancer.  Ensure good ventilation Make sure that shared spaces in the home have good air flow, such as from an air conditioner or an opened window, weather permitting.  Wash your hands often  Wash your hands often and thoroughly with soap and water for at least 20 seconds. You can use an alcohol based hand sanitizer if soap and water are not available and if your hands are not visibly dirty.  Avoid touching your eyes, nose, and mouth with unwashed hands.  Use disposable paper towels to dry your hands. If not available, use dedicated cloth towels and replace them when they become wet.  Wear a facemask and gloves  Wear a disposable facemask at all times in the room and gloves when you  touch or have  contact with the patient's blood, body fluids, and/or secretions or excretions, such as sweat, saliva, sputum, nasal mucus, vomit, urine, or feces.  Ensure the mask fits over your nose and mouth tightly, and do not touch it during use.  Throw out disposable facemasks and gloves after using them. Do not reuse.  Wash your hands immediately after removing your facemask and gloves.  If your personal clothing becomes contaminated, carefully remove clothing and launder. Wash your hands after handling contaminated clothing.  Place all used disposable facemasks, gloves, and other waste in a lined container before disposing them with other household waste.  Remove gloves and wash your hands immediately after handling these items.  Do not share dishes, glasses, or other household items with the patient  Avoid sharing household items. You should not share dishes, drinking glasses, cups, eating utensils, towels, bedding, or other items with a patient who is confirmed to have, or being evaluated for, COVID-19 infection.  After the person uses these items, you should wash them thoroughly with soap and water.  Wash laundry thoroughly  Immediately remove and wash clothes or bedding that have blood, body fluids, and/or secretions or excretions, such as sweat, saliva, sputum, nasal mucus, vomit, urine, or feces, on them.  Wear gloves when handling laundry from the patient.  Read and follow directions on labels of laundry or clothing items and detergent. In general, wash and dry with the warmest temperatures recommended on the label.  Clean all areas the individual has used often  Clean all touchable surfaces, such as counters, tabletops, doorknobs, bathroom fixtures, toilets, phones, keyboards, tablets, and bedside tables, every day. Also, clean any surfaces that may have blood, body fluids, and/or secretions or excretions on them.  Wear gloves when cleaning surfaces the patient has come in contact  with.  Use a diluted bleach solution (e.g., dilute bleach with 1 part bleach and 10 parts water) or a household disinfectant with a label that says EPA-registered for coronaviruses. To make a bleach solution at home, add 1 tablespoon of bleach to 1 quart (4 cups) of water. For a larger supply, add  cup of bleach to 1 gallon (16 cups) of water.  Read labels of cleaning products and follow recommendations provided on product labels. Labels contain instructions for safe and effective use of the cleaning product including precautions you should take when applying the product, such as wearing gloves or eye protection and making sure you have good ventilation during use of the product.  Remove gloves and wash hands immediately after cleaning.  Monitor yourself for signs and symptoms of illness Caregivers and household members are considered close contacts, should monitor their health, and will be asked to limit movement outside of the home to the extent possible. Follow the monitoring steps for close contacts listed on the symptom monitoring form.   ? If you have additional questions, contact your local health department or call the epidemiologist on call at [phone] (available 24/7). ? This guidance is subject to change. For the most up-to-date guidance from [facility], please refer to their website: [email or web address]

## 2020-08-30 NOTE — Progress Notes (Addendum)
Nsg Discharge Note  Admit Date:  08/14/2020 Discharge date: 08/30/2020   Joshua Bean to be D/C'd Home per MD order.  AVS completed.    Discharge Medication: Allergies as of 08/30/2020      Reactions   Bee Venom Anaphylaxis      Medication List    TAKE these medications   albuterol 108 (90 Base) MCG/ACT inhaler Commonly known as: VENTOLIN HFA Inhale 2 puffs into the lungs every 6 (six) hours as needed for wheezing or shortness of breath.   ALPRAZolam 0.5 MG tablet Commonly known as: Xanax Take 1 tablet (0.5 mg total) by mouth at bedtime as needed for anxiety.   ascorbic acid 500 MG tablet Commonly known as: VITAMIN C Take 1 tablet (500 mg total) by mouth daily. Start taking on: August 31, 2020   aspirin 81 MG chewable tablet Chew 1 tablet (81 mg total) by mouth daily. Start taking on: August 31, 2020   atorvastatin 40 MG tablet Commonly known as: LIPITOR Take 1 tablet (40 mg total) by mouth daily. Start taking on: August 31, 2020   fluticasone 50 MCG/ACT nasal spray Commonly known as: FLONASE Place 2 sprays into both nostrils daily. Start taking on: August 31, 2020   guaiFENesin-dextromethorphan 100-10 MG/5ML syrup Commonly known as: ROBITUSSIN DM Take 10 mLs by mouth every 4 (four) hours as needed for cough.   insulin glargine 100 UNIT/ML Solostar Pen Commonly known as: LANTUS Inject 30 Units into the skin daily.   insulin lispro 200 UNIT/ML KwikPen Commonly known as: HUMALOG Inject 2-10 Units into the skin 3 (three) times daily before meals. 2 units for glucose 150-200, 4 units for glucose 201-250, 6 units for glucose 251-300, 8 units for glucose 301-350, 10 units for glucose 351-400.   metFORMIN 500 MG tablet Commonly known as: Glucophage Take 1 tablet (500 mg total) by mouth 2 (two) times daily with a meal.   mometasone-formoterol 200-5 MCG/ACT Aero Commonly known as: DULERA Inhale 2 puffs into the lungs 2 (two) times daily.   multivitamin  tablet Take 1 tablet by mouth daily.   Pen Needles 3/16" 31G X 5 MM Misc Use with insulin pen as directed   predniSONE 10 MG tablet Commonly known as: DELTASONE Take 3 tablets (30 mg total) by mouth daily with breakfast for 3 days, THEN 2 tablets (20 mg total) daily with breakfast for 3 days, THEN 1 tablet (10 mg total) daily with breakfast for 3 days. Start taking on: August 31, 2020   sildenafil 25 MG tablet Commonly known as: VIAGRA Take 1 tablet (25 mg total) by mouth daily as needed for erectile dysfunction.   zinc sulfate 220 (50 Zn) MG capsule Take 1 capsule (220 mg total) by mouth daily. Start taking on: August 31, 2020            Durable Medical Equipment  (From admission, onward)         Start     Ordered   08/29/20 1033  For home use only DME oxygen  Once       Question Answer Comment  Length of Need 12 Months   Mode or (Route) Nasal cannula   Liters per Minute 4   Frequency Continuous (stationary and portable oxygen unit needed)   Oxygen conserving device Yes   Oxygen delivery system Gas      08/29/20 1032          Discharge Assessment: Vitals:   08/30/20 0838 08/30/20 0840  BP:  Pulse:    Resp:    Temp:    SpO2: (!) 80% (!) 86%   Skin clean, dry and intact without evidence of skin break down, no evidence of skin tears noted. IV catheter discontinued intact. Site without signs and symptoms of complications - no redness or edema noted at insertion site, patient denies c/o pain - only slight tenderness at site.  Dressing with slight pressure applied.  D/c Instructions-Education: Discharge instructions given to patient/family with verbalized understanding. Patient given diabetic teaching with proper demonstration of obtaining a blood glucose level and insulin administration. Written education provided to patient for home use.  Patient educated about how to use the oxygen tank provided and to obtain a pulse oximetry as soon as possible to monitor  saturations at home.  D/c education completed with patient/family including follow up instructions, medication list, d/c activities limitations if indicated, with other d/c instructions as indicated by MD - patient able to verbalize understanding, all questions fully answered. Patient instructed to return to ED, call 911, or call MD for any changes in condition.  Patient escorted via [place], and D/C home via private auto.  Hiram Comber, RN 08/30/2020 2:28 PM

## 2020-08-30 NOTE — Discharge Summary (Signed)
Physician Discharge Summary  Joshua Bean GNF:[id number] DOB: 04-25-1976 DOA: 08/14/2020  PCP: Lorre Munroe, NP  Admit date: 08/14/2020 Discharge date: 08/30/2020  Admitted From: Home Disposition: Home  Recommendations for Outpatient Follow-up:  1. Follow up with PCP in 1-2 weeks 2. Continue prednisone taper on discharge 3. Continue Combivent MDI, albuterol MDI as needed, zinc, vitamin C, antitussives 4. Started on Lantus 30 units subcutaneously daily, Metformin 500 mg twice daily and Humalog insulin sliding scale 5. Encourage patient to restart his atorvastatin for poorly controlled hyperlipidemia 6. Will need close follow-up with PCP for further guidance and titration of insulin regimen and follow-up above hyperlipidemia  Home Health: PT/OT Equipment/Devices: Oxygen, 4 L per nasal cannula  Discharge Condition: Stable CODE STATUS: Full code Diet recommendation: Heart healthy diet  History of present illness:  Joshua Bean is a 44 year old male with past medical history notable for anxiety, hyperlipidemia, type 2 diabetes mellitus who presented to the ED with progressive shortness of breath following a known Covid-19 exposure.  On arrival he was noted to have a SPO2 84% on room air and was tachycardic.  Patient also with associated fever, headache, nausea and poor oral intake.  In the ED, he was found to have a temperature of 102.9, tachycardic, SARS-CoV-2 PCR positive.  Hospitalist service was consulted for further evaluation and treatment of acute hypoxic respiratory failure secondary to Covid-19 viral pneumonia.  Hospital course:  Acute hypoxic respiratory failure secondary to acute Covid-19 viral pneumonia during the ongoing 2020/2021 Covid 19 Pandemic - POA Patient presenting to the ED with progressive shortness of breath, known recent Covid-19 exposure.  On arrival he was found to be hypoxic with SPO2 84% on room air.  Pain with heterogeneous airspace opacities bilaterally  consistent with viral multifocal pneumonia. COVID PCR positive 08/14/2020.  CRP 20.5 with D-dimer 2.35 on admission.  Patient was started on aggressive therapy with remdesivir and completed 5-day course on 08/19/2020.  Patient also completed a 14-day course of baricitinib and was initially started on high-dose IV steroids which was slowly tapered to oral steroids.  Patient will continue prednisone taper on discharge.  Patient's oxygen requirement slowly improved during hospitalization, but will need home oxygen at 4 L nasal cannula with exertion.  Continue mometasone-formoterol MDI twice daily, and albuterol MDI as needed.  We will continue zinc, vitamin C, antitussives on discharge.  Patient has completed 14-day isolation.  During his hospitalization.  Given his significant debility with exertional dyspnea, recommend patient remain out of work until follows up with PCP for further guidance.  The treatment plan and use of medications and known side effects were discussed with patient/family. Some of the medications used are based on case reports/anecdotal data.  All other medications being used in the management of COVID-19 based on limited study data.  Complete risks and long-term side effects are unknown, however in the best clinical judgment they seem to be of some benefit.  Patient wanted to proceed with treatment options provided.  Type 2 diabetes mellitus: Hemoglobin A1c 10.8, poorly controlled.  Not on medications at home.  Started on Lantus 30 units subcutaneously daily.  We will also start Metformin 500 mg p.o. twice daily.  Humalog insulin sliding scale for further coverage.  Will need close follow-up with PCP for further guidance and titration of regimen.  Discussed with patient extensively during hospitalization regarding needs for lifestyle changes and diet modification.  Hyperlipidemia:  Patient has been noncompliant with his home statin use.  Last lipid panel  06/04/2020 with total cholesterol  521 HDL 15.  Discussed with patient needs to be compliant with his statin therapy as well as dietary modifications.  Resume atorvastatin 40 mg p.o. daily.  Outpatient follow-up with PCP.  Anxiety: Xanax 0.5 mg 3 times daily as needed  Discharge Diagnoses:  Principal Problem:   Pneumonia due to COVID-19 virus Active Problems:   Diabetes mellitus (HCC)   Anxiety and depression   Sepsis (HCC)   Acute respiratory failure with hypoxia [facility])    Discharge Instructions  Discharge Instructions    Call MD for:  difficulty breathing, headache or visual disturbances   Complete by: As directed    Call MD for:  extreme fatigue   Complete by: As directed    Call MD for:  persistant dizziness or light-headedness   Complete by: As directed    Call MD for:  persistant nausea and vomiting   Complete by: As directed    Call MD for:  severe uncontrolled pain   Complete by: As directed    Call MD for:  temperature >100.4   Complete by: As directed    Diet - low sodium heart healthy   Complete by: As directed    Increase activity slowly   Complete by: As directed      Allergies as of 08/30/2020      Reactions   Bee Venom Anaphylaxis      Medication List    TAKE these medications   albuterol 108 (90 Base) MCG/ACT inhaler Commonly known as: VENTOLIN HFA Inhale 2 puffs into the lungs every 6 (six) hours as needed for wheezing or shortness of breath.   ALPRAZolam 0.5 MG tablet Commonly known as: Xanax Take 1 tablet (0.5 mg total) by mouth at bedtime as needed for anxiety.   ascorbic acid 500 MG tablet Commonly known as: VITAMIN C Take 1 tablet (500 mg total) by mouth daily. Start taking on: August 31, 2020   aspirin 81 MG chewable tablet Chew 1 tablet (81 mg total) by mouth daily. Start taking on: August 31, 2020   atorvastatin 40 MG tablet Commonly known as: LIPITOR Take 1 tablet (40 mg total) by mouth daily. Start taking on: August 31, 2020   fluticasone 50 MCG/ACT nasal  spray Commonly known as: FLONASE Place 2 sprays into both nostrils daily. Start taking on: August 31, 2020   guaiFENesin-dextromethorphan 100-10 MG/5ML syrup Commonly known as: ROBITUSSIN DM Take 10 mLs by mouth every 4 (four) hours as needed for cough.   insulin glargine 100 UNIT/ML Solostar Pen Commonly known as: LANTUS Inject 30 Units into the skin daily.   insulin lispro 200 UNIT/ML KwikPen Commonly known as: HUMALOG Inject 2-10 Units into the skin 3 (three) times daily before meals.   metFORMIN 500 MG tablet Commonly known as: Glucophage Take 1 tablet (500 mg total) by mouth 2 (two) times daily with a meal.   mometasone-formoterol 200-5 MCG/ACT Aero Commonly known as: DULERA Inhale 2 puffs into the lungs 2 (two) times daily.   multivitamin tablet Take 1 tablet by mouth daily.   Pen Needles 3/16" 31G X 5 MM Misc Use with insulin pen as directed   predniSONE 10 MG tablet Commonly known as: DELTASONE Take 3 tablets (30 mg total) by mouth daily with breakfast for 3 days, THEN 2 tablets (20 mg total) daily with breakfast for 3 days, THEN 1 tablet (10 mg total) daily with breakfast for 3 days. Start taking on: August 31, 2020   sildenafil 25  MG tablet Commonly known as: VIAGRA Take 1 tablet (25 mg total) by mouth daily as needed for erectile dysfunction.   zinc sulfate 220 (50 Zn) MG capsule Take 1 capsule (220 mg total) by mouth daily. Start taking on: August 31, 2020            Durable Medical Equipment  (From admission, onward)         Start     Ordered   08/29/20 1033  For home use only DME oxygen  Once       Question Answer Comment  Length of Need 12 Months   Mode or (Route) Nasal cannula   Liters per Minute 4   Frequency Continuous (stationary and portable oxygen unit needed)   Oxygen conserving device Yes   Oxygen delivery system Gas      08/29/20 1032          Follow-up Information    [facility] Follow up.   Why: For  home health services. They will call you in 1-2 days to set up home health services for RN and PT. Contact information: [address] [place] [address] [phone]        [facility], [facility] Follow up.   Why: For home oxygen set up. They will provide a tank for transportation home, and meet you at your house to set up your home concentrator Contact information: [address] [place] [address] [phone]        Lorre Munroe, NP. Schedule an appointment as soon as possible for a visit in 1 week(s).   Specialties: Internal Medicine, Emergency Medicine Contact information: [address] [place] [address] [phone]              Allergies  Allergen Reactions  . Bee Venom Anaphylaxis    Consultations:  None   Procedures/Studies: DG Chest Port 1 View  Result Date: 08/26/2020 CLINICAL DATA:  Shortness of breath, cough, COVID pneumonia EXAM: PORTABLE CHEST 1 VIEW COMPARISON:  08/17/2020 FINDINGS: Low volume AP portable examination with slight interval increase in heterogeneous airspace opacity throughout the lungs, most conspicuous in the right midlung. The heart and mediastinum are unremarkable. IMPRESSION: Low volume AP portable examination with slight interval increase in heterogeneous airspace opacity throughout the lungs, most conspicuous in the right midlung, generally in keeping with worsened COVID-19 airspace disease. Electronically Signed   By: Lauralyn Primes M.D.   On: 08/26/2020 08:21   DG Chest Port 1 View  Result Date: 08/17/2020 CLINICAL DATA:  Shortness of breath, hypoxia, COVID-19 positive EXAM: PORTABLE CHEST 1 VIEW COMPARISON:  08/14/2020 FINDINGS: The heart size and mediastinal contours are within normal limits. Patchy airspace opacities throughout both lung fields with a predominantly basilar and peripheral distribution. No appreciable interval progression from prior. No pleural effusion or pneumothorax. The visualized  skeletal structures are unremarkable. IMPRESSION: Patchy bilateral airspace opacities compatible with multifocal atypical/viral pneumonia. No appreciable interval progression from prior. Electronically Signed   By: Duanne Guess D.O.   On: 08/17/2020 08:47   DG Chest Port 1 View  Result Date: 08/14/2020 CLINICAL DATA:  Shortness of breath.  COVID positive. EXAM: PORTABLE CHEST 1 VIEW COMPARISON:  06/05/2016 FINDINGS: Lung volumes are low. Patchy heterogeneous bilateral airspace opacities in a mid-lower lung zone predominant distribution. Normal heart size for technique. No evidence of pneumomediastinum. No significant pleural effusion. No pneumothorax. No acute osseous abnormalities are seen. Overlying oxygen tubing and EKG leads. IMPRESSION: Patchy heterogeneous  bilateral airspace opacities consistent with COVID-19 pneumonia. Electronically Signed   By: Narda Rutherford M.D.   On: 08/14/2020 22:27   VAS [place] LOWER EXTREMITY VENOUS (DVT)  Result Date: 08/16/2020  Lower Venous DVT Study Indications: Covid-19, elevated D-Dimer.  Comparison Study: No prior study on file Performing Technologist: Sherren Kerns RVS  Examination Guidelines: A complete evaluation includes B-mode imaging, spectral Doppler, color Doppler, and power Doppler as needed of all accessible portions of each vessel. Bilateral testing is considered an integral part of a complete examination. Limited examinations for reoccurring indications may be performed as noted. The reflux portion of the exam is performed with the patient in reverse Trendelenburg.  +---------+---------------+---------+-----------+----------+--------------+ RIGHT    CompressibilityPhasicitySpontaneityPropertiesThrombus Aging +---------+---------------+---------+-----------+----------+--------------+ CFV      Full           Yes      Yes                                 +---------+---------------+---------+-----------+----------+--------------+ SFJ      Full                                                         +---------+---------------+---------+-----------+----------+--------------+ FV Prox  Full                                                        +---------+---------------+---------+-----------+----------+--------------+ FV Mid   Full                                                        +---------+---------------+---------+-----------+----------+--------------+ FV DistalFull                                                        +---------+---------------+---------+-----------+----------+--------------+ PFV      Full                                                        +---------+---------------+---------+-----------+----------+--------------+ POP      Full           Yes      Yes                                 +---------+---------------+---------+-----------+----------+--------------+ PTV      Full                                                        +---------+---------------+---------+-----------+----------+--------------+  PERO     Full                                                        +---------+---------------+---------+-----------+----------+--------------+   +---------+---------------+---------+-----------+----------+--------------+ LEFT     CompressibilityPhasicitySpontaneityPropertiesThrombus Aging +---------+---------------+---------+-----------+----------+--------------+ CFV      Full           Yes      Yes                                 +---------+---------------+---------+-----------+----------+--------------+ SFJ      Full                                                        +---------+---------------+---------+-----------+----------+--------------+ FV Prox  Full                                                        +---------+---------------+---------+-----------+----------+--------------+ FV Mid   Full                                                         +---------+---------------+---------+-----------+----------+--------------+ FV DistalFull                                                        +---------+---------------+---------+-----------+----------+--------------+ PFV      Full                                                        +---------+---------------+---------+-----------+----------+--------------+ POP      Full           Yes      Yes                                 +---------+---------------+---------+-----------+----------+--------------+ PTV      Full                                                        +---------+---------------+---------+-----------+----------+--------------+ PERO     Full                                                        +---------+---------------+---------+-----------+----------+--------------+  Summary: BILATERAL: - No evidence of deep vein thrombosis seen in the lower extremities, bilaterally. -   *See table(s) above for measurements and observations. Electronically signed by Coral Else MD on 08/16/2020 at 7:41:42 PM.    Final       Subjective: Patient seen and examined bedside, resting comfortably.  Continues on 2 L nasal cannula at rest, but needs 4 L with exertion.  Patient states ready for discharge home.  Concerned about having to return to work, discussed with him given his significant debility needs to remain out of work until follows up with PCP.  No other questions or concerns at this time.  Denies headache, no visual changes, no chest pain, palpitations, no fever/chills/night sweats, no nausea/vomiting/diarrhea, no abdominal pain, no weakness, no paresthesias.  No acute events overnight per nursing staff.  Discharge Exam: Vitals:   08/30/20 0838 08/30/20 0840  BP:    Pulse:    Resp:    Temp:    SpO2: (!) 80% (!) 86%   Vitals:   08/29/20 1950 08/30/20 0509 08/30/20 0838 08/30/20 0840  BP: 110/74 91/67    Pulse: 97 73    Resp: 19 16     Temp: 98.6 F (37 C) 97.7 F (36.5 C)    TempSrc: Oral Oral    SpO2: 92% 95% (!) 80% (!) 86%  Weight:      Height:        General: Pt is alert, awake, not in acute distress Cardiovascular: RRR, S1/S2 +, no rubs, no gallops Respiratory: CTA bilaterally, no wheezing, no rhonchi, on 2 L nasal cannula Abdominal: Soft, NT, ND, bowel sounds + Extremities: no edema, no cyanosis    The results of significant diagnostics from this hospitalization (including imaging, microbiology, ancillary and laboratory) are listed below for reference.     Microbiology: No results found for this or any previous visit (from the past 240 hour(s)).   Labs: BNP (last 3 results) Recent Labs    08/17/20 0152 08/18/20 0143 08/19/20 0717  BNP 84.0 74.4 79.5   Basic Metabolic Panel: Recent Labs  Lab 08/26/20 0709  NA 137  K 3.4*  CL 102  CO2 26  GLUCOSE 96  BUN 13  CREATININE 0.67  CALCIUM 8.6*   Liver Function Tests: Recent Labs  Lab 08/26/20 0709  AST 15  ALT 24  ALKPHOS 62  BILITOT 0.4  PROT 5.6*  ALBUMIN 2.5*   No results for input(s): LIPASE, AMYLASE in the last 168 hours. No results for input(s): AMMONIA in the last 168 hours. CBC: Recent Labs  Lab 08/26/20 0709  WBC 9.7  HGB 14.0  HCT 40.3  MCV 90.0  PLT 496*   Cardiac Enzymes: Recent Labs  Lab 08/26/20 0709  CKTOTAL 34*   BNP: Invalid input(s): POCBNP CBG: Recent Labs  Lab 08/29/20 1220 08/29/20 1224 08/29/20 1618 08/29/20 2123 08/30/20 0756  GLUCAP 224* 228* 256* 152* 118*   D-Dimer No results for input(s): DDIMER in the last 72 hours. Hgb A1c No results for input(s): HGBA1C in the last 72 hours. Lipid Profile No results for input(s): CHOL, HDL, LDLCALC, TRIG, CHOLHDL, LDLDIRECT in the last 72 hours. Thyroid function studies No results for input(s): TSH, T4TOTAL, T3FREE, THYROIDAB in the last 72 hours.  Invalid input(s): FREET3 Anemia work up No results for input(s): VITAMINB12, FOLATE,  FERRITIN, TIBC, IRON, RETICCTPCT in the last 72 hours. Urinalysis    Component Value Date/Time   COLORURINE YELLOW 08/15/2020 1428   APPEARANCEUR CLEAR 08/15/2020  1428   LABSPEC 1.030 08/15/2020 1428   PHURINE 5.0 08/15/2020 1428   GLUCOSEU >=500 (A) 08/15/2020 1428   HGBUR NEGATIVE 08/15/2020 1428   BILIRUBINUR NEGATIVE 08/15/2020 1428   KETONESUR 80 (A) 08/15/2020 1428   PROTEINUR 30 (A) 08/15/2020 1428   NITRITE NEGATIVE 08/15/2020 1428   LEUKOCYTESUR NEGATIVE 08/15/2020 1428   Sepsis Labs Invalid input(s): PROCALCITONIN,  WBC,  LACTICIDVEN Microbiology No results found for this or any previous visit (from the past 240 hour(s)).   Time coordinating discharge: Over 30 minutes  SIGNED:   Alvira Philips [place], DO  [facility] 08/30/2020, 10:21 AM

## 2020-08-30 NOTE — TOC Transition Note (Signed)
Transition of Care [facility]) - CM/SW Discharge Note   Patient Details  Name: DONNEY CARAVEO MRN: [id number] Date of Birth: August 15, 1976  [facility] CM/SW Contact:  Carles Collet, RN Phone Number: 08/30/2020, 10:47 AM   Clinical Narrative:    Verified home oxygen is getting delivered to the home this morning. [facility] notified of DC.     Final next level of care: Home/Self Care Barriers to Discharge: Continued Medical Work up   Patient Goals and CMS Choice     Choice offered to / list presented to : NA  Discharge Placement                       Discharge Plan and Services In-house Referral: NA Discharge Planning Services: NA Post Acute Care Choice: NA            DME Agency:  [facility] Aver) Date DME Agency Contacted: 08/29/20 Time DME Agency Contacted: [id number] Representative spoke with at DME Agency: [facility]on [facility] HH Arranged: RN, PT [place] Agency: [place] Date [place]: 08/29/20 Time [place]: 1420 Representative spoke with at [place]: [place] ([place]) Interventions     Readmission Risk Interventions No flowsheet data found.

## 2020-08-31 ENCOUNTER — Telehealth: Payer: Self-pay

## 2020-08-31 NOTE — Telephone Encounter (Signed)
Transition Care Management Follow-up Telephone Call  Date of discharge and from where: 08/30/2020, [facility]  How have you been since you were released from the hospital? Patient states that he is doing much better.  Any questions or concerns? No  Items Reviewed:  Did the pt receive and understand the discharge instructions provided? Yes   Medications obtained and verified? Yes   Other? No   Any new allergies since your discharge? No   Dietary orders reviewed? Yes  Do you have support at home? Yes   Home Care and Equipment/Supplies: Were home health services ordered? yes If so, what is the name of the agency?  [facility]  Has the agency set up a time to come to the patient's home? yes Were any new equipment or medical supplies ordered?  Yes: oxygen What is the name of the medical supply agency? [employer] Were you able to get the supplies/equipment? yes Do you have any questions related to the use of the equipment or supplies? No  Functional Questionnaire: (I = Independent and D = Dependent) ADLs: I  Bathing/Dressing- I  Meal Prep- I  Eating- I  Maintaining continence- I  Transferring/Ambulation- I  Managing Meds- I  Follow up appointments reviewed:   PCP Hospital f/u appt confirmed? Yes  Scheduled to see Joshua Silversmith, NP on 09/08/2020 @ 12:15 pm.  [facility] f/u appt confirmed? N/A  Are transportation arrangements needed? No   If their condition worsens, is the pt aware to call PCP or go to the Emergency Dept.? Yes  Was the patient provided with contact information for the PCP's office or ED? Yes  Was to pt encouraged to call back with questions or concerns? Yes

## 2020-08-31 NOTE — Telephone Encounter (Signed)
Transition Care Management Unsuccessful Follow-up Telephone Call  Date of discharge and from where:  [facility]  Attempts:  1st Attempt  Reason for unsuccessful TCM follow-up call:  Voice mail full

## 2020-08-31 NOTE — Telephone Encounter (Signed)
noted 

## 2020-09-01 ENCOUNTER — Other Ambulatory Visit: Payer: Self-pay | Admitting: Internal Medicine

## 2020-09-02 NOTE — Telephone Encounter (Signed)
Last filled 06/04/2020... please advise

## 2020-09-04 ENCOUNTER — Telehealth: Payer: Self-pay

## 2020-09-04 ENCOUNTER — Telehealth: Payer: Self-pay | Admitting: Internal Medicine

## 2020-09-04 NOTE — Telephone Encounter (Signed)
Vm left at Triage from [place], [place] with [facility].  States she is with the pt for PT eval and pt's resting HR is 129-130.  All other vitals are within normal limits.  Hoyle Sauer just wanted to report to [place].  Hoyle Sauer can be reached at [phone].

## 2020-09-04 NOTE — Telephone Encounter (Signed)
Ok for verbal orders as requested 

## 2020-09-04 NOTE — Telephone Encounter (Signed)
April w/[facility] [phone] called and says she wants orders for following the patient as follows:  1 week 1 2 week 2 1 every other week for 4 2 prn

## 2020-09-07 ENCOUNTER — Telehealth: Payer: Self-pay

## 2020-09-07 NOTE — Telephone Encounter (Signed)
Vm left at triage.  Baily, PTA with [facility], states pt was recently d/c from hospital for COVID-19.  Went into the home to start PT, pt had elevated HR.  Says pt has OV tomorrow but Skip Estimable wanted to make [facility] aware.  Mel Almond can be reached at [phone] with any questions.

## 2020-09-07 NOTE — Telephone Encounter (Signed)
VO given as instructed  

## 2020-09-07 NOTE — Telephone Encounter (Signed)
Noted, if worsens prior to appt tomorrow, recommend ER.

## 2020-09-08 ENCOUNTER — Ambulatory Visit: Payer: 59 | Admitting: Internal Medicine

## 2020-09-08 ENCOUNTER — Emergency Department (HOSPITAL_COMMUNITY): Payer: 59

## 2020-09-08 ENCOUNTER — Inpatient Hospital Stay (HOSPITAL_COMMUNITY)
Admission: EM | Admit: 2020-09-08 | Discharge: 2020-09-11 | DRG: 378 | Disposition: A | Discharge status: Home / Self Care | Payer: 59 | Attending: Internal Medicine | Admitting: Internal Medicine

## 2020-09-08 ENCOUNTER — Encounter (HOSPITAL_COMMUNITY): Payer: Self-pay | Admitting: Emergency Medicine

## 2020-09-08 DIAGNOSIS — K2970 Gastritis, unspecified, without bleeding: Secondary | ICD-10-CM | POA: Diagnosis not present

## 2020-09-08 DIAGNOSIS — D62 Acute posthemorrhagic anemia: Secondary | ICD-10-CM | POA: Diagnosis present

## 2020-09-08 DIAGNOSIS — K3189 Other diseases of stomach and duodenum: Secondary | ICD-10-CM | POA: Diagnosis present

## 2020-09-08 DIAGNOSIS — K259 Gastric ulcer, unspecified as acute or chronic, without hemorrhage or perforation: Secondary | ICD-10-CM | POA: Diagnosis not present

## 2020-09-08 DIAGNOSIS — K5900 Constipation, unspecified: Secondary | ICD-10-CM | POA: Diagnosis present

## 2020-09-08 DIAGNOSIS — Z8616 Personal history of COVID-19: Secondary | ICD-10-CM

## 2020-09-08 DIAGNOSIS — K2971 Gastritis, unspecified, with bleeding: Secondary | ICD-10-CM | POA: Diagnosis present

## 2020-09-08 DIAGNOSIS — E785 Hyperlipidemia, unspecified: Secondary | ICD-10-CM | POA: Diagnosis present

## 2020-09-08 DIAGNOSIS — F32A Depression, unspecified: Secondary | ICD-10-CM | POA: Diagnosis present

## 2020-09-08 DIAGNOSIS — Z87892 Personal history of anaphylaxis: Secondary | ICD-10-CM | POA: Diagnosis not present

## 2020-09-08 DIAGNOSIS — Z91030 Bee allergy status: Secondary | ICD-10-CM

## 2020-09-08 DIAGNOSIS — E1140 Type 2 diabetes mellitus with diabetic neuropathy, unspecified: Secondary | ICD-10-CM | POA: Diagnosis present

## 2020-09-08 DIAGNOSIS — K264 Chronic or unspecified duodenal ulcer with hemorrhage: Secondary | ICD-10-CM | POA: Diagnosis present

## 2020-09-08 DIAGNOSIS — I959 Hypotension, unspecified: Secondary | ICD-10-CM | POA: Diagnosis not present

## 2020-09-08 DIAGNOSIS — Z7984 Long term (current) use of oral hypoglycemic drugs: Secondary | ICD-10-CM

## 2020-09-08 DIAGNOSIS — K922 Gastrointestinal hemorrhage, unspecified: Secondary | ICD-10-CM

## 2020-09-08 DIAGNOSIS — E860 Dehydration: Secondary | ICD-10-CM | POA: Diagnosis present

## 2020-09-08 DIAGNOSIS — E119 Type 2 diabetes mellitus without complications: Secondary | ICD-10-CM

## 2020-09-08 DIAGNOSIS — Z794 Long term (current) use of insulin: Secondary | ICD-10-CM

## 2020-09-08 DIAGNOSIS — Z7982 Long term (current) use of aspirin: Secondary | ICD-10-CM

## 2020-09-08 DIAGNOSIS — Z85828 Personal history of other malignant neoplasm of skin: Secondary | ICD-10-CM

## 2020-09-08 DIAGNOSIS — E1165 Type 2 diabetes mellitus with hyperglycemia: Secondary | ICD-10-CM | POA: Diagnosis present

## 2020-09-08 DIAGNOSIS — K254 Chronic or unspecified gastric ulcer with hemorrhage: Secondary | ICD-10-CM | POA: Diagnosis present

## 2020-09-08 DIAGNOSIS — Z79899 Other long term (current) drug therapy: Secondary | ICD-10-CM | POA: Diagnosis not present

## 2020-09-08 DIAGNOSIS — Z833 Family history of diabetes mellitus: Secondary | ICD-10-CM

## 2020-09-08 DIAGNOSIS — Z8701 Personal history of pneumonia (recurrent): Secondary | ICD-10-CM

## 2020-09-08 DIAGNOSIS — Z720 Tobacco use: Secondary | ICD-10-CM

## 2020-09-08 DIAGNOSIS — F419 Anxiety disorder, unspecified: Secondary | ICD-10-CM | POA: Diagnosis present

## 2020-09-08 DIAGNOSIS — E876 Hypokalemia: Secondary | ICD-10-CM | POA: Diagnosis present

## 2020-09-08 DIAGNOSIS — K269 Duodenal ulcer, unspecified as acute or chronic, without hemorrhage or perforation: Secondary | ICD-10-CM | POA: Diagnosis not present

## 2020-09-08 DIAGNOSIS — D649 Anemia, unspecified: Secondary | ICD-10-CM

## 2020-09-08 DIAGNOSIS — R079 Chest pain, unspecified: Secondary | ICD-10-CM

## 2020-09-08 DIAGNOSIS — K921 Melena: Secondary | ICD-10-CM

## 2020-09-08 HISTORY — DX: Type 2 diabetes mellitus without complications: E11.9

## 2020-09-08 HISTORY — DX: Pneumonia, unspecified organism: J18.9

## 2020-09-08 LAB — CBC
HCT: 28 % — ABNORMAL LOW (ref 39.0–52.0)
Hemoglobin: 9.5 g/dL — ABNORMAL LOW (ref 13.0–17.0)
MCH: 31.9 pg (ref 26.0–34.0)
MCHC: 33.9 g/dL (ref 30.0–36.0)
MCV: 94 fL (ref 80.0–100.0)
Platelets: 240 10*3/uL (ref 150–400)
RBC: 2.98 MIL/uL — ABNORMAL LOW (ref 4.22–5.81)
RDW: 14.8 % (ref 11.5–15.5)
WBC: 5.9 10*3/uL (ref 4.0–10.5)
nRBC: 0 % (ref 0.0–0.2)

## 2020-09-08 LAB — GLUCOSE, CAPILLARY
Glucose-Capillary: 106 mg/dL — ABNORMAL HIGH (ref 70–99)
Glucose-Capillary: 181 mg/dL — ABNORMAL HIGH (ref 70–99)
Glucose-Capillary: 105 mg/dL — ABNORMAL HIGH (ref 70–99)

## 2020-09-08 LAB — COMPREHENSIVE METABOLIC PANEL
ALT: 26 U/L (ref 0–44)
AST: 15 U/L (ref 15–41)
Albumin: 3.1 g/dL — ABNORMAL LOW (ref 3.5–5.0)
Alkaline Phosphatase: 53 U/L (ref 38–126)
Anion gap: 10 (ref 5–15)
BUN: 16 mg/dL (ref 6–20)
CO2: 24 mmol/L (ref 22–32)
Calcium: 8.5 mg/dL — ABNORMAL LOW (ref 8.9–10.3)
Chloride: 101 mmol/L (ref 98–111)
Creatinine, Ser: 0.68 mg/dL (ref 0.61–1.24)
GFR, Estimated: >60 mL/min (ref >60)
Glucose, Bld: 226 mg/dL — ABNORMAL HIGH (ref 70–99)
Potassium: 3.6 mmol/L (ref 3.5–5.1)
Sodium: 135 mmol/L (ref 135–145)
Total Bilirubin: 0.9 mg/dL (ref 0.3–1.2)
Total Protein: 5.7 g/dL — ABNORMAL LOW (ref 6.5–8.1)

## 2020-09-08 LAB — TROPONIN I (HIGH SENSITIVITY)
Troponin I (High Sensitivity): 5 ng/L (ref <18)
Troponin I (High Sensitivity): 3 ng/L (ref <18)

## 2020-09-08 LAB — POC OCCULT BLOOD, ED: Fecal Occult Bld: POSITIVE — AB

## 2020-09-08 LAB — ABO/RH: ABO/RH(D): O POS

## 2020-09-08 LAB — PREPARE RBC (CROSSMATCH)

## 2020-09-08 MED ORDER — ACETAMINOPHEN 325 MG PO TABS
650.0000 mg | ORAL_TABLET | Freq: Four times a day (QID) | ORAL | Status: DC | PRN
  Administered 2020-09-10 – 2020-09-11 (×2): 650 mg via ORAL
  Filled 2020-09-08 (×3): qty 2

## 2020-09-08 MED ORDER — INSULIN ASPART 100 UNIT/ML ~~LOC~~ SOLN
0.0000 [IU] | SUBCUTANEOUS | Status: DC
  Administered 2020-09-08: 4 [IU] via SUBCUTANEOUS
  Administered 2020-09-09: 3 [IU] via SUBCUTANEOUS
  Administered 2020-09-09: 7 [IU] via SUBCUTANEOUS
  Administered 2020-09-09: 4 [IU] via SUBCUTANEOUS
  Administered 2020-09-10 (×2): 3 [IU] via SUBCUTANEOUS
  Administered 2020-09-10: 4 [IU] via SUBCUTANEOUS

## 2020-09-08 MED ORDER — SODIUM CHLORIDE 0.9 % IV SOLN: 10.0000 mL/h | Freq: Once | INTRAVENOUS | Status: DC

## 2020-09-08 MED ORDER — ALPRAZOLAM 0.5 MG PO TABS: 0.5000 mg | ORAL_TABLET | Freq: Every evening | ORAL | Status: DC | PRN

## 2020-09-08 MED ORDER — SODIUM CHLORIDE 0.9 % IV SOLN
80.0000 mg | Freq: Once | INTRAVENOUS | Status: AC
  Administered 2020-09-08: 80 mg via INTRAVENOUS
  Filled 2020-09-08: qty 80

## 2020-09-08 MED ORDER — SODIUM CHLORIDE 0.9 % IV BOLUS
1000.0000 mL | Freq: Once | INTRAVENOUS | Status: AC
  Administered 2020-09-08: 1000 mL via INTRAVENOUS

## 2020-09-08 MED ORDER — SODIUM CHLORIDE 0.9% FLUSH
3.0000 mL | Freq: Two times a day (BID) | INTRAVENOUS | Status: DC
  Administered 2020-09-08 – 2020-09-10 (×2): 3 mL via INTRAVENOUS

## 2020-09-08 MED ORDER — FLUTICASONE PROPIONATE 50 MCG/ACT NA SUSP
2.0000 | Freq: Every day | NASAL | Status: DC
  Administered 2020-09-09: 2 via NASAL
  Filled 2020-09-08: qty 16

## 2020-09-08 MED ORDER — ACETAMINOPHEN 650 MG RE SUPP: 650.0000 mg | Freq: Four times a day (QID) | RECTAL | Status: DC | PRN

## 2020-09-08 MED ORDER — SODIUM CHLORIDE 0.9 % IV SOLN
8.0000 mg/h | INTRAVENOUS | Status: DC
  Administered 2020-09-08 – 2020-09-10 (×3): 8 mg/h via INTRAVENOUS
  Filled 2020-09-08 (×6): qty 80

## 2020-09-08 MED ORDER — PANTOPRAZOLE SODIUM 40 MG IV SOLR: 40.0000 mg | Freq: Two times a day (BID) | INTRAVENOUS | Status: DC

## 2020-09-08 MED ORDER — MOMETASONE FURO-FORMOTEROL FUM 200-5 MCG/ACT IN AERO
2.0000 | INHALATION_SPRAY | Freq: Two times a day (BID) | RESPIRATORY_TRACT | Status: DC
  Administered 2020-09-08 – 2020-09-11 (×6): 2 via RESPIRATORY_TRACT
  Filled 2020-09-08: qty 8.8

## 2020-09-08 MED ORDER — LACTATED RINGERS IV SOLN: INTRAVENOUS | Status: DC

## 2020-09-08 MED ORDER — ALBUTEROL SULFATE HFA 108 (90 BASE) MCG/ACT IN AERS
2.0000 | INHALATION_SPRAY | Freq: Four times a day (QID) | RESPIRATORY_TRACT | Status: DC | PRN
  Filled 2020-09-08: qty 6.7

## 2020-09-08 NOTE — ED Notes (Signed)
Onset 10-12 min after blood started, pt c/o chest pressure 1/10 on pain scale.  Blood paused.  Discussed with Percell Miller PA and Dr. Roslynn Amble, orders to decreased rate on blood transfusion and monitor.  Pt updated and advised if chest pressure worsens, chills, increase in shortness of breath, back pain, or any other symptom to let this nurse immediately.  Pt verbalized understanding.

## 2020-09-08 NOTE — ED Provider Notes (Signed)
[facility] EMERGENCY DEPARTMENT Provider Note   CSN: [id number] Arrival date & time: 09/08/20  1003     History Chief Complaint  Patient presents with  . Dehydration    LAN MCNEILL is a 44 y.o. male.  44 year old male, history of COVID, recently discharged from the hospital on home O2 and decadron, complaint of tachycardia and SHOB, dizziness, chest pain with exertion (improves with ongoing activity).  Symptoms are worse with standing, improved with sitting.  Patient is on his 4 L nasal cannula (baseline).  Reports diarrhea x1 episode after taking a stool softener, now having formed dark stools, last bowel movement today.  Patient states the toilet bowl was black after 1 movement yesterday and again today. Patient recently diagnosed with diabetes, is on insulin and Metformin, has not taken meds today.        Past Medical History:  Diagnosis Date  . Anxiety   . Chicken pox   . Skin cancer of face     Patient Active Problem List   Diagnosis Date Noted  . Pneumonia due to COVID-19 virus 08/15/2020  . Sepsis ([place]) 08/15/2020  . Acute respiratory failure with hypoxia ([place]) 08/15/2020  . Idiopathic gout of ankle 06/04/2020  . Erectile dysfunction 06/04/2020  . Anxiety and depression 06/04/2020  . Diabetes mellitus ([place]) 06/07/2016  . HLD (hyperlipidemia) 11/03/2014    Past Surgical History:  Procedure Laterality Date  . BACK SURGERY    . Neck surgery         Family History  Problem Relation Age of Onset  . Heart disease Mother   . Diabetes Mother   . Parkinson's disease Mother   . Heart disease Father   . Diabetes Father   . Cancer Neg Hx   . Stroke Neg Hx     Social History   Tobacco Use  . Smoking status: Never Smoker  . Smokeless tobacco: Current User    Types: Chew  Substance Use Topics  . Alcohol use: Not Currently    Alcohol/week: 0.0 standard drinks  . Drug use: No    Home Medications Prior to Admission medications     Medication Sig Start Date End Date Taking? Authorizing Provider  albuterol (VENTOLIN HFA) 108 (90 Base) MCG/ACT inhaler Inhale 2 puffs into the lungs every 6 (six) hours as needed for wheezing or shortness of breath. 08/30/20  Yes [place] Eric J, DO  ALPRAZolam (XANAX) 0.5 MG tablet TAKE 1 TABLET BY MOUTH AT BEDTIME AS NEEDED FOR ANXIETY. Patient taking differently: Take 0.5 mg by mouth at bedtime as needed for anxiety.  09/02/20  Yes Baity, Coralie Keens, NP  ascorbic acid (VITAMIN C) 500 MG tablet Take 1 tablet (500 mg total) by mouth daily. 08/31/20 09/30/20 Yes [place] Donnamarie Poag, DO  aspirin 81 MG chewable tablet Chew 1 tablet (81 mg total) by mouth daily. 08/31/20 11/29/20 Yes [place] Eric J, DO  atorvastatin (LIPITOR) 40 MG tablet Take 1 tablet (40 mg total) by mouth daily. 08/31/20 11/29/20 Yes [place] Eric J, DO  fluticasone (FLONASE) 50 MCG/ACT nasal spray Place 2 sprays into both nostrils daily. 08/31/20  Yes [place] Eric J, DO  guaiFENesin-dextromethorphan (ROBITUSSIN DM) 100-10 MG/5ML syrup Take 10 mLs by mouth every 4 (four) hours as needed for cough. 08/30/20  Yes [place] Eric J, DO  insulin glargine (LANTUS) 100 UNIT/ML Solostar Pen Inject 30 Units into the skin daily. 08/30/20 11/28/20 Yes [place] Eric J, DO  insulin lispro (HUMALOG) 200 UNIT/ML KwikPen  Inject 2-10 Units into the skin 3 (three) times daily before meals. 2 units for glucose 150-200, 4 units for glucose 201-250, 6 units for glucose 251-300, 8 units for glucose 301-350, 10 units for glucose 351-400. 08/30/20 11/28/20 Yes [place] Eric J, DO  metFORMIN (GLUCOPHAGE) 500 MG tablet Take 1 tablet (500 mg total) by mouth 2 (two) times daily with a meal. 08/30/20 11/28/20 Yes [place] Eric J, DO  mometasone-formoterol (DULERA) 200-5 MCG/ACT AERO Inhale 2 puffs into the lungs 2 (two) times daily. 08/30/20 09/29/20 Yes [place] Donnamarie Poag, DO  Multiple Vitamin (MULTIVITAMIN) tablet Take 1 tablet by mouth daily.   Yes [provider]  predniSONE (DELTASONE) 10 MG tablet Take 10-30 mg by mouth daily with breakfast. Take 3 tablets (30 mg total) by mouth daily with breakfast for 3 days, THEN 2 tablets (20 mg total) daily with breakfast for 3 days, THEN 1 tablet (10 mg total) daily with breakfast for 3 days.   Yes [provider]  sildenafil (VIAGRA) 25 MG tablet Take 1 tablet (25 mg total) by mouth daily as needed for erectile dysfunction. 06/04/20  Yes Jearld Fenton, NP  zinc sulfate 220 (50 Zn) MG capsule Take 1 capsule (220 mg total) by mouth daily. 08/31/20 09/30/20 Yes [place] Donnamarie Poag, DO  Insulin Pen Needle (PEN NEEDLES 3/16") 31G X 5 MM MISC Use with insulin pen as directed 08/30/20   [place] Eric J, DO  predniSONE (DELTASONE) 10 MG tablet Take 3 tablets (30 mg total) by mouth daily with breakfast for 3 days, THEN 2 tablets (20 mg total) daily with breakfast for 3 days, THEN 1 tablet (10 mg total) daily with breakfast for 3 days. Patient not taking: Reported on 09/08/2020 08/31/20 09/09/20  [place] Eric J, DO    Allergies    Bee venom  Review of Systems   Review of Systems  Constitutional: Negative for chills and fever.  Respiratory: Positive for shortness of breath.   Cardiovascular: Positive for chest pain.  Gastrointestinal: Positive for blood in stool and nausea. Negative for abdominal pain, constipation, diarrhea and vomiting.  Genitourinary: Negative for difficulty urinating and dysuria.  Musculoskeletal: Negative for arthralgias and myalgias.  Skin: Positive for pallor.  Allergic/Immunologic: Positive for immunocompromised state.  Neurological: Positive for weakness.  All other systems reviewed and are negative.   Physical Exam Updated Vital Signs BP 96/64   Pulse 97   Temp 98.3 F (36.8 C) (Oral)   Resp (!) 21   Ht 5\' 11"  (1.803 m)   Wt 95.3 kg   SpO2 99%   BMI 29.29 kg/m   Physical Exam Vitals and nursing note reviewed. Exam conducted with a chaperone present.  Constitutional:       General: He is not in acute distress.    Appearance: He is well-developed. He is not diaphoretic.  HENT:     Head: Normocephalic and atraumatic.  Cardiovascular:     Rate and Rhythm: Regular rhythm. Tachycardia present.     Pulses: Normal pulses.     Heart sounds: Normal heart sounds.  Pulmonary:     Effort: Pulmonary effort is normal.     Breath sounds: Normal breath sounds.  Abdominal:     Palpations: Abdomen is soft.     Tenderness: There is no abdominal tenderness.  Genitourinary:    Rectum: Guaiac result positive.     Comments: Liquid black/grey stool present which is hemoccult positive. Musculoskeletal:     Right lower leg: No edema.  Left lower leg: No edema.  Skin:    Coloration: Skin is pale.  Neurological:     Mental Status: He is alert and oriented to person, place, and time.     Motor: No weakness.  Psychiatric:        Behavior: Behavior normal.     ED Results / Procedures / Treatments   Labs (all labs ordered are listed, but only abnormal results are displayed) Labs Reviewed  COMPREHENSIVE METABOLIC PANEL - Abnormal; Notable for the following components:      Result Value   Glucose, Bld 226 (*)    Calcium 8.5 (*)    Total Protein 5.7 (*)    Albumin 3.1 (*)    All other components within normal limits  CBC - Abnormal; Notable for the following components:   RBC 2.98 (*)    Hemoglobin 9.5 (*)    HCT 28.0 (*)    All other components within normal limits  POC OCCULT BLOOD, ED - Abnormal; Notable for the following components:   Fecal Occult Bld POSITIVE (*)    All other components within normal limits  TYPE AND SCREEN  ABO/RH  PREPARE RBC (CROSSMATCH)  TROPONIN I (HIGH SENSITIVITY)  TROPONIN I (HIGH SENSITIVITY)    EKG EKG Interpretation  Date/Time:  Tuesday September 08 2020 10:08:18 EDT Ventricular Rate:  131 PR Interval:  114 QRS Duration: 72 QT Interval:  284 QTC Calculation: 419 R Axis:   14 Text Interpretation: Sinus tachycardia  Otherwise normal ECG Confirmed by Madalyn Rob [id number] on 09/08/2020 11:10:38 AM   Radiology DG Chest Port 1 View  Result Date: 09/08/2020 CLINICAL DATA:  Weakness, shortness of breath EXAM: PORTABLE CHEST 1 VIEW COMPARISON:  08/26/2020 FINDINGS: The heart size and mediastinal contours are within normal limits. Low lung volumes. Patchy airspace opacities bilaterally with a peripheral and basilar distribution, right worse than left. Overall findings have somewhat improved compared to the prior, particularly within the left lung. No pneumothorax. The visualized skeletal structures are unremarkable. IMPRESSION: Persistent multifocal pneumonia, right worse than left. Overall findings have slightly improved compared to the prior, particularly within the left lung. Electronically Signed   By: Davina Poke D.O.   On: 09/08/2020 11:55    Procedures .Critical Care Performed by: Tacy Learn, PA-C Authorized by: Tacy Learn, PA-C   Critical care provider statement:    Critical care time (minutes):  45   Critical care was time spent personally by me on the following activities:  Discussions with consultants, evaluation of patient's response to treatment, examination of patient, ordering and performing treatments and interventions, ordering and review of laboratory studies, ordering and review of radiographic studies, pulse oximetry, re-evaluation of patient's condition, obtaining history from patient or surrogate and review of old charts   (including critical care time)  Medications Ordered in ED Medications  0.9 %  sodium chloride infusion (has no administration in time range)  pantoprazole (PROTONIX) 80 mg in sodium chloride 0.9 % 100 mL IVPB (has no administration in time range)  sodium chloride 0.9 % bolus 1,000 mL (1,000 mLs Intravenous New Bag/Given 09/08/20 1245)    ED Course  I have reviewed the triage vital signs and the nursing notes.  Pertinent labs & imaging results  that were available during my care of the patient were reviewed by me and considered in my medical decision making (see chart for details).  Clinical Course as of Sep 09 1323  Tue Sep 08, 5040  7745 44 year old male presents  with tachycardia, tachypnea, shortness of breath and dizziness with report of dark stools.  Patient was admitted to hospital October 2-17, discharged on Decadron and takes a baby aspirin daily. Denies abdominal pain.  States that he does have chest pain when he first exerts himself however this resolves with ongoing activity. On exam the patient appears pale, his abdomen is nontender, rectal exam reveals liquid dark/gray stools.  Attempted to obtain orthostatic vitals however patient's initial heart rate of 122, increases to 140 with standing and he becomes very short of breath.  He is unable to tolerate standing long enough for blood pressure check. Suspect symptomatic anemia, patient's hemoglobin has dropped from 14 of discharge to 9.5. Patient was given IV fluids, heart rate has improved at rest, blood pressure remains soft but stable.  Plan is to give protonix, transfuse (1 unit ordered), admit to hospitalist with GI consult. Case discussed with Azucena Freed, PA-C with [facility] who will consult. Case discussed with Dr. Sabino Gasser with [facility] who will consult for admission.    [LM]    Clinical Course User Index [LM] Roque Lias   MDM Rules/Calculators/A&P                          Final Clinical Impression(s) / ED Diagnoses Final diagnoses:  Gastrointestinal hemorrhage, unspecified gastrointestinal hemorrhage type  Symptomatic anemia    Rx / DC Orders ED Discharge Orders    None       Tacy Learn, PA-C 09/08/20 1324    Lucrezia Starch, MD 09/09/20 2355

## 2020-09-08 NOTE — Telephone Encounter (Addendum)
I spoke with pt; EMS came out and said pt was dehydrated; pt is tired,dry mouth, last urinated this morning and was normal amt of urine;urine color was not noted.pt said he wants to wait and see R Baity today at 12:15. Heart rate now is 111 at rest.usually 94-110 heart rate. Upon exertion heart rate goes to 170. No CP,H/A or SOB; no abd pain and no fever. ED precautions given and pt voiced understanding.pt is trying to drink more fluids.pt having dizziness on and off since d/c from hospital.pt had normal BM today except color was dark ? Tarry green. Last nose bleed was end of last wk but thinks related to oxygen drying out nose. Pt said when he gets to appt he will call as instructed and pt is requesting a w/c so his heart rate will not go up. FYI to Avie Echevaria NP and [facility] CMA. I spoke with Avie Echevaria NP and she said if pt is this sick he needs to go to ED now.appt with Avie Echevaria NP cancelled for today. Pt is by himself but as long as resting he said he will be OK; pt asked that I call 911 for him and I did that and a EMS unit has been dispatched to pts home and pt is aware and front door is unlocked. Pt is aware if condition worsens to call 911 back and pt is also calling his father to let him be aware of what is going on. FYI to Avie Echevaria NP.

## 2020-09-08 NOTE — Telephone Encounter (Signed)
[facility] Night - Client Nonclinical Telephone Record [facility] Client [facility] Night - Client Client Site [place] Physician Webb Silversmith - NP Contact Type Call Who Is Calling Physician / Provider / Hospital Call Type Provider Call Message Only Reason for Call Request to send message to Office Initial Comment Caller states [place], [facility]. Called patient to schedule him tomorrow and the patient has a high heart rate, sweating, having some nose bleeds and dark stool. She advised him to go to the ER. Additional Comment Provided office hours. The patient is Joshua [facility], DOB [birth year]. Sent to ER by home health. Disp. Time Disposition Final User 09/07/2020 7:46:45 PM General Information Provided Yes Clayton Bibles Call Closed By: Clayton Bibles Transaction Date/Time: 09/07/2020 7:42:39 PM (ET)

## 2020-09-08 NOTE — Assessment & Plan Note (Signed)
-   recent new diagnosis this past hospitalization for patient; he does complain of some subtle lower extremity neuropathy -Recent A1c 10.8% on 08/15/2020 -Has been on Lantus 30 units daily and SSI, as well as steroids outpatient -He is now done with steroids and currently n.p.o.; will reduce basal dose for now to 10 units and evaluate response and modify as needed -Continue q4h SSI while NPO; once on a diet again can change to [facility]

## 2020-09-08 NOTE — ED Notes (Signed)
Blood bank called, stated lab put order in for another blood tube to be collected.

## 2020-09-08 NOTE — Assessment & Plan Note (Addendum)
-   diagnosed on 10/1; discharged on 10/17 on 4 L nasal cannula oxygen -Continue O2 and wean as able -Continue inhalers -Steroid course complete -CXR shows some improvement from last, notably on left side

## 2020-09-08 NOTE — Telephone Encounter (Signed)
Agree with advice given

## 2020-09-08 NOTE — Assessment & Plan Note (Signed)
-  Given recent COVID-19 pneumonia, ongoing use of steroids, suspect a stress ulcer at this time.  Hemoglobin dropped approximately 5 g over the past 2 weeks.  He denies any bright red blood, mostly gray/melena.  Vitals are borderline with hypotension and tachycardia -Appropriate for 1 unit PRBC at this time and will monitor her hemodynamic response, and transfuse further if needed -Repeat H/H 1 unit after transfusion complete - s/p Protonix 80 mg bolus in ER. Will start on continuous protonix infusion given suspicion of underlying PUD - appreciate GI consult, follow up rec's and plan - trend H/H as necessary - start IVF; watch for volume overload/respi decline given underlying recovering Covid pna

## 2020-09-08 NOTE — Progress Notes (Signed)
Patient to room 4E21 from ED. Vital signs obtained. CHG bath completed. Alert and oriented to room and call light. Call bell within reach.  Era Bumpers, RN

## 2020-09-08 NOTE — H&P (Signed)
History and Physical    Joshua Bean  JIR:[id number]  DOB: Mar 28, 1976  DOA: 09/08/2020  PCP: Jearld Fenton, NP Patient coming from: home  Chief Complaint: dark stools  HPI:  Mr. Peel is a 44 yo CM with PMH recent COVID pna (dx 10/1 and discharged 10/17 on 4L O2), new diagnosis DMII (A1c 10.8%), anxiety, hyperlipidemia who presented to the ER with approximately 4-day complaint of dark stools at home as well as increasing heart rate.  He was told to present to the ER for further evaluation due to this. He has been home recovering relatively well from COVID-19, however still on 4 L oxygen.  He was also recently started on insulins in setting of his new diagnosis of type 2 diabetes. He does not have complaints of GERD/indigestion and does not take any PPI or similar.  He also denies any tobacco use, alcohol use, NSAID use. He did endorse feeling lower abdominal pain/cramping just prior to onset of passing dark stools.  He denies any significant nausea nor vomiting. He was discharged on a prednisone taper that has been completed this week.  Baseline hemoglobin is around 14 g/dL and on arrival was found to be 9.5 g/dL. Notable vitals include temp 98.1, pulse 140, respirations 26, BP 102/83, 94% on 4 L. Chemistries relatively unremarkable FOBT positive  A CXR was performed in the ER which revealed persistent interstitial infiltrates as expected from recent and resolving COVID-19 pneumonia, however there was some improvement noted on the left side.  He was given 1 L normal saline bolus in the ER and Protonix 80 mg IVPB x1. GI was consulted for presumed GI bleed.   I have personally briefly reviewed patient's old medical records in [facility] and discussed patient with the ER provider when appropriate/indicated.  Assessment/Plan: * GIB (gastrointestinal bleeding) -Given recent COVID-19 pneumonia, ongoing use of steroids, suspect a stress ulcer at this time.  Hemoglobin dropped  approximately 5 g over the past 2 weeks.  He denies any bright red blood, mostly gray/melena.  Vitals are borderline with hypotension and tachycardia -Appropriate for 1 unit PRBC at this time and will monitor her hemodynamic response, and transfuse further if needed -Repeat H/H 1 unit after transfusion complete - s/p Protonix 80 mg bolus in ER. Will start on continuous protonix infusion given suspicion of underlying PUD - appreciate GI consult, follow up rec's and plan - trend H/H as necessary - start IVF; watch for volume overload/respi decline given underlying recovering Covid pna  History of COVID-19 - diagnosed on 10/1; discharged on 10/17 on 4 L nasal cannula oxygen -Continue O2 and wean as able -Continue inhalers -Steroid course complete -CXR shows some improvement from last, notably on left side  Diabetes mellitus ([place]) - recent new diagnosis this past hospitalization for patient; he does complain of some subtle lower extremity neuropathy -Recent A1c 10.8% on 08/15/2020 -Has been on Lantus 30 units daily and SSI, as well as steroids outpatient -He is now done with steroids and currently n.p.o.; will reduce basal dose for now to 10 units and evaluate response and modify as needed -Continue q4h SSI while NPO; once on a diet again can change to ACHS  Anxiety and depression -States he takes Xanax at home but no recent fills since July 2021 -For now, will continue on Xanax in setting of avoiding further anxiety/stress until GI bleed further evaluated -Would probably benefit from chronic maintenance medication, as I do not see one listed on med rec  HLD (hyperlipidemia) -Uncontrolled lipid profile on last check -Hold Lipitor for now but resume once GI bleed work-up complete     Code Status: Full DVT Prophylaxis: SCD Anticipated disposition is to: home  History: Past Medical History:  Diagnosis Date  . Anxiety   . Chicken pox   . Skin cancer of face     Past Surgical  History:  Procedure Laterality Date  . BACK SURGERY    . Neck surgery       reports that he has never smoked. His smokeless tobacco use includes chew. He reports previous alcohol use. He reports that he does not use drugs.  Allergies  Allergen Reactions  . Bee Venom Anaphylaxis    Family History  Problem Relation Age of Onset  . Heart disease Mother   . Diabetes Mother   . Parkinson's disease Mother   . Heart disease Father   . Diabetes Father   . Cancer Neg Hx   . Stroke Neg Hx    Home Medications: Prior to Admission medications   Medication Sig Start Date End Date Taking? Authorizing Provider  albuterol (VENTOLIN HFA) 108 (90 Base) MCG/ACT inhaler Inhale 2 puffs into the lungs every 6 (six) hours as needed for wheezing or shortness of breath. 08/30/20  Yes [place] Eric J, DO  ALPRAZolam (XANAX) 0.5 MG tablet TAKE 1 TABLET BY MOUTH AT BEDTIME AS NEEDED FOR ANXIETY. Patient taking differently: Take 0.5 mg by mouth at bedtime as needed for anxiety.  09/02/20  Yes Baity, Coralie Keens, NP  ascorbic acid (VITAMIN C) 500 MG tablet Take 1 tablet (500 mg total) by mouth daily. 08/31/20 09/30/20 Yes [place] Donnamarie Poag, DO  aspirin 81 MG chewable tablet Chew 1 tablet (81 mg total) by mouth daily. 08/31/20 11/29/20 Yes [place] Eric J, DO  atorvastatin (LIPITOR) 40 MG tablet Take 1 tablet (40 mg total) by mouth daily. 08/31/20 11/29/20 Yes [place] Eric J, DO  fluticasone (FLONASE) 50 MCG/ACT nasal spray Place 2 sprays into both nostrils daily. 08/31/20  Yes [place] Eric J, DO  guaiFENesin-dextromethorphan (ROBITUSSIN DM) 100-10 MG/5ML syrup Take 10 mLs by mouth every 4 (four) hours as needed for cough. 08/30/20  Yes [place] Eric J, DO  insulin glargine (LANTUS) 100 UNIT/ML Solostar Pen Inject 30 Units into the skin daily. 08/30/20 11/28/20 Yes [place] Eric J, DO  insulin lispro (HUMALOG) 200 UNIT/ML KwikPen Inject 2-10 Units into the skin 3 (three) times daily before meals. 2 units for glucose  150-200, 4 units for glucose 201-250, 6 units for glucose 251-300, 8 units for glucose 301-350, 10 units for glucose 351-400. 08/30/20 11/28/20 Yes [place] Eric J, DO  metFORMIN (GLUCOPHAGE) 500 MG tablet Take 1 tablet (500 mg total) by mouth 2 (two) times daily with a meal. 08/30/20 11/28/20 Yes [place] Eric J, DO  mometasone-formoterol (DULERA) 200-5 MCG/ACT AERO Inhale 2 puffs into the lungs 2 (two) times daily. 08/30/20 09/29/20 Yes [place] Donnamarie Poag, DO  Multiple Vitamin (MULTIVITAMIN) tablet Take 1 tablet by mouth daily.   Yes [provider]  predniSONE (DELTASONE) 10 MG tablet Take 10-30 mg by mouth daily with breakfast. Take 3 tablets (30 mg total) by mouth daily with breakfast for 3 days, THEN 2 tablets (20 mg total) daily with breakfast for 3 days, THEN 1 tablet (10 mg total) daily with breakfast for 3 days.   Yes [provider]  sildenafil (VIAGRA) 25 MG tablet Take 1 tablet (25 mg total) by mouth daily as needed for erectile dysfunction. 06/04/20  Yes Jearld Fenton, NP  zinc sulfate 220 (50 Zn) MG capsule Take 1 capsule (220 mg total) by mouth daily. 08/31/20 09/30/20 Yes [place] Eric J, DO  Insulin Pen Needle (PEN NEEDLES 3/16") 31G X 5 MM MISC Use with insulin pen as directed 08/30/20   [place] Donnamarie Poag, DO    Review of Systems:  Pertinent items noted in HPI and remainder of comprehensive ROS otherwise negative.  Physical Exam: Vitals:   09/08/20 1014 09/08/20 1115 09/08/20 1310  BP: 102/83 103/83 96/64  Pulse: (!) 140 (!) 122 97  Resp: (!) 26  (!) 21  Temp: 98.1 F (36.7 C)  98.3 F (36.8 C)  TempSrc: Oral  Oral  SpO2: 94% 99%   Weight: 95.3 kg    Height: 5\' 11"  (1.803 m)     General appearance: Pleasant adult man laying in bed in no obvious distress.  Family bedside Head: Normocephalic, without obvious abnormality, atraumatic Eyes: EOMI Lungs: Scattered coarse breath sounds bilaterally, worse on right.  No obvious wheezing Heart: Tachycardic, regular  rhythm, S1-S2 present Abdomen: normal findings: bowel sounds normal, soft, non-tender and Obese Extremities: No edema Skin: mobility and turgor normal Neurologic: Grossly normal  Labs on Admission:  I have personally reviewed following labs and imaging studies Results for orders placed or performed during the hospital encounter of 09/08/20 (from the past 24 hour(s))  Comprehensive metabolic panel     Status: Abnormal   Collection Time: 09/08/20 10:19 AM  Result Value Ref Range   Sodium 135 135 - 145 mmol/L   Potassium 3.6 3.5 - 5.1 mmol/L   Chloride 101 98 - 111 mmol/L   CO2 24 22 - 32 mmol/L   Glucose, Bld 226 (H) 70 - 99 mg/dL   BUN 16 6 - 20 mg/dL   Creatinine, Ser 0.68 0.61 - 1.24 mg/dL   Calcium 8.5 (L) 8.9 - 10.3 mg/dL   Total Protein 5.7 (L) 6.5 - 8.1 g/dL   Albumin 3.1 (L) 3.5 - 5.0 g/dL   AST 15 15 - 41 U/L   ALT 26 0 - 44 U/L   Alkaline Phosphatase 53 38 - 126 U/L   Total Bilirubin 0.9 0.3 - 1.2 mg/dL   GFR, Estimated >60 >60 mL/min   Anion gap 10 5 - 15  CBC     Status: Abnormal   Collection Time: 09/08/20 10:19 AM  Result Value Ref Range   WBC 5.9 4.0 - 10.5 K/uL   RBC 2.98 (L) 4.22 - 5.81 MIL/uL   Hemoglobin 9.5 (L) 13.0 - 17.0 g/dL   HCT 28.0 (L) 39 - 52 %   MCV 94.0 80.0 - 100.0 fL   MCH 31.9 26.0 - 34.0 pg   MCHC 33.9 30.0 - 36.0 g/dL   RDW 14.8 11.5 - 15.5 %   Platelets 240 150 - 400 K/uL   nRBC 0.0 0.0 - 0.2 %  Type and screen [place]     Status: None (Preliminary result)   Collection Time: 09/08/20 10:19 AM  Result Value Ref Range   ABO/RH(D) O POS    Antibody Screen NEG    Sample Expiration 09/11/2020,2359    Unit Number [id number]    Blood Component Type RED CELLS,LR    Unit division 00    Status of Unit ISSUED    Transfusion Status OK TO TRANSFUSE    Crossmatch Result      Compatible Performed at [facility], [address], [place], [place]  [address]   Prepare RBC (crossmatch)     Status: None   Collection  Time: 09/08/20 11:31 AM  Result Value Ref Range   Order Confirmation      ORDER PROCESSED BY BLOOD BANK Performed at [facility], [address] [place], [place] [address]   ABO/Rh     Status: None   Collection Time: 09/08/20 12:20 PM  Result Value Ref Range   ABO/RH(D)      O POS Performed at [facility] [address], [place],  [address]   POC occult blood, ED     Status: Abnormal   Collection Time: 09/08/20 12:24 PM  Result Value Ref Range   Fecal Occult Bld POSITIVE (A) NEGATIVE  Troponin I (High Sensitivity)     Status: None   Collection Time: 09/08/20 12:25 PM  Result Value Ref Range   Troponin I (High Sensitivity) 5 <18 ng/L     Radiological Exams on Admission: DG Chest Port 1 View  Result Date: 09/08/2020 CLINICAL DATA:  Weakness, shortness of breath EXAM: PORTABLE CHEST 1 VIEW COMPARISON:  08/26/2020 FINDINGS: The heart size and mediastinal contours are within normal limits. Low lung volumes. Patchy airspace opacities bilaterally with a peripheral and basilar distribution, right worse than left. Overall findings have somewhat improved compared to the prior, particularly within the left lung. No pneumothorax. The visualized skeletal structures are unremarkable. IMPRESSION: Persistent multifocal pneumonia, right worse than left. Overall findings have slightly improved compared to the prior, particularly within the left lung. Electronically Signed   By: Davina Poke D.O.   On: 09/08/2020 11:55   DG Chest [facility]  Final Result      Consults called:  Gastroenterology  EKG: Independently reviewed. Sinus tach   Dwyane Dee, MD [facility] 09/08/2020, 2:06 PM

## 2020-09-08 NOTE — Consult Note (Addendum)
[facility] Consult: 1:19 PM 09/08/2020  LOS: 0 days    Referring Provider: Barnie Alderman in ED Primary Care Physician:  Jearld Fenton, NP Primary Gastroenterologist:  None    Reason for Consultation: FOBT positive dark stools, anemia   HPI: Joshua Bean is a 44 y.o. male.  PMH DM2.  HLD.  Anxiety.   No previous GI issues and has never had any endoscopic studies or seen a GI specialist.  Admission w Covid 19 PNA 10/1 - 08/30/20.  Had not been vaccinated.  Treated w Remdesivir,  baricitinib, high dose Solumedrol.  Completed the 13 d of required isolation.  Noted to be significantly debilitated so no return to work date set per d/c summary, went home on Prednisone taper, low-dose aspirin, home [place] oxygen and new insulin.  Hgb A1C was 10.8 c/w poor diabetic control.    SOB not much improved since going home, worse in last few days w clear/frothy sputum.  Reported to his PMD that he had had a dried clot of blood which, after softening up had passed and had epistaxis about a week ago.  For a couple of days after that saw some blood when he would blow his nose but no recurrent true epistaxis.  Starting this past Sunday, 3 days ago, he passed a soft/liquid black stool.  He had taken Colace the day before as he is been a bit more constipated lately.  He also saw a dark but less liquid stool on Monday and again this morning when he passed a formed, dark stool.  Has not had any GI upset.  His appetite is good does report at least 15 pound weight loss since he was admitted with the COVID-19.  No dysphagia.  No heartburn.  No abdominal pain.  In the week before he was admitted on 10/1, he was alternating Tylenol with ibuprofen to manage fevers but normally does not use ibuprofen much at all as well as no aspirin or Goody  powders.  Has gotten more short of breath and a little bit dizzy today.  Called EMS and was brought to the hospital.   10/26 EMS saw pt at his home and concerned re dehydration.  HR 111 at rest.   Hgb 14 >> 9.5 since discharge.  Hgb as high as 17 in mid July and 15.9 at admission 10/1 MCV 94.    Family history Negative for ulcer disease, GI bleeds, colorectal or gastric cancers, liver disease.  Social history Patient does not drink alcohol.  Chews but does not smoke tobacco.  Lives alone.  His sister has been helping him out by bringing him food since he returned from the hospital.    Past Medical History:  Diagnosis Date  . Anxiety   . Chicken pox   . Skin cancer of face     Past Surgical History:  Procedure Laterality Date  . BACK SURGERY    . Neck surgery      Prior to Admission medications   Medication Sig Start Date End Date Taking? Authorizing Provider  albuterol (VENTOLIN HFA) 108 (90 Base) MCG/ACT inhaler Inhale 2 puffs into the lungs every 6 (six) hours as needed for wheezing or shortness of breath. 08/30/20  Yes [place] Eric J, DO  ALPRAZolam (XANAX) 0.5 MG tablet TAKE 1 TABLET BY MOUTH AT BEDTIME AS NEEDED FOR ANXIETY. Patient taking differently: Take 0.5 mg by mouth at bedtime as needed for anxiety.  09/02/20  Yes Baity, Coralie Keens, NP  ascorbic acid (VITAMIN C) 500 MG tablet Take 1 tablet (500 mg total) by mouth daily. 08/31/20 09/30/20 Yes [place] Donnamarie Poag, DO  aspirin 81 MG chewable tablet Chew 1 tablet (81 mg total) by mouth daily. 08/31/20 11/29/20 Yes [place] Eric J, DO  atorvastatin (LIPITOR) 40 MG tablet Take 1 tablet (40 mg total) by mouth daily. 08/31/20 11/29/20 Yes [place] Eric J, DO  fluticasone (FLONASE) 50 MCG/ACT nasal spray Place 2 sprays into both nostrils daily. 08/31/20  Yes [place] J, DO  guaiFENesin-dextromethorphan (ROBITUSSIN DM) 100-10 MG/5ML syrup Take 10 mLs by mouth every 4 (four) hours as needed for cough. 08/30/20  Yes [place] Eric J,  DO  insulin glargine (LANTUS) 100 UNIT/ML Solostar Pen Inject 30 Units into the skin daily. 08/30/20 11/28/20 Yes [place] Eric J, DO  insulin lispro (HUMALOG) 200 UNIT/ML KwikPen Inject 2-10 Units into the skin 3 (three) times daily before meals. 2 units for glucose 150-200, 4 units for glucose 201-250, 6 units for glucose 251-300, 8 units for glucose 301-350, 10 units for glucose 351-400. 08/30/20 11/28/20 Yes [place] Eric J, DO  metFORMIN (GLUCOPHAGE) 500 MG tablet Take 1 tablet (500 mg total) by mouth 2 (two) times daily with a meal. 08/30/20 11/28/20 Yes [place] J, DO  mometasone-formoterol (DULERA) 200-5 MCG/ACT AERO Inhale 2 puffs into the lungs 2 (two) times daily. 08/30/20 09/29/20 Yes [place] Donnamarie Poag, DO  Multiple Vitamin (MULTIVITAMIN) tablet Take 1 tablet by mouth daily.   Yes [provider]  predniSONE (DELTASONE) 10 MG tablet Take 10-30 mg by mouth daily with breakfast. Take 3 tablets (30 mg total) by mouth daily with breakfast for 3 days, THEN 2 tablets (20 mg total) daily with breakfast for 3 days, THEN 1 tablet (10 mg total) daily with breakfast for 3 days.   Yes [provider]  sildenafil (VIAGRA) 25 MG tablet Take 1 tablet (25 mg total) by mouth daily as needed for erectile dysfunction. 06/04/20  Yes Jearld Fenton, NP  zinc sulfate 220 (50 Zn) MG capsule Take 1 capsule (220 mg total) by mouth daily. 08/31/20 09/30/20 Yes [place] Donnamarie Poag, DO  Insulin Pen Needle (PEN NEEDLES 3/16") 31G X 5 MM MISC Use with insulin pen as directed 08/30/20   [place] Eric J, DO  predniSONE (DELTASONE) 10 MG tablet Take 3 tablets (30 mg total) by mouth daily with breakfast for 3 days, THEN 2 tablets (20 mg total) daily with breakfast for 3 days, THEN 1 tablet (10 mg total) daily with breakfast for 3 days. Patient not taking: Reported on 09/08/2020 08/31/20 09/09/20  [place] Eric J, DO    Scheduled Meds:  Infusions: . sodium chloride    . pantoprazole (PROTONIX) IV     PRN  Meds:    Allergies as of 09/08/2020 - Review Complete 09/08/2020  Allergen Reaction Noted  . Bee venom Anaphylaxis 06/14/2014    Family History  Problem Relation Age of Onset  . Heart disease Mother   . Diabetes Mother   . Parkinson's disease Mother   . Heart disease Father   .  Diabetes Father   . Cancer Neg Hx   . Stroke Neg Hx     Social History   Socioeconomic History  . Marital status: Married    Spouse name: Not on file  . Number of children: Not on file  . Years of education: Not on file  . Highest education level: Not on file  Occupational History  . Not on file  Tobacco Use  . Smoking status: Never Smoker  . Smokeless tobacco: Current User    Types: Chew  Substance and Sexual Activity  . Alcohol use: Not Currently    Alcohol/week: 0.0 standard drinks  . Drug use: No  . Sexual activity: Yes  Other Topics Concern  . Not on file  Social History Narrative  . Not on file   Social Determinants of Health   Financial Resource Strain:   . Difficulty of Paying Living Expenses: Not on file  Food Insecurity:   . Worried About Charity fundraiser in the Last Year: Not on file  . Ran Out of Food in the Last Year: Not on file  Transportation Needs:   . Lack of Transportation (Medical): Not on file  . Lack of Transportation (Non-Medical): Not on file  Physical Activity:   . Days of Exercise per Week: Not on file  . Minutes of Exercise per Session: Not on file  Stress:   . Feeling of Stress : Not on file  Social Connections:   . Frequency of Communication with Friends and Family: Not on file  . Frequency of Social Gatherings with Friends and Family: Not on file  . Attends Religious Services: Not on file  . Active Member of Clubs or Organizations: Not on file  . Attends Archivist Meetings: Not on file  . Marital Status: Not on file  Intimate Partner Violence:   . Fear of Current or Ex-Partner: Not on file  . Emotionally Abused: Not on file  .  Physically Abused: Not on file  . Sexually Abused: Not on file    REVIEW OF SYSTEMS: Constitutional: Malaise, weakness. ENT:  No nose bleeds Pulm: Per HPI. CV:  No palpitations, no LE edema.  No angina GU:  No hematuria, no frequency GI: See HPI Heme: Denies unusual or excessive bleeding or bruising. Transfusions: None Neuro:  No headaches, no peripheral tingling or numbness.  No seizures, no syncope. Derm:  No itching, no rash or sores.  Endocrine:  No sweats or chills.  No polyuria or dysuria Immunization: Not queried.    PHYSICAL EXAM: Vital signs in last 24 hours: Vitals:   09/08/20 1115 09/08/20 1310  BP: 103/83 96/64  Pulse: (!) 122 97  Resp:  (!) 21  Temp:  98.3 F (36.8 C)  SpO2: 99%    Wt Readings from Last 3 Encounters:  09/08/20 95.3 kg  08/14/20 95.3 kg  06/04/20 (!) 95.3 kg    General: Slightly overweight, pale and mildly ill-appearing but alert and comfortable Head: No facial asymmetry or swelling.  No signs of head trauma. Eyes: Conjunctiva is pale Ears: Not hard of hearing Nose: No congestion, no dried blood. Mouth: Good dentition.  Tongue midline.  Mucosa moist, pink, clear. Neck: No JVD, no masses, no thyromegaly Lungs: Diminished breath sounds bilaterally.  No labored breathing on nasal cannula oxygen during speech.  No cough observed Heart: Regular.  Not currently tachycardic.  No MRG.  S1, S2 present Abdomen: Soft without tenderness.  No HSM, masses, bruits, hernias.  Bowel sounds  active..   Rectal: Not repeated.  DRE performed by PA revealed liquid black/gray stool which was Hemoccult positive.  No masses. Musc/Skeltl: No joint swelling, redness or gross deformity. Extremities: No CCE. Neurologic: Alert.  Oriented x3.  Moves all 4 limbs, strength not tested.  No tremor or gross deficits. Skin: No rash, no sores, no telangiectasia Nodes: No cervical adenopathy Psych: Cooperative, calm, pleasant.  Intake/Output from previous day: No  intake/output data recorded. Intake/Output this shift: No intake/output data recorded.  LAB RESULTS: Recent Labs    09/08/20 1019  WBC 5.9  HGB 9.5*  HCT 28.0*  PLT 240   BMET Lab Results  Component Value Date   NA 135 09/08/2020   NA 137 08/26/2020   NA 136 08/23/2020   K 3.6 09/08/2020   K 3.4 (L) 08/26/2020   K 3.7 08/23/2020   CL 101 09/08/2020   CL 102 08/26/2020   CL 101 08/23/2020   CO2 24 09/08/2020   CO2 26 08/26/2020   CO2 23 08/23/2020   GLUCOSE 226 (H) 09/08/2020   GLUCOSE 96 08/26/2020   GLUCOSE 106 (H) 08/23/2020   BUN 16 09/08/2020   BUN 13 08/26/2020   BUN 12 08/23/2020   CREATININE 0.68 09/08/2020   CREATININE 0.67 08/26/2020   CREATININE 0.68 08/23/2020   CALCIUM 8.5 (L) 09/08/2020   CALCIUM 8.6 (L) 08/26/2020   CALCIUM 8.5 (L) 08/23/2020   LFT Recent Labs    09/08/20 1019  PROT 5.7*  ALBUMIN 3.1*  AST 15  ALT 26  ALKPHOS 53  BILITOT 0.9   PT/INR No results found for: INR, PROTIME Hepatitis Panel No results for input(s): HEPBSAG, HCVAB, HEPAIGM, HEPBIGM in the last 72 hours. C-Diff No components found for: CDIFF Lipase  No results found for: LIPASE  Drugs of Abuse  No results found for: LABOPIA, COCAINSCRNUR, LABBENZ, AMPHETMU, THCU, LABBARB   RADIOLOGY STUDIES: DG Chest Port 1 View  Result Date: 09/08/2020 CLINICAL DATA:  Weakness, shortness of breath EXAM: PORTABLE CHEST 1 VIEW COMPARISON:  08/26/2020 FINDINGS: The heart size and mediastinal contours are within normal limits. Low lung volumes. Patchy airspace opacities bilaterally with a peripheral and basilar distribution, right worse than left. Overall findings have somewhat improved compared to the prior, particularly within the left lung. No pneumothorax. The visualized skeletal structures are unremarkable. IMPRESSION: Persistent multifocal pneumonia, right worse than left. Overall findings have slightly improved compared to the prior, particularly within the left lung.  Electronically Signed   By: Davina Poke D.O.   On: 09/08/2020 11:55      IMPRESSION:   *   Dark, FOBT + stools R/o PUD.  *    Acute anemia suspect due to GI blood loss.  1 PRBC ordered and transfusing.  Did have limited epistaxis 8 d ago.    *    COVID-19 pneumonia in unvaccinated patient.  Tested +10/1.  Admitted 10/1 -10/17.  2-week quarantine completed.  Discharged on prednisone taper.  Last dose of prednisone was due today but he has not taken it. Currently experiencing tachycardia.  Chest x-ray shows persistent right greater than left multifocal pneumonia with slight improvement compared with previous.  *   IDDM, type 2.  Insulin added during recent admission.     PLAN:     *     EGD tomorrow. Continue Protonix drip which has been started in the ED Complete the PRBC transfusion which is currently infusing.  CBC in AM.   Allow clear liquids but NPO  for several hours prior to endoscopy tmrw.   Azucena Freed  09/08/2020, 1:19 PM Phone [place] Attending   I have taken an interval history, reviewed the chart and examined the patient. I agree with the Advanced Practitioner's note, impression and recommendations.   Melena - upper GI bleed  Also having some epistaxis ? Contribution.  Plan for EGD tomorrow - (hopefully - schedule permitting)   Gatha Mayer, MD, [facility] 09/08/2020 5:05 PM

## 2020-09-08 NOTE — Hospital Course (Signed)
Joshua Bean is a 44 yo CM with PMH recent COVID pna (dx 10/1 and discharged 10/17 on 4L O2), new diagnosis DMII (A1c 10.8%), anxiety, hyperlipidemia who presented to the ER with approximately 4-day complaint of dark stools at home as well as increasing heart rate.  He was told to present to the ER for further evaluation due to this. He has been home recovering relatively well from COVID-19, however still on 4 L oxygen.  He was also recently started on insulins in setting of his new diagnosis of type 2 diabetes. He does not have complaints of GERD/indigestion and does not take any PPI or similar.  He also denies any tobacco use, alcohol use, NSAID use. He did endorse feeling lower abdominal pain/cramping just prior to onset of passing dark stools.  He denies any significant nausea nor vomiting. He was discharged on a prednisone taper that has been completed this week.  Baseline hemoglobin is around 14 g/dL and on arrival was found to be 9.5 g/dL. Notable vitals include temp 98.1, pulse 140, respirations 26, BP 102/83, 94% on 4 L. Chemistries relatively unremarkable FOBT positive  A CXR was performed in the ER which revealed persistent interstitial infiltrates as expected from recent and resolving COVID-19 pneumonia, however there was some improvement noted on the left side.  He was given 1 L normal saline bolus in the ER and Protonix 80 mg IVPB x1. GI was consulted for presumed GI bleed.

## 2020-09-08 NOTE — Assessment & Plan Note (Addendum)
-  States he takes Xanax at home but no recent fills since July 2021 -For now, will continue on Xanax in setting of avoiding further anxiety/stress until GI bleed further evaluated -Would probably benefit from chronic maintenance medication, as I do not see one listed on med rec

## 2020-09-08 NOTE — Assessment & Plan Note (Signed)
-  Uncontrolled lipid profile on last check -Hold Lipitor for now but resume once GI bleed work-up complete

## 2020-09-08 NOTE — ED Triage Notes (Signed)
Pt arrives via EMS from home with reports of possible dehydration due to dizziness when standing. Recently discharged due to covid PNA and on home O2 of 4L. Pt has not taken any daily meds today. Pt endorses 2 dark stools over the past 3 days.   95% on 4L 120 HR 130/72 235 CBG

## 2020-09-08 NOTE — H&P (View-Only) (Signed)
[facility] Consult: 1:19 PM 09/08/2020  LOS: 0 days    Referring Provider: Barnie Alderman in ED Primary Care Physician:  Jearld Fenton, NP Primary Gastroenterologist:  None    Reason for Consultation: FOBT positive dark stools, anemia   HPI: Joshua Bean is a 44 y.o. male.  PMH DM2.  HLD.  Anxiety.   No previous GI issues and has never had any endoscopic studies or seen a GI specialist.  Admission w Covid 19 PNA 10/1 - 08/30/20.  Had not been vaccinated.  Treated w Remdesivir,  baricitinib, high dose Solumedrol.  Completed the 13 d of required isolation.  Noted to be significantly debilitated so no return to work date set per d/c summary, went home on Prednisone taper, low-dose aspirin, home [place] oxygen and new insulin.  Hgb A1C was 10.8 c/w poor diabetic control.    SOB not much improved since going home, worse in last few days w clear/frothy sputum.  Reported to his PMD that he had had a dried clot of blood which, after softening up had passed and had epistaxis about a week ago.  For a couple of days after that saw some blood when he would blow his nose but no recurrent true epistaxis.  Starting this past Sunday, 3 days ago, he passed a soft/liquid black stool.  He had taken Colace the day before as he is been a bit more constipated lately.  He also saw a dark but less liquid stool on Monday and again this morning when he passed a formed, dark stool.  Has not had any GI upset.  His appetite is good does report at least 15 pound weight loss since he was admitted with the COVID-19.  No dysphagia.  No heartburn.  No abdominal pain.  In the week before he was admitted on 10/1, he was alternating Tylenol with ibuprofen to manage fevers but normally does not use ibuprofen much at all as well as no aspirin or Goody  powders.  Has gotten more short of breath and a little bit dizzy today.  Called EMS and was brought to the hospital.   10/26 EMS saw pt at his home and concerned re dehydration.  HR 111 at rest.   Hgb 14 >> 9.5 since discharge.  Hgb as high as 17 in mid July and 15.9 at admission 10/1 MCV 94.    Family history Negative for ulcer disease, GI bleeds, colorectal or gastric cancers, liver disease.  Social history Patient does not drink alcohol.  Chews but does not smoke tobacco.  Lives alone.  His sister has been helping him out by bringing him food since he returned from the hospital.    Past Medical History:  Diagnosis Date  . Anxiety   . Chicken pox   . Skin cancer of face     Past Surgical History:  Procedure Laterality Date  . BACK SURGERY    . Neck surgery      Prior to Admission medications   Medication Sig Start Date End Date Taking? Authorizing Provider  albuterol (VENTOLIN HFA) 108 (90 Base) MCG/ACT inhaler Inhale 2 puffs into the lungs every 6 (six) hours as needed for wheezing or shortness of breath. 08/30/20  Yes [place] Eric J, DO  ALPRAZolam (XANAX) 0.5 MG tablet TAKE 1 TABLET BY MOUTH AT BEDTIME AS NEEDED FOR ANXIETY. Patient taking differently: Take 0.5 mg by mouth at bedtime as needed for anxiety.  09/02/20  Yes Baity, Coralie Keens, NP  ascorbic acid (VITAMIN C) 500 MG tablet Take 1 tablet (500 mg total) by mouth daily. 08/31/20 09/30/20 Yes [place] Donnamarie Poag, DO  aspirin 81 MG chewable tablet Chew 1 tablet (81 mg total) by mouth daily. 08/31/20 11/29/20 Yes [place] Eric J, DO  atorvastatin (LIPITOR) 40 MG tablet Take 1 tablet (40 mg total) by mouth daily. 08/31/20 11/29/20 Yes [place] Eric J, DO  fluticasone (FLONASE) 50 MCG/ACT nasal spray Place 2 sprays into both nostrils daily. 08/31/20  Yes [place] J, DO  guaiFENesin-dextromethorphan (ROBITUSSIN DM) 100-10 MG/5ML syrup Take 10 mLs by mouth every 4 (four) hours as needed for cough. 08/30/20  Yes [place] Eric J,  DO  insulin glargine (LANTUS) 100 UNIT/ML Solostar Pen Inject 30 Units into the skin daily. 08/30/20 11/28/20 Yes [place] J, DO  insulin lispro (HUMALOG) 200 UNIT/ML KwikPen Inject 2-10 Units into the skin 3 (three) times daily before meals. 2 units for glucose 150-200, 4 units for glucose 201-250, 6 units for glucose 251-300, 8 units for glucose 301-350, 10 units for glucose 351-400. 08/30/20 11/28/20 Yes [place] Eric J, DO  metFORMIN (GLUCOPHAGE) 500 MG tablet Take 1 tablet (500 mg total) by mouth 2 (two) times daily with a meal. 08/30/20 11/28/20 Yes [place] J, DO  mometasone-formoterol (DULERA) 200-5 MCG/ACT AERO Inhale 2 puffs into the lungs 2 (two) times daily. 08/30/20 09/29/20 Yes [place] Donnamarie Poag, DO  Multiple Vitamin (MULTIVITAMIN) tablet Take 1 tablet by mouth daily.   Yes [provider]  predniSONE (DELTASONE) 10 MG tablet Take 10-30 mg by mouth daily with breakfast. Take 3 tablets (30 mg total) by mouth daily with breakfast for 3 days, THEN 2 tablets (20 mg total) daily with breakfast for 3 days, THEN 1 tablet (10 mg total) daily with breakfast for 3 days.   Yes [provider]  sildenafil (VIAGRA) 25 MG tablet Take 1 tablet (25 mg total) by mouth daily as needed for erectile dysfunction. 06/04/20  Yes Jearld Fenton, NP  zinc sulfate 220 (50 Zn) MG capsule Take 1 capsule (220 mg total) by mouth daily. 08/31/20 09/30/20 Yes [place] Donnamarie Poag, DO  Insulin Pen Needle (PEN NEEDLES 3/16") 31G X 5 MM MISC Use with insulin pen as directed 08/30/20   [place] Eric J, DO  predniSONE (DELTASONE) 10 MG tablet Take 3 tablets (30 mg total) by mouth daily with breakfast for 3 days, THEN 2 tablets (20 mg total) daily with breakfast for 3 days, THEN 1 tablet (10 mg total) daily with breakfast for 3 days. Patient not taking: Reported on 09/08/2020 08/31/20 09/09/20  [place] J, DO    Scheduled Meds:  Infusions: . sodium chloride    . pantoprazole (PROTONIX) IV     PRN  Meds:    Allergies as of 09/08/2020 - Review Complete 09/08/2020  Allergen Reaction Noted  . Bee venom Anaphylaxis 06/14/2014    Family History  Problem Relation Age of Onset  . Heart disease Mother   . Diabetes Mother   . Parkinson's disease Mother   . Heart disease Father   .  Diabetes Father   . Cancer Neg Hx   . Stroke Neg Hx     Social History   Socioeconomic History  . Marital status: Married    Spouse name: Not on file  . Number of children: Not on file  . Years of education: Not on file  . Highest education level: Not on file  Occupational History  . Not on file  Tobacco Use  . Smoking status: Never Smoker  . Smokeless tobacco: Current User    Types: Chew  Substance and Sexual Activity  . Alcohol use: Not Currently    Alcohol/week: 0.0 standard drinks  . Drug use: No  . Sexual activity: Yes  Other Topics Concern  . Not on file  Social History Narrative  . Not on file   Social Determinants of Health   Financial Resource Strain:   . Difficulty of Paying Living Expenses: Not on file  Food Insecurity:   . Worried About Charity fundraiser in the Last Year: Not on file  . Ran Out of Food in the Last Year: Not on file  Transportation Needs:   . Lack of Transportation (Medical): Not on file  . Lack of Transportation (Non-Medical): Not on file  Physical Activity:   . Days of Exercise per Week: Not on file  . Minutes of Exercise per Session: Not on file  Stress:   . Feeling of Stress : Not on file  Social Connections:   . Frequency of Communication with Friends and Family: Not on file  . Frequency of Social Gatherings with Friends and Family: Not on file  . Attends Religious Services: Not on file  . Active Member of Clubs or Organizations: Not on file  . Attends Archivist Meetings: Not on file  . Marital Status: Not on file  Intimate Partner Violence:   . Fear of Current or Ex-Partner: Not on file  . Emotionally Abused: Not on file  .  Physically Abused: Not on file  . Sexually Abused: Not on file    REVIEW OF SYSTEMS: Constitutional: Malaise, weakness. ENT:  No nose bleeds Pulm: Per HPI. CV:  No palpitations, no LE edema.  No angina GU:  No hematuria, no frequency GI: See HPI Heme: Denies unusual or excessive bleeding or bruising. Transfusions: None Neuro:  No headaches, no peripheral tingling or numbness.  No seizures, no syncope. Derm:  No itching, no rash or sores.  Endocrine:  No sweats or chills.  No polyuria or dysuria Immunization: Not queried.    PHYSICAL EXAM: Vital signs in last 24 hours: Vitals:   09/08/20 1115 09/08/20 1310  BP: 103/83 96/64  Pulse: (!) 122 97  Resp:  (!) 21  Temp:  98.3 F (36.8 C)  SpO2: 99%    Wt Readings from Last 3 Encounters:  09/08/20 95.3 kg  08/14/20 95.3 kg  06/04/20 (!) 95.3 kg    General: Slightly overweight, pale and mildly ill-appearing but alert and comfortable Head: No facial asymmetry or swelling.  No signs of head trauma. Eyes: Conjunctiva is pale Ears: Not hard of hearing Nose: No congestion, no dried blood. Mouth: Good dentition.  Tongue midline.  Mucosa moist, pink, clear. Neck: No JVD, no masses, no thyromegaly Lungs: Diminished breath sounds bilaterally.  No labored breathing on nasal cannula oxygen during speech.  No cough observed Heart: Regular.  Not currently tachycardic.  No MRG.  S1, S2 present Abdomen: Soft without tenderness.  No HSM, masses, bruits, hernias.  Bowel sounds  active..   Rectal: Not repeated.  DRE performed by PA revealed liquid black/gray stool which was Hemoccult positive.  No masses. Musc/Skeltl: No joint swelling, redness or gross deformity. Extremities: No CCE. Neurologic: Alert.  Oriented x3.  Moves all 4 limbs, strength not tested.  No tremor or gross deficits. Skin: No rash, no sores, no telangiectasia Nodes: No cervical adenopathy Psych: Cooperative, calm, pleasant.  Intake/Output from previous day: No  intake/output data recorded. Intake/Output this shift: No intake/output data recorded.  LAB RESULTS: Recent Labs    09/08/20 1019  WBC 5.9  HGB 9.5*  HCT 28.0*  PLT 240   BMET Lab Results  Component Value Date   NA 135 09/08/2020   NA 137 08/26/2020   NA 136 08/23/2020   K 3.6 09/08/2020   K 3.4 (L) 08/26/2020   K 3.7 08/23/2020   CL 101 09/08/2020   CL 102 08/26/2020   CL 101 08/23/2020   CO2 24 09/08/2020   CO2 26 08/26/2020   CO2 23 08/23/2020   GLUCOSE 226 (H) 09/08/2020   GLUCOSE 96 08/26/2020   GLUCOSE 106 (H) 08/23/2020   BUN 16 09/08/2020   BUN 13 08/26/2020   BUN 12 08/23/2020   CREATININE 0.68 09/08/2020   CREATININE 0.67 08/26/2020   CREATININE 0.68 08/23/2020   CALCIUM 8.5 (L) 09/08/2020   CALCIUM 8.6 (L) 08/26/2020   CALCIUM 8.5 (L) 08/23/2020   LFT Recent Labs    09/08/20 1019  PROT 5.7*  ALBUMIN 3.1*  AST 15  ALT 26  ALKPHOS 53  BILITOT 0.9   PT/INR No results found for: INR, PROTIME Hepatitis Panel No results for input(s): HEPBSAG, HCVAB, HEPAIGM, HEPBIGM in the last 72 hours. C-Diff No components found for: CDIFF Lipase  No results found for: LIPASE  Drugs of Abuse  No results found for: LABOPIA, COCAINSCRNUR, LABBENZ, AMPHETMU, THCU, LABBARB   RADIOLOGY STUDIES: DG Chest Port 1 View  Result Date: 09/08/2020 CLINICAL DATA:  Weakness, shortness of breath EXAM: PORTABLE CHEST 1 VIEW COMPARISON:  08/26/2020 FINDINGS: The heart size and mediastinal contours are within normal limits. Low lung volumes. Patchy airspace opacities bilaterally with a peripheral and basilar distribution, right worse than left. Overall findings have somewhat improved compared to the prior, particularly within the left lung. No pneumothorax. The visualized skeletal structures are unremarkable. IMPRESSION: Persistent multifocal pneumonia, right worse than left. Overall findings have slightly improved compared to the prior, particularly within the left lung.  Electronically Signed   By: Davina Poke D.O.   On: 09/08/2020 11:55      IMPRESSION:   *   Dark, FOBT + stools R/o PUD.  *    Acute anemia suspect due to GI blood loss.  1 PRBC ordered and transfusing.  Did have limited epistaxis 8 d ago.    *    COVID-19 pneumonia in unvaccinated patient.  Tested +10/1.  Admitted 10/1 -10/17.  2-week quarantine completed.  Discharged on prednisone taper.  Last dose of prednisone was due today but he has not taken it. Currently experiencing tachycardia.  Chest x-ray shows persistent right greater than left multifocal pneumonia with slight improvement compared with previous.  *   IDDM, type 2.  Insulin added during recent admission.     PLAN:     *     EGD tomorrow. Continue Protonix drip which has been started in the ED Complete the PRBC transfusion which is currently infusing.  CBC in AM.   Allow clear liquids but NPO  for several hours prior to endoscopy tmrw.   Azucena Freed  09/08/2020, 1:19 PM Phone [place] Attending   I have taken an interval history, reviewed the chart and examined the patient. I agree with the Advanced Practitioner's note, impression and recommendations.   Melena - upper GI bleed  Also having some epistaxis ? Contribution.  Plan for EGD tomorrow - (hopefully - schedule permitting)   Gatha Mayer, MD, [facility] 09/08/2020 5:05 PM

## 2020-09-08 NOTE — ED Notes (Signed)
Per Mickel Baas PA, orthostatic VS were attempted and when pt stood HR increased 140

## 2020-09-08 NOTE — Telephone Encounter (Signed)
Noted, will discuss at upcoming appt today

## 2020-09-09 ENCOUNTER — Inpatient Hospital Stay (HOSPITAL_COMMUNITY): Payer: 59

## 2020-09-09 ENCOUNTER — Encounter (HOSPITAL_COMMUNITY)
Admission: EM | Disposition: A | Discharge status: Home / Self Care | Payer: Self-pay | Source: Home / Self Care | Attending: Internal Medicine

## 2020-09-09 ENCOUNTER — Inpatient Hospital Stay (HOSPITAL_COMMUNITY): Payer: 59 | Admitting: Anesthesiology

## 2020-09-09 ENCOUNTER — Encounter (HOSPITAL_COMMUNITY): Payer: Self-pay | Admitting: Internal Medicine

## 2020-09-09 DIAGNOSIS — K2970 Gastritis, unspecified, without bleeding: Secondary | ICD-10-CM

## 2020-09-09 DIAGNOSIS — K254 Chronic or unspecified gastric ulcer with hemorrhage: Principal | ICD-10-CM

## 2020-09-09 DIAGNOSIS — K269 Duodenal ulcer, unspecified as acute or chronic, without hemorrhage or perforation: Secondary | ICD-10-CM

## 2020-09-09 DIAGNOSIS — K264 Chronic or unspecified duodenal ulcer with hemorrhage: Secondary | ICD-10-CM

## 2020-09-09 DIAGNOSIS — K922 Gastrointestinal hemorrhage, unspecified: Secondary | ICD-10-CM

## 2020-09-09 DIAGNOSIS — D649 Anemia, unspecified: Secondary | ICD-10-CM

## 2020-09-09 HISTORY — PX: ESOPHAGOGASTRODUODENOSCOPY (EGD) WITH PROPOFOL: SHX5813

## 2020-09-09 HISTORY — PX: BIOPSY: SHX5522

## 2020-09-09 LAB — CBC WITH DIFFERENTIAL/PLATELET
Abs Immature Granulocytes: 0.04 10*3/uL (ref 0.00–0.07)
Basophils Absolute: 0 10*3/uL (ref 0.0–0.1)
Basophils Relative: 1 %
Eosinophils Absolute: 0.4 10*3/uL (ref 0.0–0.5)
Eosinophils Relative: 8 %
HCT: 25 % — ABNORMAL LOW (ref 39.0–52.0)
Hemoglobin: 8.6 g/dL — ABNORMAL LOW (ref 13.0–17.0)
Immature Granulocytes: 1 %
Lymphocytes Relative: 40 %
Lymphs Abs: 1.8 10*3/uL (ref 0.7–4.0)
MCH: 30.7 pg (ref 26.0–34.0)
MCHC: 34.4 g/dL (ref 30.0–36.0)
MCV: 89.3 fL (ref 80.0–100.0)
Monocytes Absolute: 0.4 10*3/uL (ref 0.1–1.0)
Monocytes Relative: 10 %
Neutro Abs: 1.8 10*3/uL (ref 1.7–7.7)
Neutrophils Relative %: 40 %
Platelets: 177 10*3/uL (ref 150–400)
RBC: 2.8 MIL/uL — ABNORMAL LOW (ref 4.22–5.81)
RDW: 20.3 % — ABNORMAL HIGH (ref 11.5–15.5)
WBC: 4.3 10*3/uL (ref 4.0–10.5)
nRBC: 0 % (ref 0.0–0.2)

## 2020-09-09 LAB — GLUCOSE, CAPILLARY
Glucose-Capillary: 103 mg/dL — ABNORMAL HIGH (ref 70–99)
Glucose-Capillary: 113 mg/dL — ABNORMAL HIGH (ref 70–99)
Glucose-Capillary: 124 mg/dL — ABNORMAL HIGH (ref 70–99)
Glucose-Capillary: 152 mg/dL — ABNORMAL HIGH (ref 70–99)
Glucose-Capillary: 203 mg/dL — ABNORMAL HIGH (ref 70–99)
Glucose-Capillary: 80 mg/dL (ref 70–99)

## 2020-09-09 LAB — BASIC METABOLIC PANEL
Anion gap: 6 (ref 5–15)
Anion gap: 11 (ref 5–15)
BUN: 8 mg/dL (ref 6–20)
BUN: 6 mg/dL (ref 6–20)
CO2: 26 mmol/L (ref 22–32)
CO2: 21 mmol/L — ABNORMAL LOW (ref 22–32)
Calcium: 7.9 mg/dL — ABNORMAL LOW (ref 8.9–10.3)
Calcium: 8.1 mg/dL — ABNORMAL LOW (ref 8.9–10.3)
Chloride: 106 mmol/L (ref 98–111)
Chloride: 108 mmol/L (ref 98–111)
Creatinine, Ser: 0.64 mg/dL (ref 0.61–1.24)
Creatinine, Ser: 0.61 mg/dL (ref 0.61–1.24)
GFR, Estimated: >60 mL/min (ref >60)
GFR, Estimated: >60 mL/min (ref >60)
Glucose, Bld: 104 mg/dL — ABNORMAL HIGH (ref 70–99)
Glucose, Bld: 130 mg/dL — ABNORMAL HIGH (ref 70–99)
Potassium: 3.1 mmol/L — ABNORMAL LOW (ref 3.5–5.1)
Potassium: 3.8 mmol/L (ref 3.5–5.1)
Sodium: 138 mmol/L (ref 135–145)
Sodium: 140 mmol/L (ref 135–145)

## 2020-09-09 LAB — BPAM RBC
Blood Product Expiration Date: 202111272359
ISSUE DATE / TIME: 202110261328
Unit Type and Rh: 5100

## 2020-09-09 LAB — TYPE AND SCREEN
ABO/RH(D): O POS
Antibody Screen: NEGATIVE
Unit division: 0

## 2020-09-09 LAB — CBC
HCT: 27.4 % — ABNORMAL LOW (ref 39.0–52.0)
Hemoglobin: 9.2 g/dL — ABNORMAL LOW (ref 13.0–17.0)
MCH: 30.5 pg (ref 26.0–34.0)
MCHC: 33.6 g/dL (ref 30.0–36.0)
MCV: 90.7 fL (ref 80.0–100.0)
Platelets: 184 10*3/uL (ref 150–400)
RBC: 3.02 MIL/uL — ABNORMAL LOW (ref 4.22–5.81)
RDW: 20.3 % — ABNORMAL HIGH (ref 11.5–15.5)
WBC: 4.6 10*3/uL (ref 4.0–10.5)
nRBC: 0 % (ref 0.0–0.2)

## 2020-09-09 LAB — HEMOGLOBIN AND HEMATOCRIT, BLOOD
HCT: 25.9 % — ABNORMAL LOW (ref 39.0–52.0)
Hemoglobin: 8.7 g/dL — ABNORMAL LOW (ref 13.0–17.0)

## 2020-09-09 LAB — MAGNESIUM: Magnesium: 1.7 mg/dL (ref 1.7–2.4)

## 2020-09-09 SURGERY — ESOPHAGOGASTRODUODENOSCOPY (EGD) WITH PROPOFOL
Anesthesia: Monitor Anesthesia Care

## 2020-09-09 MED ORDER — POTASSIUM CHLORIDE 20 MEQ PO PACK
40.0000 meq | PACK | Freq: Once | ORAL | Status: DC
Start: 2020-09-09 — End: 2020-09-09

## 2020-09-09 MED ORDER — ALBUMIN HUMAN 5 % IV SOLN
12.5000 g | Freq: Once | INTRAVENOUS | Status: AC
  Administered 2020-09-09: 12.5 g via INTRAVENOUS

## 2020-09-09 MED ORDER — PHENYLEPHRINE HCL (PRESSORS) 10 MG/ML IV SOLN
INTRAVENOUS | Status: DC | PRN
  Administered 2020-09-09: 120 ug via INTRAVENOUS

## 2020-09-09 MED ORDER — POTASSIUM CHLORIDE 10 MEQ/100ML IV SOLN
10.0000 meq | INTRAVENOUS | Status: AC
  Administered 2020-09-09 (×3): 10 meq via INTRAVENOUS
  Filled 2020-09-09 (×3): qty 100

## 2020-09-09 MED ORDER — PHENYLEPHRINE HCL-NACL 10-0.9 MG/250ML-% IV SOLN
INTRAVENOUS | Status: DC | PRN
  Administered 2020-09-09: 25 ug/min via INTRAVENOUS

## 2020-09-09 MED ORDER — DEXMEDETOMIDINE HCL 200 MCG/2ML IV SOLN
INTRAVENOUS | Status: DC | PRN
  Administered 2020-09-09: 8 ug via INTRAVENOUS
  Administered 2020-09-09: 12 ug via INTRAVENOUS

## 2020-09-09 MED ORDER — PROPOFOL 10 MG/ML IV BOLUS
INTRAVENOUS | Status: DC | PRN
  Administered 2020-09-09 (×2): 30 ug via INTRAVENOUS

## 2020-09-09 MED ORDER — ALBUMIN HUMAN 5 % IV SOLN
INTRAVENOUS | Status: AC
  Filled 2020-09-09: qty 250

## 2020-09-09 MED ORDER — LIDOCAINE HCL (CARDIAC) PF 100 MG/5ML IV SOSY
PREFILLED_SYRINGE | INTRAVENOUS | Status: DC | PRN
  Administered 2020-09-09: 60 mg via INTRATRACHEAL

## 2020-09-09 MED ORDER — PROPOFOL 500 MG/50ML IV EMUL
INTRAVENOUS | Status: DC | PRN
  Administered 2020-09-09: 125 ug/kg/min via INTRAVENOUS

## 2020-09-09 MED ORDER — LACTATED RINGERS IV BOLUS
500.0000 mL | Freq: Once | INTRAVENOUS | Status: AC
  Administered 2020-09-09: 500 mL via INTRAVENOUS

## 2020-09-09 SURGICAL SUPPLY — 15 items

## 2020-09-09 NOTE — Progress Notes (Signed)
PROGRESS NOTE    AURON FORRISTER  ZOX:[id number] DOB: Mar 26, 1976 DOA: 09/08/2020 PCP: Lorre Munroe, NP    Brief Narrative: This 44 years old male with PMH of recent Covid pneumonia (dx 10/1 and discharged 10/17 on 4L O2) newly diagnosed diabetes, anxiety, hyperlipidemia who presented in the ER with 4-day history of dark stools at home as well as increased heart rate. His hemoglobin has dropped to 9.5 from 14 in 1 week.  Stool occult blood positive. Patient admitted for GI bleeding,  GI consulted.  Patient underwent EGD found to nonbleeding gastric and duodenal ulcers with clean ulcer base.  Advised PPI twice daily.  Monitor H&H.    Assessment & Plan:   Principal Problem:   GIB (gastrointestinal bleeding) Active Problems:   HLD (hyperlipidemia)   Diabetes mellitus (HCC)   Anxiety and depression   History of COVID-19   Symptomatic anemia   Duodenal ulcer hemorrhage  GIB (gastrointestinal bleeding) -Given recent COVID-19 pneumonia, ongoing use of steroids, suspect a stress ulcer at this time.  Hemoglobin    dropped approximately 5 g over the past 2 weeks.  He denies any bright red blood, mostly gray/melena.    Vitals are borderline with hypotension and tachycardia - S/P 1 prbc Hb 8.7 - monitor H and H and transfuse if Hb drops further. - s/p Protonix 80 mg bolus in ER. Started on protonix drip  - start IVF; watch for volume overload/respi decline given underlying recovering Covid pna -S/p EGD found to have gastric and duodenal ulcers with clean ulcer base. - Advised PPI BID and Carafate.  History of COVID-19 - diagnosed on 10/1; discharged on 10/17 on 4 L nasal cannula oxygen -Continue O2 and wean as able. -Continue inhalers. -Steroid course complete. -CXR shows some improvement from last, notably on left side.  Diabetes mellitus (HCC) - recent new diagnosis this past hospitalization for patient; he does complain of some subtle lower extremity neuropathy -Recent A1c 10.8%  on 08/15/2020 -Has been on Lantus 30 units daily and SSI,  -He is now done with steroids and currently n.p.o.; will reduce basal dose for now to 10 units and evaluate response and modify as needed -Continue q4h SSI while NPO; once on a diet again can change to ACHS  Anxiety and depression - States he takes Xanax at home but no recent fills since July 2021 -For now, will continue on Xanax in setting of avoiding further anxiety/stress until GI bleed further evaluated  HLD (hyperlipidemia) -Hold Lipitor for now but resume once GI bleed work-up complete   DVT prophylaxis: SCDS Code Status:  Full code. Family Communication:  No one at bed side. Disposition Plan:    Status is: Inpatient  Remains inpatient appropriate because:Inpatient level of care appropriate due to severity of illness   Dispo: The patient is from: Home              Anticipated d/c is to: Home              Anticipated d/c date is: 1 day              Patient currently is not medically stable to d/c.   Consultants:   Gastroenterology   Procedures:  EGD Antimicrobials:   Anti-infectives (From admission, onward)   None      Subjective: Patient was seen and examined at bedside.  He reports feeling better.  Denies any abdominal pain nausea vomiting.  He underwent EGD found to have gastric and duodenal ulcers  with clear ulcer base.  Objective: Vitals:   09/09/20 1438 09/09/20 1450 09/09/20 1500 09/09/20 1510  BP: 116/78 97/69 (!) 75/43 (!) 79/48  Pulse: 68 72 90 81  Resp: (!) 26 (!) 25 (!) 21 14  Temp:      TempSrc:      SpO2: 99% 98% 97% 100%  Weight:      Height:        Intake/Output Summary (Last 24 hours) at 09/09/2020 1535 Last data filed at 09/09/2020 1414 Gross per 24 hour  Intake 2097.46 ml  Output 2175 ml  Net -77.54 ml   Filed Weights   09/08/20 1014  Weight: 95.3 kg    Examination:  General exam: Appears calm and comfortable,  Respiratory system: Clear to auscultation.  Respiratory effort normal. Cardiovascular system: S1 & S2 heard, RRR. No JVD, murmurs, rubs, gallops or clicks. No pedal edema. Gastrointestinal system: Abdomen is nondistended, soft and nontender. No organomegaly or masses felt. Normal bowel sounds heard. Central nervous system: Alert and oriented. No focal neurological deficits. Extremities: No edema, no cyanosis, no clubbing Skin: No rashes, lesions or ulcers Psychiatry: Judgement and insight appear normal. Mood & affect appropriate.     Data Reviewed: I have personally reviewed following labs and imaging studies  CBC: Recent Labs  Lab 09/08/20 1019 09/09/20 0100 09/09/20 1122  WBC 5.9 4.3  --   NEUTROABS  --  1.8  --   HGB 9.5* 8.6* 8.7*  HCT 28.0* 25.0* 25.9*  MCV 94.0 89.3  --   PLT 240 177  --    Basic Metabolic Panel: Recent Labs  Lab 09/08/20 1019 09/09/20 0100  NA 135 138  K 3.6 3.1*  CL 101 106  CO2 24 26  GLUCOSE 226* 104*  BUN 16 8  CREATININE 0.68 0.64  CALCIUM 8.5* 7.9*  MG  --  1.7   GFR: Estimated Creatinine Clearance: 140.3 mL/min (by C-G formula based on SCr of 0.64 mg/dL). Liver Function Tests: Recent Labs  Lab 09/08/20 1019  AST 15  ALT 26  ALKPHOS 53  BILITOT 0.9  PROT 5.7*  ALBUMIN 3.1*   No results for input(s): LIPASE, AMYLASE in the last 168 hours. No results for input(s): AMMONIA in the last 168 hours. Coagulation Profile: No results for input(s): INR, PROTIME in the last 168 hours. Cardiac Enzymes: No results for input(s): CKTOTAL, CKMB, CKMBINDEX, TROPONINI in the last 168 hours. BNP (last 3 results) No results for input(s): PROBNP in the last 8760 hours. HbA1C: No results for input(s): HGBA1C in the last 72 hours. CBG: Recent Labs  Lab 09/08/20 2021 09/08/20 2302 09/09/20 0413 09/09/20 0744 09/09/20 1106  GLUCAP 181* 105* 103* 152* 113*   Lipid Profile: No results for input(s): CHOL, HDL, LDLCALC, TRIG, CHOLHDL, LDLDIRECT in the last 72 hours. Thyroid Function  Tests: No results for input(s): TSH, T4TOTAL, FREET4, T3FREE, THYROIDAB in the last 72 hours. Anemia Panel: No results for input(s): VITAMINB12, FOLATE, FERRITIN, TIBC, IRON, RETICCTPCT in the last 72 hours. Sepsis Labs: No results for input(s): PROCALCITON, LATICACIDVEN in the last 168 hours.  No results found for this or any previous visit (from the past 240 hour(s)).   Radiology Studies: DG Chest Port 1 View  Result Date: 09/08/2020 CLINICAL DATA:  Weakness, shortness of breath EXAM: PORTABLE CHEST 1 VIEW COMPARISON:  08/26/2020 FINDINGS: The heart size and mediastinal contours are within normal limits. Low lung volumes. Patchy airspace opacities bilaterally with a peripheral and basilar distribution, right worse than  left. Overall findings have somewhat improved compared to the prior, particularly within the left lung. No pneumothorax. The visualized skeletal structures are unremarkable. IMPRESSION: Persistent multifocal pneumonia, right worse than left. Overall findings have slightly improved compared to the prior, particularly within the left lung. Electronically Signed   By: Duanne Guess D.O.   On: 09/08/2020 11:55    Scheduled Meds: . [MAR Hold] fluticasone  2 spray Each Nare Daily  . [MAR Hold] insulin aspart  0-20 Units Subcutaneous Q4H  . [MAR Hold] mometasone-formoterol  2 puff Inhalation BID  . [MAR Hold] pantoprazole  40 mg Intravenous Q12H  . [MAR Hold] sodium chloride flush  3 mL Intravenous Q12H   Continuous Infusions: . [MAR Hold] sodium chloride    . albumin human    . lactated ringers 75 mL/hr at 09/09/20 1347  . pantoprozole (PROTONIX) infusion 8 mg/hr (09/09/20 1347)     LOS: 1 day    Time spent: 25 mins    Denny Mccree, MD [facility]   If 7PM-7AM, please contact night-coverage

## 2020-09-09 NOTE — Transfer of Care (Signed)
Immediate Anesthesia Transfer of Care Note  Patient: Joshua Bean  Procedure(s) Performed: ESOPHAGOGASTRODUODENOSCOPY (EGD) WITH PROPOFOL (N/A ) BIOPSY  Patient Location: PACU  Anesthesia Type:MAC  Level of Consciousness: drowsy and patient cooperative  Airway & Oxygen Therapy: Patient Spontanous Breathing and Patient connected to nasal cannula oxygen  Post-op Assessment: Report given to RN and Post -op Vital signs reviewed and stable  Post vital signs: Reviewed and stable  Last Vitals:  Vitals Value Taken Time  BP 72/41 09/09/20 1418  Temp    Pulse 73 09/09/20 1420  Resp 20 09/09/20 1420  SpO2 100 % 09/09/20 1420  Vitals shown include unvalidated device data.  Last Pain:  Vitals:   09/09/20 1250  TempSrc:   PainSc: 1       Patients Stated Pain Goal: 1 (17/49/44 9675)  Complications: No complications documented.

## 2020-09-09 NOTE — Op Note (Signed)
[facility] Patient Name: Joshua Bean Procedure Date : 09/09/2020 MRN: [id number] Attending MD: Mauri Pole , MD Date of Birth: 1976/10/11 CSN: [id number] Age: 44 Admit Type: Inpatient Procedure:                Upper GI endoscopy Indications:              Active gastrointestinal bleeding, Melena, Suspected                            upper gastrointestinal bleeding, Gastrointestinal                            bleeding of unknown origin Providers:                Mauri Pole, MD, Clyde Lundborg, RN,                            William Dalton, Technician Referring MD:              Medicines:                Monitored Anesthesia Care Complications:            No immediate complications. Estimated Blood Loss:     Estimated blood loss was minimal. Procedure:                Pre-Anesthesia Assessment:                           - Prior to the procedure, a History and Physical                            was performed, and patient medications and                            allergies were reviewed. The patient's tolerance of                            previous anesthesia was also reviewed. The risks                            and benefits of the procedure and the sedation                            options and risks were discussed with the patient.                            All questions were answered, and informed consent                            was obtained. Prior Anticoagulants: The patient has                            taken no previous anticoagulant or antiplatelet                            agents. ASA  Grade Assessment: III - A patient with                            severe systemic disease. After reviewing the risks                            and benefits, the patient was deemed in                            satisfactory condition to undergo the procedure.                           After obtaining informed consent, the endoscope was                             passed under direct vision. Throughout the                            procedure, the patient's blood pressure, pulse, and                            oxygen saturations were monitored continuously. The                            GIF-H190 ([id number] Olympus gastroscope was                            introduced through the mouth, and advanced to the                            second part of duodenum. The upper GI endoscopy was                            accomplished without difficulty. The patient                            tolerated the procedure well. Scope In: Scope Out: Findings:      The Z-line was regular and was found 40 cm from the incisors.      No gross lesions were noted in the entire esophagus.      Many non-bleeding cratered, linear and superficial gastric ulcers with a       clean ulcer base (Forrest Class III) were found in the entire examined       stomach. The largest lesion was 10 mm in largest dimension.      Scattered moderate inflammation characterized by congestion (edema),       erosions, erythema and friability was found in the entire examined       stomach. Biopsies were taken with a cold forceps for Helicobacter pylori       testing.      Three non-bleeding cratered duodenal ulcers with a clean ulcer base       (Forrest Class III) were found in the duodenal bulb and in the second       portion of the duodenum. The largest lesion was 5 mm in largest  dimension. Impression:               - Z-line regular, 40 cm from the incisors.                           - No gross lesions in esophagus.                           - Non-bleeding gastric ulcers with a clean ulcer                            base (Forrest Class III).                           - Gastritis. Biopsied.                           - Non-bleeding duodenal ulcers with a clean ulcer                            base (Forrest Class III). Moderate Sedation:      N/A Recommendation:           - Patient has a  contact number available for                            emergencies. The signs and symptoms of potential                            delayed complications were discussed with the                            patient. Return to normal activities tomorrow.                            Written discharge instructions were provided to the                            patient.                           - Resume previous diet.                           - Continue present medications.                           - Await pathology results.                           - Use Protonix (pantoprazole) 40 mg PO BID for 3                            months.                           - Use sucralfate tablets 1 gram PO QID for 1 month.                           -  Monitor Hgb q12h and transfuse as needed to                            maintain >7 Procedure Code(s):        --- Professional ---                           [phone], Esophagogastroduodenoscopy, flexible,                            transoral; with biopsy, single or multiple Diagnosis Code(s):        --- Professional ---                           K25.9, Gastric ulcer, unspecified as acute or                            chronic, without hemorrhage or perforation                           K29.70, Gastritis, unspecified, without bleeding                           K26.9, Duodenal ulcer, unspecified as acute or                            chronic, without hemorrhage or perforation                           K92.2, Gastrointestinal hemorrhage, unspecified CPT copyright 2019 American Medical Association. All rights reserved. The codes documented in this report are preliminary and upon coder review may  be revised to meet current compliance requirements. Mauri Pole, MD 09/09/2020 2:26:08 PM This report has been signed electronically. Number of Addenda: 0

## 2020-09-09 NOTE — Progress Notes (Signed)
Patient returned to 4E21 from endo procedure. VSS. Will continue to monitor closely.   Gailen Shelter RN

## 2020-09-09 NOTE — Progress Notes (Signed)
Patient became hypotensive intra procedure, was bolused with Phenylephrine and started on phenylepherine gtt. EGD showed multiple non bleeding gastric and duodenal ulcers  Patient c/o worsening cough, chest pain and SOB post procedure He is tachycardic and hypotensive with MAP in 50's Getting IV bolus  EKG showed sinus tachycardia Chest X-ray no significant change MAP improved with IV fluid bolus and IV albumin  Ordered stat CBC and BMP  Vital are stable to transfer to floor, discussed with Dr Dwyane Dee

## 2020-09-09 NOTE — TOC Benefit Eligibility Note (Signed)
Transition of Care [facility]) Benefit Eligibility Note    Patient Details  Name: Joshua Bean MRN: [id number] Date of Birth: [birth year]   Medication/Dose: Wilder Glade 10mg . daily and or Jardiance 10mg . daily for 30 day supply or( 90 days supply thru [facility])  Covered?: Yes  Tier: 2 Drug  Prescription Coverage Preferred Pharmacy: [place] with Person/Company/Phone Number:: Darlene A. W/[employer]BW#[phone]  Co-Pay: Zero  Prior Approval: No  Deductible:  (No Deductible on this plan.)       Shelda Altes Phone Number: 09/09/2020, 11:22 AM

## 2020-09-09 NOTE — Progress Notes (Signed)
Patient SOB, tachycardic, and hypotensive post EGD procedure. Made Doroteo Glassman DO and Silverio Decamp, MD aware. Administered albumin and 2.5L IV fluids. Obtained chest X-ray and EKG. Ordered CBC and BMP. Patient's BP improved and returned patient to 4E. Made RN aware of vital signs, pain level, and interventions. Jacqualin Combes, RN that blood needs to be drawn for CBC and BMP.

## 2020-09-09 NOTE — Anesthesia Procedure Notes (Signed)
Procedure Name: MAC Date/Time: 09/09/2020 1:52 PM Performed by: Kathryne Hitch, CRNA Pre-anesthesia Checklist: Patient identified, Emergency Drugs available, Suction available and Patient being monitored Patient Re-evaluated:Patient Re-evaluated prior to induction Oxygen Delivery Method: Nasal cannula Preoxygenation: Pre-oxygenation with 100% oxygen Induction Type: IV induction Dental Injury: Teeth and Oropharynx as per pre-operative assessment

## 2020-09-09 NOTE — Progress Notes (Signed)
Patient transfer to procedural area

## 2020-09-09 NOTE — Interval H&P Note (Signed)
History and Physical Interval Note:  09/09/2020 12:37 PM  Joshua Bean  has presented today for surgery, with the diagnosis of melena, anemia.  The various methods of treatment have been discussed with the patient and family. After consideration of risks, benefits and other options for treatment, the patient has consented to  Procedure(s): ESOPHAGOGASTRODUODENOSCOPY (EGD) WITH PROPOFOL (N/A) as a surgical intervention.  The patient's history has been reviewed, patient examined, no change in status, stable for surgery.  I have reviewed the patient's chart and labs.  Questions were answered to the patient's satisfaction.     Yamilet Mcfayden

## 2020-09-09 NOTE — Care Management (Signed)
Bene check sent for cost of farxiga and jardiance 10mg /day.

## 2020-09-09 NOTE — Anesthesia Preprocedure Evaluation (Addendum)
Anesthesia Evaluation  Patient identified by MRN, date of birth, ID band Patient awake    Reviewed: Allergy & Precautions, NPO status , Patient's Chart, lab work & pertinent test results  History of Anesthesia Complications Negative for: history of anesthetic complications  Airway Mallampati: II  TM Distance: >3 FB Neck ROM: Full    Dental  (+) Dental Advisory Given   Pulmonary asthma , pneumonia (improving),    Pulmonary exam normal        Cardiovascular negative cardio ROS Normal cardiovascular exam     Neuro/Psych PSYCHIATRIC DISORDERS Anxiety Depression negative neurological ROS     GI/Hepatic negative GI ROS, Neg liver ROS,   Endo/Other  diabetes, Type 2, Insulin Dependent, Oral Hypoglycemic Agents Hypokalemia, K 3.1   Renal/GU negative Renal ROS     Musculoskeletal  (+) Arthritis ,   Abdominal   Peds  Hematology  (+) anemia ,   Anesthesia Other Findings Covid+ 08/14/20, admitted 10/1-10/17 for PNA, not intubated, still with some SOB and productive cough, on 4L [place] O2 at home     Reproductive/Obstetrics                            Anesthesia Physical Anesthesia Plan  ASA: III  Anesthesia Plan: MAC   Post-op Pain Management:    Induction: Intravenous  PONV Risk Score and Plan: 1 and Propofol infusion and Treatment may vary due to age or medical condition  Airway Management Planned: Nasal Cannula and Natural Airway  Additional Equipment: None  Intra-op Plan:   Post-operative Plan:   Informed Consent: I have reviewed the patients History and Physical, chart, labs and discussed the procedure including the risks, benefits and alternatives for the proposed anesthesia with the patient or authorized representative who has indicated his/her understanding and acceptance.       Plan Discussed with: CRNA and Anesthesiologist  Anesthesia Plan Comments:        Anesthesia  Quick Evaluation

## 2020-09-10 ENCOUNTER — Other Ambulatory Visit: Payer: Self-pay | Admitting: Physician Assistant

## 2020-09-10 DIAGNOSIS — D62 Acute posthemorrhagic anemia: Secondary | ICD-10-CM | POA: Diagnosis not present

## 2020-09-10 DIAGNOSIS — K259 Gastric ulcer, unspecified as acute or chronic, without hemorrhage or perforation: Secondary | ICD-10-CM | POA: Diagnosis not present

## 2020-09-10 LAB — CBC WITH DIFFERENTIAL/PLATELET
Abs Immature Granulocytes: 0.05 10*3/uL (ref 0.00–0.07)
Basophils Absolute: 0 10*3/uL (ref 0.0–0.1)
Basophils Relative: 1 %
Eosinophils Absolute: 0.3 10*3/uL (ref 0.0–0.5)
Eosinophils Relative: 7 %
HCT: 24.7 % — ABNORMAL LOW (ref 39.0–52.0)
Hemoglobin: 8.4 g/dL — ABNORMAL LOW (ref 13.0–17.0)
Immature Granulocytes: 1 %
Lymphocytes Relative: 32 %
Lymphs Abs: 1.4 10*3/uL (ref 0.7–4.0)
MCH: 30.7 pg (ref 26.0–34.0)
MCHC: 34 g/dL (ref 30.0–36.0)
MCV: 90.1 fL (ref 80.0–100.0)
Monocytes Absolute: 0.4 10*3/uL (ref 0.1–1.0)
Monocytes Relative: 10 %
Neutro Abs: 2.1 10*3/uL (ref 1.7–7.7)
Neutrophils Relative %: 49 %
Platelets: 181 10*3/uL (ref 150–400)
RBC: 2.74 MIL/uL — ABNORMAL LOW (ref 4.22–5.81)
RDW: 20.3 % — ABNORMAL HIGH (ref 11.5–15.5)
WBC: 4.2 10*3/uL (ref 4.0–10.5)
nRBC: 0 % (ref 0.0–0.2)

## 2020-09-10 LAB — COMPREHENSIVE METABOLIC PANEL
ALT: 35 U/L (ref 0–44)
AST: 23 U/L (ref 15–41)
Albumin: 2.5 g/dL — ABNORMAL LOW (ref 3.5–5.0)
Alkaline Phosphatase: 43 U/L (ref 38–126)
Anion gap: 8 (ref 5–15)
BUN: <5 mg/dL — ABNORMAL LOW (ref 6–20)
CO2: 22 mmol/L (ref 22–32)
Calcium: 8.4 mg/dL — ABNORMAL LOW (ref 8.9–10.3)
Chloride: 108 mmol/L (ref 98–111)
Creatinine, Ser: 0.61 mg/dL (ref 0.61–1.24)
GFR, Estimated: >60 mL/min (ref >60)
Glucose, Bld: 140 mg/dL — ABNORMAL HIGH (ref 70–99)
Potassium: 3.3 mmol/L — ABNORMAL LOW (ref 3.5–5.1)
Sodium: 138 mmol/L (ref 135–145)
Total Bilirubin: 0.8 mg/dL (ref 0.3–1.2)
Total Protein: 4.5 g/dL — ABNORMAL LOW (ref 6.5–8.1)

## 2020-09-10 LAB — GLUCOSE, CAPILLARY
Glucose-Capillary: 95 mg/dL (ref 70–99)
Glucose-Capillary: 128 mg/dL — ABNORMAL HIGH (ref 70–99)
Glucose-Capillary: 117 mg/dL — ABNORMAL HIGH (ref 70–99)
Glucose-Capillary: 177 mg/dL — ABNORMAL HIGH (ref 70–99)
Glucose-Capillary: 144 mg/dL — ABNORMAL HIGH (ref 70–99)
Glucose-Capillary: 237 mg/dL — ABNORMAL HIGH (ref 70–99)

## 2020-09-10 LAB — PHOSPHORUS: Phosphorus: 3.4 mg/dL (ref 2.5–4.6)

## 2020-09-10 LAB — SURGICAL PATHOLOGY

## 2020-09-10 LAB — MAGNESIUM: Magnesium: 1.6 mg/dL — ABNORMAL LOW (ref 1.7–2.4)

## 2020-09-10 LAB — HEMOGLOBIN AND HEMATOCRIT, BLOOD
HCT: 26.9 % — ABNORMAL LOW (ref 39.0–52.0)
Hemoglobin: 9 g/dL — ABNORMAL LOW (ref 13.0–17.0)

## 2020-09-10 MED ORDER — ATORVASTATIN CALCIUM 40 MG PO TABS
40.0000 mg | ORAL_TABLET | Freq: Every day | ORAL | Status: DC
  Administered 2020-09-11: 40 mg via ORAL
  Filled 2020-09-10: qty 1

## 2020-09-10 MED ORDER — LACTATED RINGERS IV BOLUS
500.0000 mL | Freq: Once | INTRAVENOUS | Status: AC
  Administered 2020-09-10: 500 mL via INTRAVENOUS

## 2020-09-10 MED ORDER — MENTHOL 3 MG MT LOZG
1.0000 | LOZENGE | OROMUCOSAL | Status: DC | PRN
  Filled 2020-09-10: qty 9

## 2020-09-10 MED ORDER — PANTOPRAZOLE SODIUM 40 MG PO TBEC
40.0000 mg | DELAYED_RELEASE_TABLET | Freq: Two times a day (BID) | ORAL | Status: DC
  Administered 2020-09-10 – 2020-09-11 (×2): 40 mg via ORAL
  Filled 2020-09-10 (×2): qty 1

## 2020-09-10 MED ORDER — MAGNESIUM SULFATE 2 GM/50ML IV SOLN
2.0000 g | Freq: Once | INTRAVENOUS | Status: AC
  Administered 2020-09-10: 2 g via INTRAVENOUS
  Filled 2020-09-10: qty 50

## 2020-09-10 MED ORDER — INSULIN ASPART 100 UNIT/ML ~~LOC~~ SOLN
0.0000 [IU] | Freq: Three times a day (TID) | SUBCUTANEOUS | Status: DC
  Administered 2020-09-11: 5 [IU] via SUBCUTANEOUS
  Administered 2020-09-11: 3 [IU] via SUBCUTANEOUS

## 2020-09-10 MED ORDER — POTASSIUM CHLORIDE CRYS ER 20 MEQ PO TBCR
40.0000 meq | EXTENDED_RELEASE_TABLET | ORAL | Status: AC
  Administered 2020-09-10 (×2): 40 meq via ORAL
  Filled 2020-09-10 (×2): qty 2

## 2020-09-10 MED ORDER — INSULIN ASPART 100 UNIT/ML ~~LOC~~ SOLN
0.0000 [IU] | Freq: Every day | SUBCUTANEOUS | Status: DC
  Administered 2020-09-10: 2 [IU] via SUBCUTANEOUS

## 2020-09-10 NOTE — Progress Notes (Signed)
PROGRESS NOTE    Joshua Bean  ION:[id number]  DOB: 03-May-1976  DOA: 09/08/2020 PCP: Lorre Munroe, NP Outpatient Specialists:   Hospital course:  44 year old man with recent diagnosis of Covid pneumonia, discharged on 08/30/2020 on 4 L of O2 was admitted 09/08/2020 with melenic stool.  Hemoglobin had gone from 14 to 9.5 in 1 week.  Patient was started on PPI and patient underwent EGD which found nonbleeding gastric and duodenal ulcers.   Subjective:  Patient states he is doing okay.  He has not had a bowel movement since admission.  His main concern is why his heart rate keeps on going up to 130 with minimal exertion.  He has some shortness of breath with this.  Patient admits that he has been short of breath since his Covid pneumonia diagnosis.  Denies dizziness but admits to generalized weakness.   Objective: Vitals:   09/10/20 0409 09/10/20 0819 09/10/20 0835 09/10/20 1108  BP: (!) 90/56 (!) 84/56  102/61  Pulse: 94 84 88 (!) 112  Resp: (!) 22 17 16  (!) 22  Temp: 98.9 F (37.2 C) 98.9 F (37.2 C)  99.6 F (37.6 C)  TempSrc: Oral Oral  Oral  SpO2: 98% 100% 100% 99%  Weight:      Height:        Intake/Output Summary (Last 24 hours) at 09/10/2020 1647 Last data filed at 09/10/2020 0900 Gross per 24 hour  Intake 1919.28 ml  Output 1425 ml  Net 494.28 ml   Filed Weights   09/08/20 1014  Weight: 95.3 kg     Exam:  General: Generally well-appearing man in no apparent distress. Eyes: sclera anicteric, conjuctiva mild injection bilaterally CVS: Tachycardic, S1-S2, regular  Respiratory: Rales at bases bilaterally with reasonable air entry. GI: NABS, soft, NT  LE: No edema.  Neuro: A/O x 3, Moving all extremities equally with normal strength, CN 3-12 intact, grossly nonfocal.  Psych: patient is logical and coherent, judgement and insight appear normal, mood and affect appropriate to situation.   Assessment & Plan:   44 year old man with recent diagnosis  of Covid was admitted with melena.  H&H are stable however patient has persistent tachycardia and weakness.  Melena EGD with duodenal and gastric ulcers. Await H. pylori status. Continue Protonix 40 twice daily  Persistent tachycardia Very likely secondary to intravascular volume depletion BP has improved since he has been getting conservative IV fluids LR at 75 cc an hour We will give LR 500 cc bolus x1 Continue LR at 75 cc an hour, will need to be conservative due to to persistent infiltrates from Covid pneumonia and ongoing need for oxygen.    History of COVID-19 pneumonia Patient was discharged on 08/30/2020 on 4 L of nasal cannula O2. He is still requiring 3 L [place] O2 As noted above, will need to be careful with hydration given persistent infiltrates on chest x-ray. Has completed a course of steroids and remdesivir Continue inhaled bronchodilators as needed.  Hypomagnesemia We will replete and recheck in the morning  Hypokalemia We will replete and recheck in the morning.  DM2. Blood sugars under reasonable control on SSI every 4 hours  Anxiety and depression Continue Xanax however patient will need to get it prescribed as an outpatient by his PCP.  Dyslipidemia Restart Lipitor    DVT prophylaxis: SCD Code Status: Full Family Communication: No need to call anybody per patient Disposition Plan:   Patient is from: Home  Anticipated Discharge Location: Home  Barriers  to Discharge: Persistent tachycardia and shortness of breath  Is patient medically stable for Discharge: Not yet hopefully tomorrow   Consultants:  GI  Procedures:  EGD  Antimicrobials:  None   Data Reviewed:  Basic Metabolic Panel: Recent Labs  Lab 09/08/20 1019 09/09/20 0100 09/09/20 1750 09/10/20 0126  NA 135 138 140 138  K 3.6 3.1* 3.8 3.3*  CL 101 106 108 108  CO2 24 26 21* 22  GLUCOSE 226* 104* 130* 140*  BUN 16 8 6  <5*  CREATININE 0.68 0.64 0.61 0.61  CALCIUM 8.5* 7.9*  8.1* 8.4*  MG  --  1.7  --  1.6*  PHOS  --   --   --  3.4   Liver Function Tests: Recent Labs  Lab 09/08/20 1019 09/10/20 0126  AST 15 23  ALT 26 35  ALKPHOS 53 43  BILITOT 0.9 0.8  PROT 5.7* 4.5*  ALBUMIN 3.1* 2.5*   No results for input(s): LIPASE, AMYLASE in the last 168 hours. No results for input(s): AMMONIA in the last 168 hours. CBC: Recent Labs  Lab 09/08/20 1019 09/09/20 0100 09/09/20 1122 09/09/20 1750 09/10/20 0126  WBC 5.9 4.3  --  4.6 4.2  NEUTROABS  --  1.8  --   --  2.1  HGB 9.5* 8.6* 8.7* 9.2* 8.4*  HCT 28.0* 25.0* 25.9* 27.4* 24.7*  MCV 94.0 89.3  --  90.7 90.1  PLT 240 177  --  184 181   Cardiac Enzymes: No results for input(s): CKTOTAL, CKMB, CKMBINDEX, TROPONINI in the last 168 hours. BNP (last 3 results) No results for input(s): PROBNP in the last 8760 hours. CBG: Recent Labs  Lab 09/09/20 1942 09/09/20 2336 09/10/20 0412 09/10/20 0821 09/10/20 1108  GLUCAP 203* 80 128* 117* 177*    No results found for this or any previous visit (from the past 240 hour(s)).    Studies: DG Chest Port 1 View  Result Date: 09/09/2020 CLINICAL DATA:  44 year old male cough. Positive COVID-19. Status post EGD. EXAM: PORTABLE CHEST 1 VIEW COMPARISON:  Chest radiograph dated 09/08/2020. FINDINGS: Bilateral peripheral and subpleural hazy densities similar to the prior radiograph. No pleural effusion or pneumothorax. Stable cardiomediastinal silhouette. No acute osseous pathology. IMPRESSION: Multifocal pneumonia. No significant interval change since 09/08/2020. Electronically Signed   By: Elgie Collard M.D.   On: 09/09/2020 16:14     Scheduled Meds: . fluticasone  2 spray Each Nare Daily  . insulin aspart  0-20 Units Subcutaneous Q4H  . mometasone-formoterol  2 puff Inhalation BID  . pantoprazole  40 mg Oral BID  . sodium chloride flush  3 mL Intravenous Q12H   Continuous Infusions: . sodium chloride    . lactated ringers 75 mL/hr at 09/10/20 0202      Principal Problem:   GIB (gastrointestinal bleeding) Active Problems:   HLD (hyperlipidemia)   Diabetes mellitus (HCC)   Anxiety and depression   History of COVID-19   Symptomatic anemia   Duodenal ulcer hemorrhage     Jaxx Huish Orma Flaming, [facility]  If 7PM-7AM, please contact night-coverage [email or web address] Password [email or web address] 09/10/2020, 4:47 PM    LOS: 2 days

## 2020-09-10 NOTE — Progress Notes (Addendum)
Daily Rounding Note  09/10/2020, 11:14 AM  LOS: 2 days   SUBJECTIVE:   Chief complaint: Gastric and duodenal ulcers, GI bleed with melena.  Anemia  Patient has not had a bowel movement since he got to the hospital a couple of days ago.  Has been on clear liquids but starts solid food for lunch today.  No abdominal pain. Heart rate is running into the 150s and he feels significantly dyspneic when he gets up to do minor activity. No GI complaints.  OBJECTIVE:         Vital signs in last 24 hours:    Temp:  [97.9 F (36.6 C)-99.6 F (37.6 C)] 99.6 F (37.6 C) (10/28 1108) Pulse Rate:  [66-132] 112 (10/28 1108) Resp:  [14-31] 22 (10/28 1108) BP: (55-116)/(37-78) 102/61 (10/28 1108) SpO2:  [90 %-100 %] 99 % (10/28 1108) FiO2 (%):  [32 %] 32 % (10/28 0835) Last BM Date: 09/08/20 Filed Weights   09/08/20 1014  Weight: 95.3 kg   General: Looks better than he did a couple of days ago, less pale.  Comfortable, alert Heart: RRR rate in low 100s. Chest: Diminished breath sounds globally.  No cough.  No dyspnea with speaking or at rest. Abdomen: Soft without tenderness.  No distention.  Active bowel sounds Extremities: No CCE. Neuro/Psych: Pleasant, cooperative, calm, fluid speech.  Fully alert and oriented.  Appropriate.  Intake/Output from previous day: 10/27 0701 - 10/28 0700 In: 2379.3 [P.O.:240; I.V.:1947.4; IV Piggyback:191.9] Out: 2225 [Urine:2225]  Lab Results: Recent Labs    09/09/20 0100 09/09/20 0100 09/09/20 1122 09/09/20 1750 09/10/20 0126  WBC 4.3  --   --  4.6 4.2  HGB 8.6*   < > 8.7* 9.2* 8.4*  HCT 25.0*   < > 25.9* 27.4* 24.7*  PLT 177  --   --  184 181   < > = values in this interval not displayed.   BMET Recent Labs    09/09/20 0100 09/09/20 1750 09/10/20 0126  NA 138 140 138  K 3.1* 3.8 3.3*  CL 106 108 108  CO2 26 21* 22  GLUCOSE 104* 130* 140*  BUN 8 6 <5*  CREATININE 0.64 0.61  0.61  CALCIUM 7.9* 8.1* 8.4*   LFT Recent Labs    09/08/20 1019 09/10/20 0126  PROT 5.7* 4.5*  ALBUMIN 3.1* 2.5*  AST 15 23  ALT 26 35  ALKPHOS 53 43  BILITOT 0.9 0.8    Studies/Results: DG Chest Port 1 View  Result Date: 09/09/2020 CLINICAL DATA:  44 year old male cough. Positive COVID-19. Status post EGD. EXAM: PORTABLE CHEST 1 VIEW COMPARISON:  Chest radiograph dated 09/08/2020. FINDINGS: Bilateral peripheral and subpleural hazy densities similar to the prior radiograph. No pleural effusion or pneumothorax. Stable cardiomediastinal silhouette. No acute osseous pathology. IMPRESSION: Multifocal pneumonia. No significant interval change since 09/08/2020. Electronically Signed   By: Anner Crete M.D.   On: 09/09/2020 16:14   DG Chest Port 1 View  Result Date: 09/08/2020 CLINICAL DATA:  Weakness, shortness of breath EXAM: PORTABLE CHEST 1 VIEW COMPARISON:  08/26/2020 FINDINGS: The heart size and mediastinal contours are within normal limits. Low lung volumes. Patchy airspace opacities bilaterally with a peripheral and basilar distribution, right worse than left. Overall findings have somewhat improved compared to the prior, particularly within the left lung. No pneumothorax. The visualized skeletal structures are unremarkable. IMPRESSION: Persistent multifocal pneumonia, right worse than left. Overall findings have slightly improved compared to  the prior, particularly within the left lung. Electronically Signed   By: Davina Poke D.O.   On: 09/08/2020 11:55    Scheduled Meds: . fluticasone  2 spray Each Nare Daily  . insulin aspart  0-20 Units Subcutaneous Q4H  . mometasone-formoterol  2 puff Inhalation BID  . [START ON 09/12/2020] pantoprazole  40 mg Intravenous Q12H  . sodium chloride flush  3 mL Intravenous Q12H   Continuous Infusions: . sodium chloride    . lactated ringers 75 mL/hr at 09/10/20 0202  . pantoprozole (PROTONIX) infusion 8 mg/hr (09/10/20 0158)   PRN  Meds:.acetaminophen **OR** acetaminophen, albuterol, ALPRAZolam, menthol-cetylpyridinium   ASSESMENT:   *   Upper GI bleed, melena. 09/09/2020 EGD with multiple gastric ulcers, these were cratered, nonbleeding, clean-based.  Largest ulcer 10 mm.  Scattered friable gastritis.  3, nonbleeding, created duodenal ulcers with clean bases, largest 5 mm.  Biopsies obtained from stomach are pending.  *   [place] anemia.   Got 1 PRBC.    *    COVID-19 pneumonia admitted 10/1 through 10/17.  Continues to have significant respiratory symptoms.  *   Tachycardia.     PLAN   *   Await path report, treat if H Pylori is positive.    *   Continue Protonix 40 mg po bid for 3 months, sucralfate tablets 1 po QID for 1 month. Will arrange follow-up with GI in the next several weeks.  Should have CBC obtained within a couple of weeks of discharge, this can be performed at his PMD office Cecille Po NP closer to his home in [place]  09/10/2020, 11:14 AM Phone [phone]  I have also personally evaluated the patient with history and physical.  He has tachycardia and one would think he is probably still volume depleted.  He is positive about 1-1/2 L since admission but perhaps he is dry.  As Dr. Jamse Arn pointed out in her note it is complicated given Covid and need for oxygen.  He may not actually need blood.  I am checking a hemoglobin tonight and we will see what tomorrow morning shows as well.  Dr. Jamse Arn is ordered a bolus of 500 cc which I think makes sense.  We will follow up tomorrow and arrange outpatient follow-up as above.  One good option is for him to come to my clinic early next week just to get a CBC and I can call him with results and plans for follow-up.  Gatha Mayer, MD, [facility] 09/10/2020 5:42 PM

## 2020-09-10 NOTE — Anesthesia Postprocedure Evaluation (Signed)
Anesthesia Post Note  Patient: OPAL MCKELLIPS  Procedure(s) Performed: ESOPHAGOGASTRODUODENOSCOPY (EGD) WITH PROPOFOL (N/A ) BIOPSY     Patient location during evaluation: PACU Anesthesia Type: MAC Level of consciousness: awake and alert (Initially anxious, improved over time) Pain management: pain level controlled Vital Signs Assessment: post-procedure vital signs reviewed and stable Respiratory status: spontaneous breathing, nonlabored ventilation, respiratory function stable and patient connected to nasal cannula oxygen Cardiovascular status: stable Anesthetic complications: no Comments: Patient with some anxiety initially in PACU. Also tachycardic with soft BP, which was treated with IVF and albumin. C/O SOB and chest pain. CXR without significant change, EKG unchanged. Improved over time, moved back to room.   No complications documented.  Last Vitals:  Vitals:   09/10/20 0405 09/10/20 0409  BP: (!) 82/47 (!) 90/56  Pulse: 91 94  Resp: (!) 24 (!) 22  Temp:  37.2 C  SpO2: 97% 98%    Last Pain:  Vitals:   09/10/20 0409  TempSrc: Oral  PainSc:                  Audry Pili

## 2020-09-11 ENCOUNTER — Other Ambulatory Visit: Payer: Self-pay | Admitting: Internal Medicine

## 2020-09-11 DIAGNOSIS — D62 Acute posthemorrhagic anemia: Secondary | ICD-10-CM

## 2020-09-11 LAB — CBC WITH DIFFERENTIAL/PLATELET
Abs Immature Granulocytes: 0.04 10*3/uL (ref 0.00–0.07)
Basophils Absolute: 0 10*3/uL (ref 0.0–0.1)
Basophils Relative: 0 %
Eosinophils Absolute: 0.5 10*3/uL (ref 0.0–0.5)
Eosinophils Relative: 13 %
HCT: 24.8 % — ABNORMAL LOW (ref 39.0–52.0)
Hemoglobin: 8.2 g/dL — ABNORMAL LOW (ref 13.0–17.0)
Immature Granulocytes: 1 %
Lymphocytes Relative: 44 %
Lymphs Abs: 1.7 10*3/uL (ref 0.7–4.0)
MCH: 30.4 pg (ref 26.0–34.0)
MCHC: 33.1 g/dL (ref 30.0–36.0)
MCV: 91.9 fL (ref 80.0–100.0)
Monocytes Absolute: 0.4 10*3/uL (ref 0.1–1.0)
Monocytes Relative: 10 %
Neutro Abs: 1.2 10*3/uL — ABNORMAL LOW (ref 1.7–7.7)
Neutrophils Relative %: 32 %
Platelets: 175 10*3/uL (ref 150–400)
RBC: 2.7 MIL/uL — ABNORMAL LOW (ref 4.22–5.81)
RDW: 20.3 % — ABNORMAL HIGH (ref 11.5–15.5)
WBC: 3.7 10*3/uL — ABNORMAL LOW (ref 4.0–10.5)
nRBC: 0 % (ref 0.0–0.2)

## 2020-09-11 LAB — BASIC METABOLIC PANEL
Anion gap: 9 (ref 5–15)
BUN: 5 mg/dL — ABNORMAL LOW (ref 6–20)
CO2: 23 mmol/L (ref 22–32)
Calcium: 8 mg/dL — ABNORMAL LOW (ref 8.9–10.3)
Chloride: 106 mmol/L (ref 98–111)
Creatinine, Ser: 0.67 mg/dL (ref 0.61–1.24)
GFR, Estimated: >60 mL/min (ref >60)
Glucose, Bld: 227 mg/dL — ABNORMAL HIGH (ref 70–99)
Potassium: 3.7 mmol/L (ref 3.5–5.1)
Sodium: 138 mmol/L (ref 135–145)

## 2020-09-11 LAB — GLUCOSE, CAPILLARY
Glucose-Capillary: 178 mg/dL — ABNORMAL HIGH (ref 70–99)
Glucose-Capillary: 215 mg/dL — ABNORMAL HIGH (ref 70–99)

## 2020-09-11 LAB — MAGNESIUM: Magnesium: 1.8 mg/dL (ref 1.7–2.4)

## 2020-09-11 MED ORDER — PANTOPRAZOLE SODIUM 40 MG PO TBEC: 40.0000 mg | DELAYED_RELEASE_TABLET | Freq: Two times a day (BID) | ORAL | 0 refills | Status: DC

## 2020-09-11 MED ORDER — SODIUM CHLORIDE 0.9 % IV BOLUS
1000.0000 mL | Freq: Once | INTRAVENOUS | Status: AC
  Administered 2020-09-11: 1000 mL via INTRAVENOUS

## 2020-09-11 MED ORDER — DOCUSATE SODIUM 100 MG PO CAPS: 100.0000 mg | ORAL_CAPSULE | Freq: Once | ORAL | Status: DC

## 2020-09-11 NOTE — Progress Notes (Addendum)
Daily Rounding Note  09/11/2020, 10:53 AM  LOS: 3 days   SUBJECTIVE:   Chief complaint: Dark stools, anemia.    Has not had a bowel movement yet since his admission 3 days ago. Tolerating soft diet. Breathing and weakness much improved.  Tachycardia, accelerated with activity, persists.  OBJECTIVE:         Vital signs in last 24 hours:    Temp:  [97.7 F (36.5 C)-99.8 F (37.7 C)] 97.7 F (36.5 C) (10/29 0731) Pulse Rate:  [97-112] 100 (10/29 0731) Resp:  [20-23] 23 (10/29 0731) BP: (98-109)/(60-73) 109/72 (10/29 0731) SpO2:  [95 %-100 %] 100 % (10/29 0731) Last BM Date: 09/08/20 Filed Weights   09/08/20 1014  Weight: 95.3 kg   General: Looks well.  Alert.  Comfortable.  A bit pale. Heart: Regular, tacky at 108. Chest: Globally diminished breath sounds in the bases about 1/2-2/3 of the way up.  No cough.  No dyspnea while speaking. Abdomen: Soft without tenderness.  Active bowel sounds.  Not distended Extremities: No CCE. Neuro/Psych: Oriented x3.  Alert.  Good historian.  No weakness or tremors.  Intake/Output from previous day: 10/28 0701 - 10/29 0700 In: 1557.1 [P.O.:960; I.V.:597.1] Out: 1500 [Urine:1500]  Intake/Output this shift: Total I/O In: -  Out: 200 [Urine:200]  Lab Results: Recent Labs    09/09/20 1750 09/09/20 1750 09/10/20 0126 09/10/20 2032 09/11/20 0105  WBC 4.6  --  4.2  --  3.7*  HGB 9.2*   < > 8.4* 9.0* 8.2*  HCT 27.4*   < > 24.7* 26.9* 24.8*  PLT 184  --  181  --  175   < > = values in this interval not displayed.   BMET Recent Labs    09/09/20 1750 09/10/20 0126 09/11/20 0105  NA 140 138 138  K 3.8 3.3* 3.7  CL 108 108 106  CO2 21* 22 23  GLUCOSE 130* 140* 227*  BUN 6 <5* 5*  CREATININE 0.61 0.61 0.67  CALCIUM 8.1* 8.4* 8.0*   LFT Recent Labs    09/10/20 0126  PROT 4.5*  ALBUMIN 2.5*  AST 23  ALT 35  ALKPHOS 43  BILITOT 0.8   PT/INR No results for  input(s): LABPROT, INR in the last 72 hours. Hepatitis Panel No results for input(s): HEPBSAG, HCVAB, HEPAIGM, HEPBIGM in the last 72 hours.  Studies/Results: DG Chest Port 1 View  Result Date: 09/09/2020 CLINICAL DATA:  44 year old male cough. Positive COVID-19. Status post EGD. EXAM: PORTABLE CHEST 1 VIEW COMPARISON:  Chest radiograph dated 09/08/2020. FINDINGS: Bilateral peripheral and subpleural hazy densities similar to the prior radiograph. No pleural effusion or pneumothorax. Stable cardiomediastinal silhouette. No acute osseous pathology. IMPRESSION: Multifocal pneumonia. No significant interval change since 09/08/2020. Electronically Signed   By: Anner Crete M.D.   On: 09/09/2020 16:14   Scheduled Meds:  atorvastatin  40 mg Oral Daily   fluticasone  2 spray Each Nare Daily   insulin aspart  0-15 Units Subcutaneous TID WC   insulin aspart  0-5 Units Subcutaneous QHS   mometasone-formoterol  2 puff Inhalation BID   pantoprazole  40 mg Oral BID   sodium chloride flush  3 mL Intravenous Q12H   Continuous Infusions:  sodium chloride     PRN Meds:.acetaminophen **OR** acetaminophen, albuterol, ALPRAZolam, menthol-cetylpyridinium   ASSESMENT:   *   Upper GI bleed, melena. 09/09/2020 EGD with multiple gastric ulcers, these were cratered, nonbleeding, clean-based.  Largest ulcer 10 mm.  Scattered friable gastritis.  3, nonbleeding, created duodenal ulcers with clean bases, largest 5 mm.   Pathology: Slight chronic inflammation.  No H. pylori, metaplasia, dysplasia, carcinoma. Protonix 40 mg po bid in place.     *   [place] anemia.   Got 1 PRBC.   Hgb 8.6 >> 9.2  >> 8.2.    *    COVID-19 pneumonia admitted 10/1 through 10/17.  Continues to have significant respiratory symptoms.   *   Tachycardia.    PLAN   *    Has an office follow-up with Dr. Arelia Longest set for November 12.  Spoke to phlebotomy/lab [facility] 12 hours and may have arrangements for a CBC next Friday 11/5.  *     Protonix 40 mg po bid.    *    Give Colace today. Patient aware that if after the next 3 or 4 bowel movements black stools do not resolve that he needs to contact GI.  Likewise if he starts to feel very poorly again then he needs to contact his PMD or return to the ED.    Azucena Freed  09/11/2020, 10:53 AM Phone [phone]  Fluid resuscitation improved problems with tachycardia.  He is for discharge today.  Follow-up with me in the office as above.  Gatha Mayer, MD, [facility] 09/11/2020 6:25 PM

## 2020-09-11 NOTE — Discharge Summary (Signed)
Joshua Bean VZD:[id number] DOB: 12/14/1975 DOA: 09/08/2020  PCP: Jearld Fenton, NP  Admit date: 09/08/2020  Discharge date: 09/11/2020  Admitted From: Home   disposition: Home   Recommendations for Outpatient Follow-up:   Follow up with PCP in 1 week  Home Health: N/A Equipment/Devices: N/A Consultations: GI Discharge Condition: Improved CODE STATUS: Full Diet Recommendation: Heart Healthy   Diet Order            Diet Carb Modified           DIET SOFT Room service appropriate? Yes; Fluid consistency: Thin  Diet effective now                  Chief Complaint  Patient presents with  . Dehydration     Brief history of present illness from the day of admission and additional interim summary    This 44 years old male with PMH of recent Covid pneumonia (dx 10/1 and discharged 10/17 on 4L O2) newly diagnosed diabetes, anxiety, hyperlipidemia who presented in the ER with 4-day history of dark stools at home as well as increased heart rate. His hemoglobin has dropped to 9.5 from 14 in 1 week.  Stool occult blood positive.                                                                  Hospital Course   Patient was started on IV Protonix and admitted for GI bleeding.  He was transfused 1 unit PRBC with improvement in hemoglobin to 8.  GI consulted and EGD was done.  EGD showed multiple gastric ulcers, these were cratered, nonbleeding, clean-based. Largest ulcer 10 mm. Scattered friable gastritis. 3, nonbleeding, created duodenal ulcers with clean bases, largest 5 mm.  Pathology: Slight chronic inflammation.  No H. pylori, metaplasia, dysplasia, carcinoma.  Patient was placed on Protonix 40 mg p.o. twice daily. Patient's course was complicated by persistent tachycardia and relatively lower blood  pressures.  This was likely secondary to extremely conservative fluid resuscitation given recent diagnosis of Covid pneumonia with persistent infiltrates on chest x-ray and ongoing oxygen requirement.  However patient was given slow hydration and fluid resuscitation with significant improvement in his tachycardia.   Melena EGD showed multiple gastric ulcers, these were cratered, nonbleeding, clean-based. Largest ulcer 10 mm. Scattered friable gastritis. 3, nonbleeding, created duodenal ulcers with clean bases, largest 5 mm.  Pathology: Slight chronic inflammation.  No H. pylori, metaplasia, dysplasia, carcinoma.   Continue Protonix 40 twice daily Patient to follow-up with PCP in 1 week for CBC check as well as to see whether he needs to be started on iron.  Persistent tachycardia Improved improved with very gentle hydration. Patient instructed to orally hydrate at home.  History of COVID-19 pneumonia Patient was discharged on 08/30/2020 on 4  L of nasal cannula O2. He is still requiring 3 L [place] O2 Has completed a course of steroids and remdesivir Continue inhaled bronchodilators as needed.  Hypomagnesemia Repleted  Hypokalemia Repleted  DM2. Restart home medication  Anxiety and depression Continue Xanax however patient will need to get it prescribed as an outpatient by his PCP.  Dyslipidemia Restart Lipitor   Discharge diagnosis     Principal Problem:   GIB (gastrointestinal bleeding) Active Problems:   HLD (hyperlipidemia)   Diabetes mellitus (HCC)   Anxiety and depression   History of COVID-19   Symptomatic anemia   Duodenal ulcer hemorrhage   Multiple gastric ulcers   Acute blood loss anemia    Discharge instructions    Discharge Instructions    Call MD for:  difficulty breathing, headache or visual disturbances   Complete by: As directed    Call MD for:  persistant dizziness or light-headedness   Complete by: As directed    Diet Carb Modified    Complete by: As directed    Discharge instructions   Complete by: As directed    1. Pantoprazole has been started to treat  your stomach ulcer, make sure to take it twice a day. 2. See your PCP in 1 week to see how you are doing and to check your blood count and to decide if you need to be started on iron.  3. Your PCP needs to check the pathology report (it is not yet back) to see if you have H. Pylori. If you do, your PCP can give you the antibiotics you need to treat it.  4. Take it easy with walking around--as we discussed, you will be tired at first but you should be getting better a little bit every day.  5. Drink plenty of fluids and eat well, this is important for your energy.   Increase activity slowly   Complete by: As directed       Discharge Medications   Allergies as of 09/11/2020      Reactions   Bee Venom Anaphylaxis      Medication List    STOP taking these medications   aspirin 81 MG chewable tablet   predniSONE 10 MG tablet Commonly known as: DELTASONE   zinc sulfate 220 (50 Zn) MG capsule     TAKE these medications   albuterol 108 (90 Base) MCG/ACT inhaler Commonly known as: VENTOLIN HFA Inhale 2 puffs into the lungs every 6 (six) hours as needed for wheezing or shortness of breath.   ALPRAZolam 0.5 MG tablet Commonly known as: XANAX TAKE 1 TABLET BY MOUTH AT BEDTIME AS NEEDED FOR ANXIETY. What changed: See the new instructions.   ascorbic acid 500 MG tablet Commonly known as: VITAMIN C Take 1 tablet (500 mg total) by mouth daily.   atorvastatin 40 MG tablet Commonly known as: LIPITOR Take 1 tablet (40 mg total) by mouth daily.   fluticasone 50 MCG/ACT nasal spray Commonly known as: FLONASE Place 2 sprays into both nostrils daily.   guaiFENesin-dextromethorphan 100-10 MG/5ML syrup Commonly known as: ROBITUSSIN DM Take 10 mLs by mouth every 4 (four) hours as needed for cough.   insulin glargine 100 UNIT/ML Solostar Pen Commonly known as:  LANTUS Inject 30 Units into the skin daily.   insulin lispro 200 UNIT/ML KwikPen Commonly known as: HUMALOG Inject 2-10 Units into the skin 3 (three) times daily before meals. 2 units for glucose 150-200, 4 units for glucose 201-250, 6 units for glucose 251-300, 8  units for glucose 301-350, 10 units for glucose 351-400.   metFORMIN 500 MG tablet Commonly known as: Glucophage Take 1 tablet (500 mg total) by mouth 2 (two) times daily with a meal.   mometasone-formoterol 200-5 MCG/ACT Aero Commonly known as: DULERA Inhale 2 puffs into the lungs 2 (two) times daily.   multivitamin tablet Take 1 tablet by mouth daily.   pantoprazole 40 MG tablet Commonly known as: PROTONIX Take 1 tablet (40 mg total) by mouth 2 (two) times daily.   Pen Needles 3/16" 31G X 5 MM Misc Use with insulin pen as directed   sildenafil 25 MG tablet Commonly known as: VIAGRA Take 1 tablet (25 mg total) by mouth daily as needed for erectile dysfunction.        Follow-up Information    Gatha Mayer, MD Follow up on 09/25/2020.   Specialty: Gastroenterology Why: 3:30 PM/  follow up with GI doc.   Contact information: [address] [place] [address] [phone]        [facility] Follow up on 09/18/2020.   Specialty: Family Medicine Why: go to lab between 730 and 430 for lab draw.  If you miss the appointment you can go the following Monday or Tuesday. Contact information: [place] [phone]              Major procedures and Radiology Reports - PLEASE review detailed and final reports thoroughly  -       DG Chest Port 1 View  Result Date: 09/09/2020 CLINICAL DATA:  44 year old male cough. Positive COVID-19. Status post EGD. EXAM: PORTABLE CHEST 1 VIEW COMPARISON:  Chest radiograph dated 09/08/2020. FINDINGS: Bilateral peripheral and subpleural hazy densities similar to the prior radiograph. No pleural  effusion or pneumothorax. Stable cardiomediastinal silhouette. No acute osseous pathology. IMPRESSION: Multifocal pneumonia. No significant interval change since 09/08/2020. Electronically Signed   By: Anner Crete M.D.   On: 09/09/2020 16:14   DG Chest Port 1 View  Result Date: 09/08/2020 CLINICAL DATA:  Weakness, shortness of breath EXAM: PORTABLE CHEST 1 VIEW COMPARISON:  08/26/2020 FINDINGS: The heart size and mediastinal contours are within normal limits. Low lung volumes. Patchy airspace opacities bilaterally with a peripheral and basilar distribution, right worse than left. Overall findings have somewhat improved compared to the prior, particularly within the left lung. No pneumothorax. The visualized skeletal structures are unremarkable. IMPRESSION: Persistent multifocal pneumonia, right worse than left. Overall findings have slightly improved compared to the prior, particularly within the left lung. Electronically Signed   By: Davina Poke D.O.   On: 09/08/2020 11:55   DG Chest Port 1 View  Result Date: 08/26/2020 CLINICAL DATA:  Shortness of breath, cough, COVID pneumonia EXAM: PORTABLE CHEST 1 VIEW COMPARISON:  08/17/2020 FINDINGS: Low volume AP portable examination with slight interval increase in heterogeneous airspace opacity throughout the lungs, most conspicuous in the right midlung. The heart and mediastinum are unremarkable. IMPRESSION: Low volume AP portable examination with slight interval increase in heterogeneous airspace opacity throughout the lungs, most conspicuous in the right midlung, generally in keeping with worsened COVID-19 airspace disease. Electronically Signed   By: Eddie Candle M.D.   On: 08/26/2020 08:21   DG Chest Port 1 View  Result Date: 08/17/2020 CLINICAL DATA:  Shortness of breath, hypoxia, COVID-19 positive EXAM: PORTABLE CHEST 1 VIEW COMPARISON:  08/14/2020 FINDINGS: The heart size and mediastinal contours are within normal limits. Patchy airspace  opacities throughout  both lung fields with a predominantly basilar and peripheral distribution. No appreciable interval progression from prior. No pleural effusion or pneumothorax. The visualized skeletal structures are unremarkable. IMPRESSION: Patchy bilateral airspace opacities compatible with multifocal atypical/viral pneumonia. No appreciable interval progression from prior. Electronically Signed   By: Davina Poke D.O.   On: 08/17/2020 08:47   DG Chest Port 1 View  Result Date: 08/14/2020 CLINICAL DATA:  Shortness of breath.  COVID positive. EXAM: PORTABLE CHEST 1 VIEW COMPARISON:  06/05/2016 FINDINGS: Lung volumes are low. Patchy heterogeneous bilateral airspace opacities in a mid-lower lung zone predominant distribution. Normal heart size for technique. No evidence of pneumomediastinum. No significant pleural effusion. No pneumothorax. No acute osseous abnormalities are seen. Overlying oxygen tubing and EKG leads. IMPRESSION: Patchy heterogeneous bilateral airspace opacities consistent with COVID-19 pneumonia. Electronically Signed   By: Keith Rake M.D.   On: 08/14/2020 22:27   VAS [place] LOWER EXTREMITY VENOUS (DVT)  Result Date: 08/16/2020  Lower Venous DVT Study Indications: Covid-19, elevated D-Dimer.  Comparison Study: No prior study on file Performing Technologist: Sharion Dove RVS  Examination Guidelines: A complete evaluation includes B-mode imaging, spectral Doppler, color Doppler, and power Doppler as needed of all accessible portions of each vessel. Bilateral testing is considered an integral part of a complete examination. Limited examinations for reoccurring indications may be performed as noted. The reflux portion of the exam is performed with the patient in reverse Trendelenburg.  +---------+---------------+---------+-----------+----------+--------------+ RIGHT    CompressibilityPhasicitySpontaneityPropertiesThrombus Aging  +---------+---------------+---------+-----------+----------+--------------+ CFV      Full           Yes      Yes                                 +---------+---------------+---------+-----------+----------+--------------+ SFJ      Full                                                        +---------+---------------+---------+-----------+----------+--------------+ FV Prox  Full                                                        +---------+---------------+---------+-----------+----------+--------------+ FV Mid   Full                                                        +---------+---------------+---------+-----------+----------+--------------+ FV DistalFull                                                        +---------+---------------+---------+-----------+----------+--------------+ PFV      Full                                                        +---------+---------------+---------+-----------+----------+--------------+  POP      Full           Yes      Yes                                 +---------+---------------+---------+-----------+----------+--------------+ PTV      Full                                                        +---------+---------------+---------+-----------+----------+--------------+ PERO     Full                                                        +---------+---------------+---------+-----------+----------+--------------+   +---------+---------------+---------+-----------+----------+--------------+ LEFT     CompressibilityPhasicitySpontaneityPropertiesThrombus Aging +---------+---------------+---------+-----------+----------+--------------+ CFV      Full           Yes      Yes                                 +---------+---------------+---------+-----------+----------+--------------+ SFJ      Full                                                         +---------+---------------+---------+-----------+----------+--------------+ FV Prox  Full                                                        +---------+---------------+---------+-----------+----------+--------------+ FV Mid   Full                                                        +---------+---------------+---------+-----------+----------+--------------+ FV DistalFull                                                        +---------+---------------+---------+-----------+----------+--------------+ PFV      Full                                                        +---------+---------------+---------+-----------+----------+--------------+ POP      Full           Yes      Yes                                 +---------+---------------+---------+-----------+----------+--------------+  PTV      Full                                                        +---------+---------------+---------+-----------+----------+--------------+ PERO     Full                                                        +---------+---------------+---------+-----------+----------+--------------+     Summary: BILATERAL: - No evidence of deep vein thrombosis seen in the lower extremities, bilaterally. -   *See table(s) above for measurements and observations. Electronically signed by Harold Barban MD on 08/16/2020 at 7:41:42 PM.    Final     Micro Results    No results found for this or any previous visit (from the past 240 hour(s)).  Today   Subjective    Joshua Bean feels much improved since admission.  Feels ready to go home.  Denies chest pain, shortness of breath or abdominal pain.  Feels they can take care of themselves with the resources they have at home.  Objective   Blood pressure 98/65, pulse 99, temperature 99 F (37.2 C), temperature source Oral, resp. rate (!) 24, height 5\' 11"  (1.803 m), weight 95.3 kg, SpO2 98 %.   Intake/Output Summary (Last 24 hours) at  09/11/2020 1801 Last data filed at 09/11/2020 0735 Gross per 24 hour  Intake 1077.07 ml  Output 1200 ml  Net -122.93 ml    Exam General: Patient appears well and in good spirits sitting up in bed in no acute distress.  Eyes: sclera anicteric, conjuctiva mild injection bilaterally CVS: S1-S2, regular  Respiratory:  decreased air entry bilaterally secondary to decreased inspiratory effort, rales at bases  GI: NABS, soft, NT  LE: No edema.  Neuro: A/O x 3, Moving all extremities equally with normal strength, CN 3-12 intact, grossly nonfocal.  Psych: patient is logical and coherent, judgement and insight appear normal, mood and affect appropriate to situation.    Data Review   CBC w Diff:  Lab Results  Component Value Date   WBC 3.7 (L) 09/11/2020   HGB 8.2 (L) 09/11/2020   HGB 17.0 06/04/2020   HCT 24.8 (L) 09/11/2020   HCT 48.6 06/04/2020   PLT 175 09/11/2020   PLT 264 06/04/2020   LYMPHOPCT 44 09/11/2020   MONOPCT 10 09/11/2020   EOSPCT 13 09/11/2020   BASOPCT 0 09/11/2020    CMP:  Lab Results  Component Value Date   NA 138 09/11/2020   NA 129 (L) 06/04/2020   K 3.7 09/11/2020   CL 106 09/11/2020   CO2 23 09/11/2020   BUN 5 (L) 09/11/2020   BUN 12 06/04/2020   CREATININE 0.67 09/11/2020   PROT 4.5 (L) 09/10/2020   PROT 6.8 06/04/2020   ALBUMIN 2.5 (L) 09/10/2020   ALBUMIN 4.5 06/04/2020   BILITOT 0.8 09/10/2020   BILITOT 0.5 06/04/2020   ALKPHOS 43 09/10/2020   AST 23 09/10/2020   ALT 35 09/10/2020  .   Total Time in preparing paper work, data evaluation and todays exam - 35 minutes  Vashti Hey M.D on 09/11/2020 at 6:01 PM  [facility]  Office  [phone]

## 2020-09-11 NOTE — Consult Note (Signed)
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  Review of patient's medical record reveals patient is admitted for anemia. Patient also noted to have Hgb A1C 10.8 as well. Call placed to patient to offer post hospital follow up services but was unable to reach.  Primary Care Provider is Lorre Munroe, NP this provider is listed to provide the transition of care [TOC] for post hospital follow up.  Plan:  Patient can benefit from post hospital diabetes management and support. Continue to follow progress and disposition to assess for post hospital care management needs.  Will continue to follow up.  Please place a [facility] Care Management consult as appropriate and for questions contact:   Charlesetta Shanks, RN BSN CCM [facility]  [phone] business mobile phone Toll free office [phone]  Fax number: [phone] [email or web address]

## 2020-09-11 NOTE — Progress Notes (Signed)
Pt ambulated in all approx 400 ft.  Slow steady gait using RW and 3LO2.  HR up to 140.  Pt without complaints.  To recliner after walk and NS bolus started per order.  WIll cont plan of care.

## 2020-09-11 NOTE — Progress Notes (Signed)
Pt discharged home with sister who brought O2 tank from Pts house.  At DC Pts resting HR 112, SPO2 100% on 3L.  All instructions and prescriptions given and reviewed, all questions answered.

## 2020-09-13 ENCOUNTER — Encounter (HOSPITAL_COMMUNITY): Payer: Self-pay | Admitting: Gastroenterology

## 2020-09-14 ENCOUNTER — Other Ambulatory Visit: Payer: Self-pay | Admitting: *Deleted

## 2020-09-14 ENCOUNTER — Other Ambulatory Visit: Payer: Self-pay

## 2020-09-14 NOTE — Patient Outreach (Signed)
[facility]) Care Management  09/14/2020  Joshua Bean June 20, 1976 [id number]  Initial telephone outreach for [name] services. Unsuccessful, mailbox is full. Sent brief text identifying myself and reason for calling and requested a return call.  Eulah Pont. Myrtie Neither, MSN, [facility] Gerontological Nurse Practitioner [facility] Care Management [phone]

## 2020-09-15 ENCOUNTER — Telehealth: Payer: Self-pay | Admitting: Internal Medicine

## 2020-09-15 NOTE — Telephone Encounter (Signed)
He has to be seen for a hospital followup

## 2020-09-15 NOTE — Telephone Encounter (Signed)
Yes mam I will have him scheduled. Thank you

## 2020-09-15 NOTE — Telephone Encounter (Signed)
Pt called in wanted to know if he needed to see Mrs. Rollene Fare due to he was just released on Sunday and about his prescription. Please Advise

## 2020-09-18 ENCOUNTER — Other Ambulatory Visit: Payer: Self-pay | Admitting: *Deleted

## 2020-09-18 ENCOUNTER — Other Ambulatory Visit (INDEPENDENT_AMBULATORY_CARE_PROVIDER_SITE_OTHER): Payer: 59

## 2020-09-18 ENCOUNTER — Telehealth: Payer: Self-pay | Admitting: *Deleted

## 2020-09-18 ENCOUNTER — Other Ambulatory Visit: Payer: 59

## 2020-09-18 ENCOUNTER — Other Ambulatory Visit: Payer: Self-pay

## 2020-09-18 DIAGNOSIS — D62 Acute posthemorrhagic anemia: Secondary | ICD-10-CM

## 2020-09-18 NOTE — Telephone Encounter (Signed)
Ivin Booty nurse with [facility] left a voicemail stating that patient has been discharged from the hospital and they are starting up care. Ivin Booty stated that she was advised that patient has an appointment today at the office and wanted to give information on the patient. Ivin Booty stated that patient's pulse is very fast. Ivin Booty stated that blood pressure is within normal limits Ivin Booty stated that patient is dizzy and is at risk of falling. Ivin Booty wants to know if patient should be referred to cardiology?  Ivin Booty stated that she works part time and if she can not be reached to call the main office [phone] and speak with case management. Called and spoke to [place] and was advised that his heart rate was 100, but the patient told her that it has been higher than that. Ivin Booty stated that she does not have his blood pressure reading with her now, but she does remember that it was within normal limits. Georgiann Mohs that patient has a lab appointment scheduled here today, but not with his PCP. Patient has an appointment scheduled with GI on 09/25/20 for a hospital follow-up.

## 2020-09-18 NOTE — Patient Outreach (Signed)
[facility]) Care Management  09/18/2020  Joshua Bean [birth year] [id number]  Second unsuccessful outreach. Pt voicemail box was full. Called pt father who is listed as contact and that number had recording that said, Your call cannot be completed as dialed. Called primary care office to see if they had any other numbers for Mr. Haynesworth and they did not.  Will send unsuccessful letter.  Eulah Pont. Myrtie Neither, MSN, [facility] Gerontological Nurse Practitioner [facility] Care Management [phone]

## 2020-09-18 NOTE — Telephone Encounter (Signed)
He needs to schedule a hospital follow up appt with me

## 2020-09-18 NOTE — Addendum Note (Signed)
Addended by: Ellamae Sia on: 09/18/2020 04:11 PM   Modules accepted: Orders

## 2020-09-19 LAB — CBC
Hematocrit: 38.4 % (ref 37.5–51.0)
Hemoglobin: 12.5 g/dL — ABNORMAL LOW (ref 13.0–17.7)
MCH: 29.4 pg (ref 26.6–33.0)
MCHC: 32.6 g/dL (ref 31.5–35.7)
MCV: 90 fL (ref 79–97)
Platelets: 401 10*3/uL (ref 150–450)
RBC: 4.25 x10E6/uL (ref 4.14–5.80)
RDW: 16.9 % — ABNORMAL HIGH (ref 11.6–15.4)
WBC: 6.8 10*3/uL (ref 3.4–10.8)

## 2020-09-22 ENCOUNTER — Telehealth: Payer: Self-pay

## 2020-09-22 NOTE — Telephone Encounter (Signed)
FMLA PPW received, pt has Hosp f/u scheduled for 09/28/2020 as you requested, placed in your box

## 2020-09-22 NOTE — Telephone Encounter (Signed)
Please hold until appt, will fill out at appt

## 2020-09-25 ENCOUNTER — Ambulatory Visit: Payer: 59 | Admitting: Internal Medicine

## 2020-09-28 ENCOUNTER — Other Ambulatory Visit: Payer: Self-pay

## 2020-09-28 ENCOUNTER — Ambulatory Visit (INDEPENDENT_AMBULATORY_CARE_PROVIDER_SITE_OTHER)
Admission: RE | Admit: 2020-09-28 | Discharge: 2020-09-28 | Disposition: A | Discharge status: Home / Self Care | Payer: 59 | Source: Ambulatory Visit | Attending: Internal Medicine | Admitting: Internal Medicine

## 2020-09-28 ENCOUNTER — Ambulatory Visit (INDEPENDENT_AMBULATORY_CARE_PROVIDER_SITE_OTHER): Payer: 59 | Admitting: Internal Medicine

## 2020-09-28 ENCOUNTER — Encounter: Payer: Self-pay | Admitting: Internal Medicine

## 2020-09-28 ENCOUNTER — Encounter: Payer: Self-pay | Admitting: Gastroenterology

## 2020-09-28 ENCOUNTER — Other Ambulatory Visit: Payer: Self-pay | Admitting: *Deleted

## 2020-09-28 VITALS — BP 104/66 | HR 115 | Temp 96.5°F | Wt 190.0 lb

## 2020-09-28 DIAGNOSIS — J129 Viral pneumonia, unspecified: Secondary | ICD-10-CM

## 2020-09-28 DIAGNOSIS — U071 COVID-19: Secondary | ICD-10-CM

## 2020-09-28 DIAGNOSIS — K269 Duodenal ulcer, unspecified as acute or chronic, without hemorrhage or perforation: Secondary | ICD-10-CM

## 2020-09-28 DIAGNOSIS — J9601 Acute respiratory failure with hypoxia: Secondary | ICD-10-CM

## 2020-09-28 DIAGNOSIS — R000 Tachycardia, unspecified: Secondary | ICD-10-CM

## 2020-09-28 DIAGNOSIS — K253 Acute gastric ulcer without hemorrhage or perforation: Secondary | ICD-10-CM

## 2020-09-28 DIAGNOSIS — R7981 Abnormal blood-gas level: Secondary | ICD-10-CM

## 2020-09-28 MED ORDER — PANTOPRAZOLE SODIUM 40 MG PO TBEC: 40.0000 mg | DELAYED_RELEASE_TABLET | Freq: Two times a day (BID) | ORAL | 0 refills | Status: DC

## 2020-09-28 NOTE — Progress Notes (Signed)
Subjective:    Patient ID: Joshua Bean, male    DOB: 1976-07-22, 44 y.o.   MRN: [id number]  HPI  Pt presents to the clinic today for hospital follow up. He went to the ER 08/14/20 for worsening shortness of breath after Covid exposure. He did test positive for Covid. His chest xray was c/w viral pneumonia. He was treated with oxygen, Remdisivir and Barictinib. He was given IV steroids, transitioned to oral steroids and prescribed Dulera/Albuterol. His A1C was 10.8%. He was started on Metformin and Lantus. He was restarted on Atorvastatin as well. He was discharged 10/17 on 4L [place], advised to follow up with his PCP.  He presented back to the ER 09/08/20 with c/o dark stools and tachycardia. He was found to have a GI Bleed. He was transfused 1 unit PRBC, started on IV Pantoprazole. EGD showed multiple gastric and duodenal ulcers. He was transitioned to oral Pantoprazole, discharged on 09/09/20 and advised to follow up with PCP as an outpatient.  Since discharge, he reports he has been feeling better every day. His fatigue is improving. His biggest issues are his HR elevation with resting and ambulation although he feels not chest pain or chest tightness. He also reports decreased sats with ambulation. He reports his appetite is normal. He never got started on the Pantoprazole but denies abdominal pain, nausea or blood in his stool. He has not returned to work and does not think he can return quite yet. He does have FMLA forms to be completed.   Review of Systems      Past Medical History:  Diagnosis Date  . Anxiety   . Chicken pox   . Diabetes mellitus without complication ([place])   . Pneumonia   . Skin cancer of face     Current Outpatient Medications  Medication Sig Dispense Refill  . albuterol (VENTOLIN HFA) 108 (90 Base) MCG/ACT inhaler Inhale 2 puffs into the lungs every 6 (six) hours as needed for wheezing or shortness of breath. 1 each 0  . ALPRAZolam (XANAX) 0.5 MG tablet TAKE 1  TABLET BY MOUTH AT BEDTIME AS NEEDED FOR ANXIETY. (Patient taking differently: Take 0.5 mg by mouth at bedtime as needed for anxiety. ) 15 tablet 0  . ascorbic acid (VITAMIN C) 500 MG tablet Take 1 tablet (500 mg total) by mouth daily. 30 tablet 0  . atorvastatin (LIPITOR) 40 MG tablet Take 1 tablet (40 mg total) by mouth daily. 90 tablet 0  . fluticasone (FLONASE) 50 MCG/ACT nasal spray Place 2 sprays into both nostrils daily. 9.9 mL 0  . guaiFENesin-dextromethorphan (ROBITUSSIN DM) 100-10 MG/5ML syrup Take 10 mLs by mouth every 4 (four) hours as needed for cough. 118 mL 0  . insulin glargine (LANTUS) 100 UNIT/ML Solostar Pen Inject 30 Units into the skin daily. 27 mL 0  . insulin lispro (HUMALOG) 200 UNIT/ML KwikPen Inject 2-10 Units into the skin 3 (three) times daily before meals. 2 units for glucose 150-200, 4 units for glucose 201-250, 6 units for glucose 251-300, 8 units for glucose 301-350, 10 units for glucose 351-400. 54 mL 0  . Insulin Pen Needle (PEN NEEDLES 3/16") 31G X 5 MM MISC Use with insulin pen as directed 100 each 2  . metFORMIN (GLUCOPHAGE) 500 MG tablet Take 1 tablet (500 mg total) by mouth 2 (two) times daily with a meal. 180 tablet 0  . mometasone-formoterol (DULERA) 200-5 MCG/ACT AERO Inhale 2 puffs into the lungs 2 (two) times daily. 1 each 0  .  Multiple Vitamin (MULTIVITAMIN) tablet Take 1 tablet by mouth daily.    . pantoprazole (PROTONIX) 40 MG tablet Take 1 tablet (40 mg total) by mouth 2 (two) times daily. 60 tablet 0  . sildenafil (VIAGRA) 25 MG tablet Take 1 tablet (25 mg total) by mouth daily as needed for erectile dysfunction. 10 tablet 0   No current facility-administered medications for this visit.    Allergies  Allergen Reactions  . Bee Venom Anaphylaxis    Family History  Problem Relation Age of Onset  . Heart disease Mother   . Diabetes Mother   . Parkinson's disease Mother   . Heart disease Father   . Diabetes Father   . Cancer Neg Hx   . Stroke  Neg Hx     Social History   Socioeconomic History  . Marital status: Married    Spouse name: Not on file  . Number of children: Not on file  . Years of education: Not on file  . Highest education level: Not on file  Occupational History  . Not on file  Tobacco Use  . Smoking status: Never Smoker  . Smokeless tobacco: Current User    Types: Chew  Substance and Sexual Activity  . Alcohol use: Not Currently    Alcohol/week: 0.0 standard drinks  . Drug use: No  . Sexual activity: Yes  Other Topics Concern  . Not on file  Social History Narrative  . Not on file   Social Determinants of Health   Financial Resource Strain:   . Difficulty of Paying Living Expenses: Not on file  Food Insecurity:   . Worried About Charity fundraiser in the Last Year: Not on file  . Ran Out of Food in the Last Year: Not on file  Transportation Needs:   . Lack of Transportation (Medical): Not on file  . Lack of Transportation (Non-Medical): Not on file  Physical Activity:   . Days of Exercise per Week: Not on file  . Minutes of Exercise per Session: Not on file  Stress:   . Feeling of Stress : Not on file  Social Connections:   . Frequency of Communication with Friends and Family: Not on file  . Frequency of Social Gatherings with Friends and Family: Not on file  . Attends Religious Services: Not on file  . Active Member of Clubs or Organizations: Not on file  . Attends Archivist Meetings: Not on file  . Marital Status: Not on file  Intimate Partner Violence:   . Fear of Current or Ex-Partner: Not on file  . Emotionally Abused: Not on file  . Physically Abused: Not on file  . Sexually Abused: Not on file     Constitutional: Pt reports fatigue. Denies fever, malaise, headache or abrupt weight changes.  HEENT: Denies eye pain, eye redness, ear pain, ringing in the ears, wax buildup, runny nose, nasal congestion, bloody nose, or sore throat. Respiratory: Pt reports intermittent  SOB. Denies difficulty breathing, cough or sputum production.   Cardiovascular: Pt reports palpitations. Denies chest pain, chest tightness, or swelling in the hands or feet.  Gastrointestinal: Denies abdominal pain, bloating, constipation, diarrhea or blood in the stool.  Musculoskeletal: Denies decrease in range of motion, difficulty with gait, muscle pain or joint pain and swelling.  Neurological: Pt reports difficulty with memory. Denies dizziness, difficulty with speech or problems with balance and coordination.    No other specific complaints in a complete review of systems (except as listed  in HPI above).  Objective:   Physical Exam  BP 104/66   Pulse (!) 115   Temp (!) 96.5 F (35.8 C) (Temporal)   Wt 190 lb (86.2 kg)   SpO2 95%   BMI 26.50 kg/m   Wt Readings from Last 3 Encounters:  09/08/20 210 lb (95.3 kg)  08/14/20 210 lb (95.3 kg)  06/04/20 (!) 210 lb (95.3 kg)    General: Appears his stated age, well developed, well nourished in NAD. HEENT: Head: normal shape and size; Eyes: sclera white, no icterus, conjunctiva pink, PERRLA and EOMs intact;  Neck:  Neck supple, trachea midline. No masses, lumps or thyromegaly present.  Cardiovascular: Tachycardic with normal rhythm. S1,S2 noted.  No murmur, rubs or gallops noted. No JVD or BLE edema. Pulmonary/Chest: Normal effort and positive vesicular breath sounds. No respiratory distress. No wheezes, rales or ronchi noted.  Abdomen: Soft and nontender. Normal bowel sounds. No distention or masses noted.  Musculoskeletal: No difficulty with gait.  Neurological: Alert and oriented. He has some difficulty remembering some of the events surrounding his hospitalizations.    BMET    Component Value Date/Time   NA 138 09/11/2020 0105   NA 129 (L) 06/04/2020 1252   K 3.7 09/11/2020 0105   CL 106 09/11/2020 0105   CO2 23 09/11/2020 0105   GLUCOSE 227 (H) 09/11/2020 0105   BUN 5 (L) 09/11/2020 0105   BUN 12 06/04/2020 1252    CREATININE 0.67 09/11/2020 0105   CALCIUM 8.0 (L) 09/11/2020 0105   GFRNONAA >60 09/11/2020 0105   GFRAA >60 08/18/2020 0143    Lipid Panel     Component Value Date/Time   CHOL 521 (H) 06/04/2020 1252   TRIG 275 (H) 08/14/2020 2157   HDL 15 (L) 06/04/2020 1252   CHOLHDL 34.7 (H) 06/04/2020 1252   LDLCALC CANCELED 06/04/2020 1252    CBC    Component Value Date/Time   WBC 6.8 09/18/2020 1618   WBC 3.7 (L) 09/11/2020 0105   RBC 4.25 09/18/2020 1618   RBC 2.70 (L) 09/11/2020 0105   HGB 12.5 (L) 09/18/2020 1618   HCT 38.4 09/18/2020 1618   PLT 401 09/18/2020 1618   MCV 90 09/18/2020 1618   MCH 29.4 09/18/2020 1618   MCH 30.4 09/11/2020 0105   MCHC 32.6 09/18/2020 1618   MCHC 33.1 09/11/2020 0105   RDW 16.9 (H) 09/18/2020 1618   LYMPHSABS 1.7 09/11/2020 0105   LYMPHSABS 2.6 10/21/2014 1426   MONOABS 0.4 09/11/2020 0105   EOSABS 0.5 09/11/2020 0105   EOSABS 0.2 10/21/2014 1426   BASOSABS 0.0 09/11/2020 0105   BASOSABS 0.1 10/21/2014 1426    Hgb A1C Lab Results  Component Value Date   HGBA1C 10.8 (H) 08/15/2020           Assessment & Plan:   Hospital Follow Up for Covid 19, CHRF, Viral Pneumonia, Bleeding Gastric/Duodenal Ulcers, HLD, DM2:  Hospital notes, labs and imaging reviewed Will check CBC, CMET and TSH today Referral to cardiology for persistent tachycardia s/p covid Will start Metoprolol 25 mg XL daily Walking sat 97-94% on RA Chest xray today Consider referral to pulmonology pending xray results Pantoprazole filled, advised him to get started on this Advised him to avoid NSAID's OTC FMLA forms will be completed for continuous leave un 11/15/2020  Will follow up after labs, return precautions discussed  Webb Silversmith, NP This visit occurred during the SARS-CoV-2 public health emergency.  Safety protocols were in place, including screening questions prior  to the visit, additional usage of staff PPE, and extensive cleaning of exam room while observing  appropriate contact time as indicated for disinfecting solutions.

## 2020-09-29 LAB — COMPREHENSIVE METABOLIC PANEL
ALT: 17 IU/L (ref 0–44)
AST: 22 IU/L (ref 0–40)
Albumin/Globulin Ratio: 2 (ref 1.2–2.2)
Albumin: 4.6 g/dL (ref 4.0–5.0)
Alkaline Phosphatase: 89 IU/L (ref 44–121)
BUN/Creatinine Ratio: 20 (ref 9–20)
BUN: 18 mg/dL (ref 6–24)
Bilirubin Total: 0.2 mg/dL (ref 0.0–1.2)
CO2: 20 mmol/L (ref 20–29)
Calcium: 9.6 mg/dL (ref 8.7–10.2)
Chloride: 102 mmol/L (ref 96–106)
Creatinine, Ser: 0.9 mg/dL (ref 0.76–1.27)
GFR calc Af Amer: 121 mL/min/{1.73_m2} (ref >59)
GFR calc non Af Amer: 104 mL/min/{1.73_m2} (ref >59)
Globulin, Total: 2.3 g/dL (ref 1.5–4.5)
Glucose: 199 mg/dL — ABNORMAL HIGH (ref 65–99)
Potassium: 5 mmol/L (ref 3.5–5.2)
Sodium: 137 mmol/L (ref 134–144)
Total Protein: 6.9 g/dL (ref 6.0–8.5)

## 2020-09-29 LAB — CBC
Hematocrit: 43.3 % (ref 37.5–51.0)
Hemoglobin: 14.7 g/dL (ref 13.0–17.7)
MCH: 30.8 pg (ref 26.6–33.0)
MCHC: 33.9 g/dL (ref 31.5–35.7)
MCV: 91 fL (ref 79–97)
Platelets: 345 10*3/uL (ref 150–450)
RBC: 4.78 x10E6/uL (ref 4.14–5.80)
RDW: 15.3 % (ref 11.6–15.4)
WBC: 6.9 10*3/uL (ref 3.4–10.8)

## 2020-09-29 LAB — TSH: TSH: 0.672 u[IU]/mL (ref 0.450–4.500)

## 2020-09-29 NOTE — Addendum Note (Signed)
Addended by: Jearld Fenton on: 09/29/2020 02:22 PM   Modules accepted: Orders

## 2020-09-29 NOTE — Patient Instructions (Signed)
COVID-19 COVID-19 is a respiratory infection that is caused by a virus called severe acute respiratory syndrome coronavirus 2 (SARS-CoV-2). The disease is also known as coronavirus disease or novel coronavirus. In some people, the virus may not cause any symptoms. In others, it may cause a serious infection. The infection can get worse quickly and can lead to complications, such as:  Pneumonia, or infection of the lungs.  Acute respiratory distress syndrome or ARDS. This is a condition in which fluid build-up in the lungs prevents the lungs from filling with air and passing oxygen into the blood.  Acute respiratory failure. This is a condition in which there is not enough oxygen passing from the lungs to the body or when carbon dioxide is not passing from the lungs out of the body.  Sepsis or septic shock. This is a serious bodily reaction to an infection.  Blood clotting problems.  Secondary infections due to bacteria or fungus.  Organ failure. This is when your body's organs stop working. The virus that causes COVID-19 is contagious. This means that it can spread from person to person through droplets from coughs and sneezes (respiratory secretions). What are the causes? This illness is caused by a virus. You may catch the virus by:  Breathing in droplets from an infected person. Droplets can be spread by a person breathing, speaking, singing, coughing, or sneezing.  Touching something, like a table or a doorknob, that was exposed to the virus (contaminated) and then touching your mouth, nose, or eyes. What increases the risk? Risk for infection You are more likely to be infected with this virus if you:  Are within 6 feet (2 meters) of a person with COVID-19.  Provide care for or live with a person who is infected with COVID-19.  Spend time in crowded indoor spaces or live in shared housing. Risk for serious illness You are more likely to become seriously ill from the virus if you:   Are 50 years of age or older. The higher your age, the more you are at risk for serious illness.  Live in a nursing home or long-term care facility.  Have cancer.  Have a long-term (chronic) disease such as: ? Chronic lung disease, including chronic obstructive pulmonary disease or asthma. ? A long-term disease that lowers your body's ability to fight infection (immunocompromised). ? Heart disease, including heart failure, a condition in which the arteries that lead to the heart become narrow or blocked (coronary artery disease), a disease which makes the heart muscle thick, weak, or stiff (cardiomyopathy). ? Diabetes. ? Chronic kidney disease. ? Sickle cell disease, a condition in which red blood cells have an abnormal "sickle" shape. ? Liver disease.  Are obese. What are the signs or symptoms? Symptoms of this condition can range from mild to severe. Symptoms may appear any time from 2 to 14 days after being exposed to the virus. They include:  A fever or chills.  A cough.  Difficulty breathing.  Headaches, body aches, or muscle aches.  Runny or stuffy (congested) nose.  A sore throat.  New loss of taste or smell. Some people may also have stomach problems, such as nausea, vomiting, or diarrhea. Other people may not have any symptoms of COVID-19. How is this diagnosed? This condition may be diagnosed based on:  Your signs and symptoms, especially if: ? You live in an area with a COVID-19 outbreak. ? You recently traveled to or from an area where the virus is common. ? You   provide care for or live with a person who was diagnosed with COVID-19. ? You were exposed to a person who was diagnosed with COVID-19.  A physical exam.  Lab tests, which may include: ? Taking a sample of fluid from the back of your nose and throat (nasopharyngeal fluid), your nose, or your throat using a swab. ? A sample of mucus from your lungs (sputum). ? Blood tests.  Imaging tests, which  may include, X-rays, CT scan, or ultrasound. How is this treated? At present, there is no medicine to treat COVID-19. Medicines that treat other diseases are being used on a trial basis to see if they are effective against COVID-19. Your health care provider will talk with you about ways to treat your symptoms. For most people, the infection is mild and can be managed at home with rest, fluids, and over-the-counter medicines. Treatment for a serious infection usually takes places in a hospital intensive care unit (ICU). It may include one or more of the following treatments. These treatments are given until your symptoms improve.  Receiving fluids and medicines through an IV.  Supplemental oxygen. Extra oxygen is given through a tube in the nose, a face mask, or a hood.  Positioning you to lie on your stomach (prone position). This makes it easier for oxygen to get into the lungs.  Continuous positive airway pressure (CPAP) or bi-level positive airway pressure (BPAP) machine. This treatment uses mild air pressure to keep the airways open. A tube that is connected to a motor delivers oxygen to the body.  Ventilator. This treatment moves air into and out of the lungs by using a tube that is placed in your windpipe.  Tracheostomy. This is a procedure to create a hole in the neck so that a breathing tube can be inserted.  Extracorporeal membrane oxygenation (ECMO). This procedure gives the lungs a chance to recover by taking over the functions of the heart and lungs. It supplies oxygen to the body and removes carbon dioxide. Follow these instructions at home: Lifestyle  If you are sick, stay home except to get medical care. Your health care provider will tell you how long to stay home. Call your health care provider before you go for medical care.  Rest at home as told by your health care provider.  Do not use any products that contain nicotine or tobacco, such as cigarettes, e-cigarettes, and  chewing tobacco. If you need help quitting, ask your health care provider.  Return to your normal activities as told by your health care provider. Ask your health care provider what activities are safe for you. General instructions  Take over-the-counter and prescription medicines only as told by your health care provider.  Drink enough fluid to keep your urine pale yellow.  Keep all follow-up visits as told by your health care provider. This is important. How is this prevented?  There is no vaccine to help prevent COVID-19 infection. However, there are steps you can take to protect yourself and others from this virus. To protect yourself:   Do not travel to areas where COVID-19 is a risk. The areas where COVID-19 is reported change often. To identify high-risk areas and travel restrictions, check the CDC travel website: wwwnc.cdc.gov/travel/notices  If you live in, or must travel to, an area where COVID-19 is a risk, take precautions to avoid infection. ? Stay away from people who are sick. ? Wash your hands often with soap and water for 20 seconds. If soap and water   are not available, use an alcohol-based hand sanitizer. ? Avoid touching your mouth, face, eyes, or nose. ? Avoid going out in public, follow guidance from your state and local health authorities. ? If you must go out in public, wear a cloth face covering or face mask. Make sure your mask covers your nose and mouth. ? Avoid crowded indoor spaces. Stay at least 6 feet (2 meters) away from others. ? Disinfect objects and surfaces that are frequently touched every day. This may include:  Counters and tables.  Doorknobs and light switches.  Sinks and faucets.  Electronics, such as phones, remote controls, keyboards, computers, and tablets. To protect others: If you have symptoms of COVID-19, take steps to prevent the virus from spreading to others.  If you think you have a COVID-19 infection, contact your health care  provider right away. Tell your health care team that you think you may have a COVID-19 infection.  Stay home. Leave your house only to seek medical care. Do not use public transport.  Do not travel while you are sick.  Wash your hands often with soap and water for 20 seconds. If soap and water are not available, use alcohol-based hand sanitizer.  Stay away from other members of your household. Let healthy household members care for children and pets, if possible. If you have to care for children or pets, wash your hands often and wear a mask. If possible, stay in your own room, separate from others. Use a different bathroom.  Make sure that all people in your household wash their hands well and often.  Cough or sneeze into a tissue or your sleeve or elbow. Do not cough or sneeze into your hand or into the air.  Wear a cloth face covering or face mask. Make sure your mask covers your nose and mouth. Where to find more information  Centers for Disease Control and Prevention: www.cdc.gov/coronavirus/2019-ncov/index.html  World Health Organization: www.who.int/health-topics/coronavirus Contact a health care provider if:  You live in or have traveled to an area where COVID-19 is a risk and you have symptoms of the infection.  You have had contact with someone who has COVID-19 and you have symptoms of the infection. Get help right away if:  You have trouble breathing.  You have pain or pressure in your chest.  You have confusion.  You have bluish lips and fingernails.  You have difficulty waking from sleep.  You have symptoms that get worse. These symptoms may represent a serious problem that is an emergency. Do not wait to see if the symptoms will go away. Get medical help right away. Call your local emergency services (911 in the [place] Do not drive yourself to the hospital. Let the emergency medical personnel know if you think you have COVID-19. Summary  COVID-19 is a  respiratory infection that is caused by a virus. It is also known as coronavirus disease or novel coronavirus. It can cause serious infections, such as pneumonia, acute respiratory distress syndrome, acute respiratory failure, or sepsis.  The virus that causes COVID-19 is contagious. This means that it can spread from person to person through droplets from breathing, speaking, singing, coughing, or sneezing.  You are more likely to develop a serious illness if you are 50 years of age or older, have a weak immune system, live in a nursing home, or have chronic disease.  There is no medicine to treat COVID-19. Your health care provider will talk with you about ways to treat your symptoms.    Take steps to protect yourself and others from infection. Wash your hands often and disinfect objects and surfaces that are frequently touched every day. Stay away from people who are sick and wear a mask if you are sick. This information is not intended to replace advice given to you by your health care provider. Make sure you discuss any questions you have with your health care provider. Document Revised: 08/30/2019 Document Reviewed: 12/06/2018 Elsevier Patient Education  2020 Elsevier Inc.  

## 2020-10-01 ENCOUNTER — Telehealth: Payer: Self-pay

## 2020-10-01 NOTE — Telephone Encounter (Signed)
Paperwork in folder in [place] for review and signature.

## 2020-10-01 NOTE — Telephone Encounter (Signed)
Request Reference Number: [id number]. PANTOPRAZOLE TAB 40MG  is approved through 10/01/2021. Your patient may now fill this prescription and it will be covered.  Pt is aware

## 2020-10-01 NOTE — Telephone Encounter (Signed)
Called patient to inform paperwork has been faxed and ready for pickup. Expressed understanding.  Copy for patient Copy for scan

## 2020-10-01 NOTE — Telephone Encounter (Signed)
PA has been submitted via [email or web address] for [employer], awaiting response

## 2020-10-01 NOTE — Telephone Encounter (Signed)
Done, given back to [facility]

## 2020-10-05 NOTE — Telephone Encounter (Signed)
More paperwork sent in from employer. Filled and placed in Regina's inbox for review and signature.

## 2020-10-06 NOTE — Telephone Encounter (Signed)
Done, given back to [facility]

## 2020-10-06 NOTE — Telephone Encounter (Signed)
Called patient to inform of paperwork. Unable to LVM as voicemail box not available.  When patient calls please notify paperwork has been faxed for employer and copy up front for pick up.   Copy for scan Copy for patient

## 2020-10-12 NOTE — Telephone Encounter (Signed)
Joshua Bean came in to pick up his FMLA copy. Patient informed me that he called [employer] and that they are saying they didn't receive anything. I attempted to verify the correct fax number however patient stated he couldn't remember off the top of his head. Informed patient that it was successfully faxed to his employer on 10/06/2020. Informed patient I would send a message back to see if we could figure out what the issue is. Patient verbalized understanding and would like a call back, verified phone number on file. Could you please advise?

## 2020-10-13 NOTE — Telephone Encounter (Signed)
Called patient and refaxed paperwork. Patient expressed understanding. Called employer to ensure paperwork received. Unable to connect.

## 2020-10-15 NOTE — Telephone Encounter (Signed)
[employer] fmla called Joshua Bean and needed office notes from [facility] 10/26 & 11/15 .  Fax number [phone] attn: [phone](GBMSX number)

## 2020-10-16 NOTE — Telephone Encounter (Signed)
Called patient to receive verbal consent to release records. Pt gave ok. Will fax over one office notes for DOS 09/28/20, as we cannot print other records other than ours. Printed and faxed.

## 2020-10-21 NOTE — Telephone Encounter (Signed)
Request again received for office note. Faxed over once. Will fax again when PCP signs paper. Placed in your inbox.

## 2020-10-21 NOTE — Telephone Encounter (Signed)
Done, given back to [facility]

## 2020-10-21 NOTE — Telephone Encounter (Signed)
Paperwork faxed over and received.

## 2020-11-04 ENCOUNTER — Ambulatory Visit (INDEPENDENT_AMBULATORY_CARE_PROVIDER_SITE_OTHER): Payer: 59 | Admitting: Cardiology

## 2020-11-04 ENCOUNTER — Encounter: Payer: Self-pay | Admitting: Cardiology

## 2020-11-04 ENCOUNTER — Other Ambulatory Visit: Payer: Self-pay

## 2020-11-04 VITALS — BP 110/62 | HR 98 | Ht 71.0 in | Wt 201.0 lb

## 2020-11-04 DIAGNOSIS — U071 COVID-19: Secondary | ICD-10-CM | POA: Diagnosis not present

## 2020-11-04 DIAGNOSIS — E119 Type 2 diabetes mellitus without complications: Secondary | ICD-10-CM | POA: Diagnosis not present

## 2020-11-04 DIAGNOSIS — R000 Tachycardia, unspecified: Secondary | ICD-10-CM | POA: Diagnosis not present

## 2020-11-04 DIAGNOSIS — Z794 Long term (current) use of insulin: Secondary | ICD-10-CM

## 2020-11-04 NOTE — Progress Notes (Signed)
Electrophysiology Office Note:    Date:  11/04/2020   ID:  Joshua Bean, DOB 02/27/1976, MRN [id number]  PCP:  Lorre MunroeBaity, Regina W, NP  [facility] Cardiologist:  No primary care provider on file.  [facility] Electrophysiologist:  None   Referring MD: Lorre MunroeBaity, Regina W, NP   Chief Complaint: Tachycardia  History of Present Illness:    Joshua Bean is a 44 y.o. male who presents for an evaluation of tachycardia at the request of Lovie Macadamiaegina Beaty, NP. Their medical history includes recent COVID-19 infection treated with remdesivir and barictinib and home oxygen.  His post COVID-19 course was complicated by GI bleeds that were due to multiple gastric and duodenal ulcers.  His nadir hemoglobin was just over 8.  Recent hemoglobin check on September 28, 2020 showed a hemoglobin of 14.7.  He also has a recent diagnosis of diabetes.  Today he tells me that he has noticed that his average heart rates are slowly improving after discharge from his GI bleed.  His breathing is still a problem but it is slowly improving as well.  He has been whether not we needed to start a medication slow the heartbeat.  Past Medical History:  Diagnosis Date  . Anxiety   . Chicken pox   . Diabetes mellitus without complication (HCC)   . Pneumonia   . Skin cancer of face     Past Surgical History:  Procedure Laterality Date  . BACK SURGERY    . BIOPSY  09/09/2020   Procedure: BIOPSY;  Surgeon: Napoleon FormNandigam, Kavitha V, MD;  Location: [facility];  Service: Endoscopy;;  . ESOPHAGOGASTRODUODENOSCOPY (EGD) WITH PROPOFOL N/A 09/09/2020   Procedure: ESOPHAGOGASTRODUODENOSCOPY (EGD) WITH PROPOFOL;  Surgeon: Napoleon FormNandigam, Kavitha V, MD;  Location: [facility];  Service: Endoscopy;  Laterality: N/A;  . Neck surgery      Current Medications: Current Meds  Medication Sig  . albuterol (VENTOLIN HFA) 108 (90 Base) MCG/ACT inhaler Inhale 2 puffs into the lungs every 6 (six) hours as needed for wheezing or shortness of breath.   . ALPRAZolam (XANAX) 0.5 MG tablet TAKE 1 TABLET BY MOUTH AT BEDTIME AS NEEDED FOR ANXIETY.  . fluticasone (FLONASE) 50 MCG/ACT nasal spray Place 2 sprays into both nostrils daily.  . insulin glargine (LANTUS) 100 UNIT/ML Solostar Pen Inject 30 Units into the skin daily.  . insulin lispro (HUMALOG) 200 UNIT/ML KwikPen Inject 2-10 Units into the skin 3 (three) times daily before meals. 2 units for glucose 150-200, 4 units for glucose 201-250, 6 units for glucose 251-300, 8 units for glucose 301-350, 10 units for glucose 351-400.  Marland Kitchen. Insulin Pen Needle (PEN NEEDLES 3/16") 31G X 5 MM MISC Use with insulin pen as directed  . metFORMIN (GLUCOPHAGE) 500 MG tablet Take 1 tablet (500 mg total) by mouth 2 (two) times daily with a meal.  . sildenafil (VIAGRA) 25 MG tablet Take 1 tablet (25 mg total) by mouth daily as needed for erectile dysfunction.  . [DISCONTINUED] Multiple Vitamin (MULTIVITAMIN) tablet Take 1 tablet by mouth daily.     Allergies:   Bee venom   Social History   Socioeconomic History  . Marital status: Married    Spouse name: Not on file  . Number of children: Not on file  . Years of education: Not on file  . Highest education level: Not on file  Occupational History  . Not on file  Tobacco Use  . Smoking status: Never Smoker  . Smokeless tobacco: Current User  Types: Chew  Substance and Sexual Activity  . Alcohol use: Not Currently    Alcohol/week: 0.0 standard drinks  . Drug use: No  . Sexual activity: Yes  Other Topics Concern  . Not on file  Social History Narrative  . Not on file   Social Determinants of Health   Financial Resource Strain: Not on file  Food Insecurity: Not on file  Transportation Needs: Not on file  Physical Activity: Not on file  Stress: Not on file  Social Connections: Not on file     Family History: The patient's family history includes Diabetes in his father and mother; Heart disease in his father and mother; Parkinson's disease in  his mother. There is no history of Cancer or Stroke.  ROS:   Please see the history of present illness.    All other systems reviewed and are negative.  EKGs/Labs/Other Studies Reviewed:    The following studies were reviewed today: Prior notes  EKG:  The ekg ordered today demonstrates prior notes  August 16, 2020 lower extremity Dopplers showed no evidence of deep vein thrombosis  Recent Labs: 08/19/2020: B Natriuretic Peptide 79.5 09/11/2020: Magnesium 1.8 09/28/2020: ALT 17; BUN 18; Creatinine, Ser 0.90; Hemoglobin 14.7; Platelets 345; Potassium 5.0; Sodium 137; TSH 0.672  Recent Lipid Panel    Component Value Date/Time   CHOL 521 (H) 06/04/2020 1252   TRIG 275 (H) 08/14/2020 2157   HDL 15 (L) 06/04/2020 1252   CHOLHDL 34.7 (H) 06/04/2020 1252   LDLCALC CANCELED 06/04/2020 1252    Physical Exam:    VS:  BP 110/62   Pulse 98   Ht 5\' 11"  (1.803 m)   Wt 201 lb (91.2 kg)   SpO2 95%   BMI 28.03 kg/m     Wt Readings from Last 3 Encounters:  11/04/20 201 lb (91.2 kg)  09/28/20 190 lb (86.2 kg)  09/08/20 210 lb (95.3 kg)     GEN:  Well nourished, well developed in no acute distress HEENT: Normal NECK: No JVD; No carotid bruits LYMPHATICS: No lymphadenopathy CARDIAC: RRR, no murmurs, rubs, gallops RESPIRATORY:  Clear to auscultation without rales, wheezing or rhonchi  ABDOMEN: Soft, non-tender, non-distended MUSCULOSKELETAL:  No edema; No deformity  SKIN: Warm and dry NEUROLOGIC:  Alert and oriented x 3 PSYCHIATRIC:  Normal affect   ASSESSMENT:    1. Tachycardia   2. COVID-19   3. Type 2 diabetes mellitus without complication, with long-term current use of insulin (HCC)    PLAN:    In order of problems listed above:  1. Tachycardia Likely secondary to recent COVID-19 infection and significant GI bleed.  I suspect his heart rates will slowly improve as he recovers from his recent severe illness.  He tells me that he is already seeing a slow improvement in  his heart rates as his endurance level slowly improved.    I will plan on seeing him back in approximately 3 months to confirm that he is making progress.  If persistently elevated heart rates continue to be an issue at her 80-month follow-up appointment, can consider a 2-week ZIO monitor to assess heart rate variability and for any arrhythmias.  2.  Recent COVID-19 infection Slowly improving  3.  Recent diagnosis diabetes Continue insulin and metformin He tells me that his blood sugars are still slightly above goal in the 2 50-300 range some mornings.  I recommended that he touch base of his primary care physician about additional adjustments of his diabetes regimen.  Medication Adjustments/Labs and Tests Ordered: Current medicines are reviewed at length with the patient today.  Concerns regarding medicines are outlined above.  No orders of the defined types were placed in this encounter.  No orders of the defined types were placed in this encounter.    Signed, Lars Mage, MD, [facility]  11/04/2020 11:46 AM    Electrophysiology [facility]

## 2020-11-04 NOTE — Patient Instructions (Addendum)
Medication Instructions:  - Your physician recommends that you continue on your current medications as directed. Please refer to the Current Medication list given to you today.  *If you need a refill on your cardiac medications before your next appointment, please call your pharmacy*   Lab Work: - none ordered  If you have labs (blood work) drawn today and your tests are completely normal, you will receive your results only by: Marland Kitchen MyChart Message (if you have MyChart) OR . A paper copy in the mail If you have any lab test that is abnormal or we need to change your treatment, we will call you to review the results.   Testing/Procedures: - none ordered   Follow-Up: At [facility], you and your health needs are our priority.  As part of our continuing mission to provide you with exceptional heart care, we have created designated Provider Care Teams.  These Care Teams include your primary Cardiologist (physician) and Advanced Practice Providers (APPs -  Physician Assistants and Nurse Practitioners) who all work together to provide you with the care you need, when you need it.  We recommend signing up for the patient portal called "MyChart".  Sign up information is provided on this After Visit Summary.  MyChart is used to connect with patients for Virtual Visits (Telemedicine).  Patients are able to view lab/test results, encounter notes, upcoming appointments, etc.  Non-urgent messages can be sent to your provider as well.   To learn more about what you can do with MyChart, go to [email or web address].    Your next appointment:   3 month(s)  The format for your next appointment:   In Person  Provider:   Lars Mage, MD   Other Instructions n/a

## 2020-11-11 ENCOUNTER — Institutional Professional Consult (permissible substitution): Payer: 59 | Admitting: Pulmonary Disease

## 2020-11-11 NOTE — Patient Instructions (Incomplete)
Thank you for visiting Dr. Ishita Mcnerney at [facility]. Today we recommend the following: No orders of the defined types were placed in this encounter.  No orders of the defined types were placed in this encounter.  No follow-ups on file.    Please do your part to reduce the spread of COVID-19.    

## 2020-11-11 NOTE — Progress Notes (Deleted)
Synopsis: Referred in December 2021 for post Covid by Jearld Fenton, NP  Subjective:   PATIENT ID: Joshua Bean GENDER: male DOB: 04-07-1976, MRN: [id number]  No chief complaint on file.   This is a 44 year old gentleman past medical history of anxiety, type 2 diabetes, pneumonia.  Patient last seen by primary care in September 28, 2020, Golden Hurter, NP.  Office note reviewed from [place] [facility].  He was in the ER in October 2021 with worsening shortness of breath after a Covid exposure, tested positive for COVID-19 was treated with oxygen remdesivir plus baricitinib IV steroids patient was discharged from the hospital on 08/30/2020 with 4 L oxygen requirement.  Tends to have significant heart rate elevation with any type of exertion or ambulation.  Patient was referred to cardiology which was seen on 11/04/2020 is post Covid course was complicated by GI bleed with most multiple gastric and duodenal ulcers.  Recent hemoglobin was stable.  Cardiology felt that tachycardia was likely related to his recent anemia and acute illness/recovery period from COVID-19.  Office note reviewed from Lars Mage, MD 11/04/2020.  OV 11/11/2020: Referred today for evaluation of post Covid symptoms of pulmonary   ***  Past Medical History:  Diagnosis Date  . Anxiety   . Chicken pox   . Diabetes mellitus without complication ([place])   . Pneumonia   . Skin cancer of face      Family History  Problem Relation Age of Onset  . Heart disease Mother   . Diabetes Mother   . Parkinson's disease Mother   . Heart disease Father   . Diabetes Father   . Cancer Neg Hx   . Stroke Neg Hx      Past Surgical History:  Procedure Laterality Date  . BACK SURGERY    . BIOPSY  09/09/2020   Procedure: BIOPSY;  Surgeon: Mauri Pole, MD;  Location: [facility];  Service: Endoscopy;;  . ESOPHAGOGASTRODUODENOSCOPY (EGD) WITH PROPOFOL N/A 09/09/2020   Procedure: ESOPHAGOGASTRODUODENOSCOPY (EGD) WITH  PROPOFOL;  Surgeon: Mauri Pole, MD;  Location: [facility];  Service: Endoscopy;  Laterality: N/A;  . Neck surgery      Social History   Socioeconomic History  . Marital status: Married    Spouse name: Not on file  . Number of children: Not on file  . Years of education: Not on file  . Highest education level: Not on file  Occupational History  . Not on file  Tobacco Use  . Smoking status: Never Smoker  . Smokeless tobacco: Current User    Types: Chew  Substance and Sexual Activity  . Alcohol use: Not Currently    Alcohol/week: 0.0 standard drinks  . Drug use: No  . Sexual activity: Yes  Other Topics Concern  . Not on file  Social History Narrative  . Not on file   Social Determinants of Health   Financial Resource Strain: Not on file  Food Insecurity: Not on file  Transportation Needs: Not on file  Physical Activity: Not on file  Stress: Not on file  Social Connections: Not on file  Intimate Partner Violence: Not on file     Allergies  Allergen Reactions  . Bee Venom Anaphylaxis     Outpatient Medications Prior to Visit  Medication Sig Dispense Refill  . albuterol (VENTOLIN HFA) 108 (90 Base) MCG/ACT inhaler Inhale 2 puffs into the lungs every 6 (six) hours as needed for wheezing or shortness of breath. 1 each 0  .  ALPRAZolam (XANAX) 0.5 MG tablet TAKE 1 TABLET BY MOUTH AT BEDTIME AS NEEDED FOR ANXIETY. 15 tablet 0  . fluticasone (FLONASE) 50 MCG/ACT nasal spray Place 2 sprays into both nostrils daily. 9.9 mL 0  . insulin glargine (LANTUS) 100 UNIT/ML Solostar Pen Inject 30 Units into the skin daily. 27 mL 0  . insulin lispro (HUMALOG) 200 UNIT/ML KwikPen Inject 2-10 Units into the skin 3 (three) times daily before meals. 2 units for glucose 150-200, 4 units for glucose 201-250, 6 units for glucose 251-300, 8 units for glucose 301-350, 10 units for glucose 351-400. 54 mL 0  . Insulin Pen Needle (PEN NEEDLES 3/16") 31G X 5 MM MISC Use with insulin pen as  directed 100 each 2  . metFORMIN (GLUCOPHAGE) 500 MG tablet Take 1 tablet (500 mg total) by mouth 2 (two) times daily with a meal. 180 tablet 0  . sildenafil (VIAGRA) 25 MG tablet Take 1 tablet (25 mg total) by mouth daily as needed for erectile dysfunction. 10 tablet 0   No facility-administered medications prior to visit.    ROS   Objective:  Physical Exam   There were no vitals filed for this visit.   on *** LPM *** RA BMI Readings from Last 3 Encounters:  11/04/20 28.03 kg/m  09/28/20 26.50 kg/m  09/08/20 29.29 kg/m   Wt Readings from Last 3 Encounters:  11/04/20 201 lb (91.2 kg)  09/28/20 190 lb (86.2 kg)  09/08/20 210 lb (95.3 kg)     CBC    Component Value Date/Time   WBC 6.9 09/28/2020 1635   WBC 3.7 (L) 09/11/2020 0105   RBC 4.78 09/28/2020 1635   RBC 2.70 (L) 09/11/2020 0105   HGB 14.7 09/28/2020 1635   HCT 43.3 09/28/2020 1635   PLT 345 09/28/2020 1635   MCV 91 09/28/2020 1635   MCH 30.8 09/28/2020 1635   MCH 30.4 09/11/2020 0105   MCHC 33.9 09/28/2020 1635   MCHC 33.1 09/11/2020 0105   RDW 15.3 09/28/2020 1635   LYMPHSABS 1.7 09/11/2020 0105   LYMPHSABS 2.6 10/21/2014 1426   MONOABS 0.4 09/11/2020 0105   EOSABS 0.5 09/11/2020 0105   EOSABS 0.2 10/21/2014 1426   BASOSABS 0.0 09/11/2020 0105   BASOSABS 0.1 10/21/2014 1426    ***  Chest Imaging: ***  Pulmonary Functions Testing Results: No flowsheet data found.  FeNO: ***  Pathology: ***  Echocardiogram: ***  Heart Catheterization: ***    Assessment & Plan:   No diagnosis found.  Discussion: ***   Current Outpatient Medications:  .  albuterol (VENTOLIN HFA) 108 (90 Base) MCG/ACT inhaler, Inhale 2 puffs into the lungs every 6 (six) hours as needed for wheezing or shortness of breath., Disp: 1 each, Rfl: 0 .  ALPRAZolam (XANAX) 0.5 MG tablet, TAKE 1 TABLET BY MOUTH AT BEDTIME AS NEEDED FOR ANXIETY., Disp: 15 tablet, Rfl: 0 .  fluticasone (FLONASE) 50 MCG/ACT nasal spray, Place  2 sprays into both nostrils daily., Disp: 9.9 mL, Rfl: 0 .  insulin glargine (LANTUS) 100 UNIT/ML Solostar Pen, Inject 30 Units into the skin daily., Disp: 27 mL, Rfl: 0 .  insulin lispro (HUMALOG) 200 UNIT/ML KwikPen, Inject 2-10 Units into the skin 3 (three) times daily before meals. 2 units for glucose 150-200, 4 units for glucose 201-250, 6 units for glucose 251-300, 8 units for glucose 301-350, 10 units for glucose 351-400., Disp: 54 mL, Rfl: 0 .  Insulin Pen Needle (PEN NEEDLES 3/16") 31G X 5 MM MISC, Use  with insulin pen as directed, Disp: 100 each, Rfl: 2 .  metFORMIN (GLUCOPHAGE) 500 MG tablet, Take 1 tablet (500 mg total) by mouth 2 (two) times daily with a meal., Disp: 180 tablet, Rfl: 0 .  sildenafil (VIAGRA) 25 MG tablet, Take 1 tablet (25 mg total) by mouth daily as needed for erectile dysfunction., Disp: 10 tablet, Rfl: 0  I spent *** minutes dedicated to the care of this patient on the date of this encounter to include pre-visit review of records, face-to-face time with the patient discussing conditions above, post visit ordering of testing, clinical documentation with the electronic health record, making appropriate referrals as documented, and communicating necessary findings to members of the patients care team.   Josephine Igo, DO [facility] 11/11/2020 8:27 AM

## 2020-11-12 ENCOUNTER — Telehealth: Payer: Self-pay | Admitting: Internal Medicine

## 2020-11-12 NOTE — Telephone Encounter (Signed)
Pt called in wanted to know about returning back to work, due to his job is asking and he stated that NP Baity only said the first of the year. Wanted to know what to do.  Please advise

## 2020-11-12 NOTE — Telephone Encounter (Signed)
Is he feeling well enough to return?

## 2020-11-17 ENCOUNTER — Ambulatory Visit: Payer: 59 | Admitting: Internal Medicine

## 2020-11-17 ENCOUNTER — Other Ambulatory Visit: Payer: Self-pay

## 2020-11-17 ENCOUNTER — Ambulatory Visit (INDEPENDENT_AMBULATORY_CARE_PROVIDER_SITE_OTHER): Payer: 59 | Admitting: Internal Medicine

## 2020-11-17 DIAGNOSIS — R0602 Shortness of breath: Secondary | ICD-10-CM

## 2020-11-17 DIAGNOSIS — E781 Pure hyperglyceridemia: Secondary | ICD-10-CM | POA: Diagnosis not present

## 2020-11-17 DIAGNOSIS — Z794 Long term (current) use of insulin: Secondary | ICD-10-CM

## 2020-11-17 DIAGNOSIS — E119 Type 2 diabetes mellitus without complications: Secondary | ICD-10-CM

## 2020-11-17 DIAGNOSIS — R002 Palpitations: Secondary | ICD-10-CM | POA: Diagnosis not present

## 2020-11-17 DIAGNOSIS — U071 COVID-19: Secondary | ICD-10-CM

## 2020-11-17 MED ORDER — METFORMIN HCL 500 MG PO TABS: 500.0000 mg | ORAL_TABLET | Freq: Two times a day (BID) | ORAL | 0 refills | Status: DC

## 2020-11-17 MED ORDER — SILDENAFIL CITRATE 50 MG PO TABS: 50.0000 mg | ORAL_TABLET | Freq: Every day | ORAL | 2 refills | Status: DC | PRN

## 2020-11-17 MED ORDER — ALPRAZOLAM 0.5 MG PO TABS: ORAL_TABLET | ORAL | 0 refills | Status: DC

## 2020-11-17 NOTE — Progress Notes (Signed)
Subjective:    Patient ID: Joshua Bean, male    DOB: January 27, 1976, 45 y.o.   MRN: [id number]  HPI  Patient presents the clinic today for a general update about his FMLA/short-term disability.  He reports he is short-term disability was canceled by his employer and he has since returned to work after having [place].  He still reports some brain fog, fatigue and intermittent shortness of breath.  He was able to see cardiology for evaluation of his tachycardia but he missed his pulmonology appointment and has not yet rescheduled this.  He is also due to follow-up DM2 and HLD.  His last A1c was 10.8%, 08/2020.  His last LDL was 89, triglycerides 588, 11/2020.  He is currently managed on Lantus 30 units, Humalog via sliding scale and Metformin.  He was advised to start cholesterol-lowering medication but never did this.  He has had weight loss that he has been able to maintain.  He would also like a refill of Xanax and Viagra.  Review of Systems      Past Medical History:  Diagnosis Date  . Anxiety   . Chicken pox   . Diabetes mellitus without complication ([place])   . Pneumonia   . Skin cancer of face     Current Outpatient Medications  Medication Sig Dispense Refill  . albuterol (VENTOLIN HFA) 108 (90 Base) MCG/ACT inhaler Inhale 2 puffs into the lungs every 6 (six) hours as needed for wheezing or shortness of breath. 1 each 0  . ALPRAZolam (XANAX) 0.5 MG tablet TAKE 1 TABLET BY MOUTH AT BEDTIME AS NEEDED FOR ANXIETY. 15 tablet 0  . fluticasone (FLONASE) 50 MCG/ACT nasal spray Place 2 sprays into both nostrils daily. 9.9 mL 0  . insulin glargine (LANTUS) 100 UNIT/ML Solostar Pen Inject 30 Units into the skin daily. 27 mL 0  . insulin lispro (HUMALOG) 200 UNIT/ML KwikPen Inject 2-10 Units into the skin 3 (three) times daily before meals. 2 units for glucose 150-200, 4 units for glucose 201-250, 6 units for glucose 251-300, 8 units for glucose 301-350, 10 units for glucose 351-400. 54 mL 0  .  Insulin Pen Needle (PEN NEEDLES 3/16") 31G X 5 MM MISC Use with insulin pen as directed 100 each 2  . metFORMIN (GLUCOPHAGE) 500 MG tablet Take 1 tablet (500 mg total) by mouth 2 (two) times daily with a meal. 180 tablet 0  . sildenafil (VIAGRA) 25 MG tablet Take 1 tablet (25 mg total) by mouth daily as needed for erectile dysfunction. 10 tablet 0   No current facility-administered medications for this visit.    Allergies  Allergen Reactions  . Bee Venom Anaphylaxis    Family History  Problem Relation Age of Onset  . Heart disease Mother   . Diabetes Mother   . Parkinson's disease Mother   . Heart disease Father   . Diabetes Father   . Cancer Neg Hx   . Stroke Neg Hx     Social History   Socioeconomic History  . Marital status: Married    Spouse name: Not on file  . Number of children: Not on file  . Years of education: Not on file  . Highest education level: Not on file  Occupational History  . Not on file  Tobacco Use  . Smoking status: Never Smoker  . Smokeless tobacco: Current User    Types: Chew  Substance and Sexual Activity  . Alcohol use: Not Currently    Alcohol/week: 0.0 standard  drinks  . Drug use: No  . Sexual activity: Yes  Other Topics Concern  . Not on file  Social History Narrative  . Not on file   Social Determinants of Health   Financial Resource Strain: Not on file  Food Insecurity: Not on file  Transportation Needs: Not on file  Physical Activity: Not on file  Stress: Not on file  Social Connections: Not on file  Intimate Partner Violence: Not on file     Constitutional: Patient reports fatigue.  Denies fever, malaise, headache or abrupt weight changes.  HEENT: Denies eye pain, eye redness, ear pain, ringing in the ears, wax buildup, runny nose, nasal congestion, bloody nose, or sore throat. Respiratory: Patient reports intermittent shortness of breath.  Denies difficulty breathing, cough or sputum production.   Cardiovascular: Patient  reports palpitations.  Denies chest pain, chest tightness, or swelling in the hands or feet.  Neurological: Patient reports neuropathic pain.  Denies dizziness, difficulty with memory, difficulty with speech or problems with balance and coordination.  Psych: Patient has a history of anxiety.  Denies depression, SI/HI.  No other specific complaints in a complete review of systems (except as listed in HPI above).  Objective:   Physical Exam   Wt Readings from Last 3 Encounters:  11/04/20 201 lb (91.2 kg)  09/28/20 190 lb (86.2 kg)  09/08/20 210 lb (95.3 kg)    General: Appears his stated age, obese in NAD. Skin: Warm, dry and intact. No ulcerations noted. Cardiovascular: Tachycardic with rhythm. S1,S2 noted.  No murmur, rubs or gallops noted. No JVD or BLE edema.  Pulmonary/Chest: Normal effort and positive vesicular breath sounds. No respiratory distress. No wheezes, rales or ronchi noted.  Neurological: Alert and oriented.  Psychiatric: Mood and affect normal. Behavior is normal. Judgment and thought content normal.    BMET    Component Value Date/Time   NA 137 09/28/2020 1635   K 5.0 09/28/2020 1635   CL 102 09/28/2020 1635   CO2 20 09/28/2020 1635   GLUCOSE 199 (H) 09/28/2020 1635   GLUCOSE 227 (H) 09/11/2020 0105   BUN 18 09/28/2020 1635   CREATININE 0.90 09/28/2020 1635   CALCIUM 9.6 09/28/2020 1635   GFRNONAA 104 09/28/2020 1635   GFRNONAA >60 09/11/2020 0105   GFRAA 121 09/28/2020 1635    Lipid Panel     Component Value Date/Time   CHOL 521 (H) 06/04/2020 1252   TRIG 275 (H) 08/14/2020 2157   HDL 15 (L) 06/04/2020 1252   CHOLHDL 34.7 (H) 06/04/2020 1252   LDLCALC CANCELED 06/04/2020 1252    CBC    Component Value Date/Time   WBC 6.9 09/28/2020 1635   WBC 3.7 (L) 09/11/2020 0105   RBC 4.78 09/28/2020 1635   RBC 2.70 (L) 09/11/2020 0105   HGB 14.7 09/28/2020 1635   HCT 43.3 09/28/2020 1635   PLT 345 09/28/2020 1635   MCV 91 09/28/2020 1635   MCH 30.8  09/28/2020 1635   MCH 30.4 09/11/2020 0105   MCHC 33.9 09/28/2020 1635   MCHC 33.1 09/11/2020 0105   RDW 15.3 09/28/2020 1635   LYMPHSABS 1.7 09/11/2020 0105   LYMPHSABS 2.6 10/21/2014 1426   MONOABS 0.4 09/11/2020 0105   EOSABS 0.5 09/11/2020 0105   EOSABS 0.2 10/21/2014 1426   BASOSABS 0.0 09/11/2020 0105   BASOSABS 0.1 10/21/2014 1426    Hgb A1C Lab Results  Component Value Date   HGBA1C 10.8 (H) 08/15/2020  Assessment & Plan:   Palpitations, Intermittent Shortness of Breath secondary to COVID-19:  He will follow-up with cardiology as previously planned I would still like for him to see pulmonology and schedule sleep study-he will reschedule this appointment He is already returned to work, no further intervention or paperwork needed at this time  Webb Silversmith, NP This visit occurred during the SARS-CoV-2 public health emergency.  Safety protocols were in place, including screening questions prior to the visit, additional usage of staff PPE, and extensive cleaning of exam room while observing appropriate contact time as indicated for disinfecting solutions.

## 2020-11-18 LAB — LIPID PANEL
Chol/HDL Ratio: 6.8 ratio — ABNORMAL HIGH (ref 0.0–5.0)
Cholesterol, Total: 218 mg/dL — ABNORMAL HIGH (ref 100–199)
HDL: 32 mg/dL — ABNORMAL LOW (ref >39)
LDL Chol Calc (NIH): 89 mg/dL (ref 0–99)
Triglycerides: 588 mg/dL (ref 0–149)
VLDL Cholesterol Cal: 97 mg/dL — ABNORMAL HIGH (ref 5–40)

## 2020-11-20 MED ORDER — ATORVASTATIN CALCIUM 10 MG PO TABS: 10.0000 mg | ORAL_TABLET | Freq: Every day | ORAL | 2 refills | Status: DC

## 2020-11-22 ENCOUNTER — Encounter: Payer: Self-pay | Admitting: Internal Medicine

## 2020-11-22 NOTE — Assessment & Plan Note (Signed)
Lipid profile today Encouraged him to consume a low fat diet Consider statin therapy if LDL greater than 100 or triglycerides greater than 150

## 2020-11-22 NOTE — Patient Instructions (Signed)

## 2020-11-22 NOTE — Assessment & Plan Note (Signed)
Uncontrolled Continue metformin, Lantus and Humalog, refilled today Encouraged him to monitor fasting sugars Encouraged low-carb diet and exercise for weight loss Encourage routine foot exams Encourage routine eye exams Referral to endocrinology placed for medication management

## 2020-11-23 ENCOUNTER — Other Ambulatory Visit: Payer: 59

## 2020-11-25 ENCOUNTER — Telehealth: Payer: Self-pay | Admitting: Internal Medicine

## 2020-11-25 NOTE — Telephone Encounter (Signed)
Malachy Mood, [facility],called.  She faxed over 3 orders for patient that haven't been sent back. She'll fax those orders, again.

## 2020-12-10 ENCOUNTER — Encounter: Payer: Self-pay | Admitting: Internal Medicine

## 2020-12-10 ENCOUNTER — Ambulatory Visit (INDEPENDENT_AMBULATORY_CARE_PROVIDER_SITE_OTHER): Payer: 59 | Admitting: Internal Medicine

## 2020-12-10 ENCOUNTER — Other Ambulatory Visit: Payer: Self-pay

## 2020-12-10 VITALS — BP 110/70 | HR 116 | Ht 71.0 in | Wt 204.2 lb

## 2020-12-10 DIAGNOSIS — E1142 Type 2 diabetes mellitus with diabetic polyneuropathy: Secondary | ICD-10-CM | POA: Diagnosis not present

## 2020-12-10 DIAGNOSIS — E1165 Type 2 diabetes mellitus with hyperglycemia: Secondary | ICD-10-CM

## 2020-12-10 DIAGNOSIS — IMO0002 Reserved for concepts with insufficient information to code with codable children: Secondary | ICD-10-CM

## 2020-12-10 LAB — POCT GLYCOSYLATED HEMOGLOBIN (HGB A1C): Hemoglobin A1C: 10 % — AB (ref 4.0–5.6)

## 2020-12-10 MED ORDER — INSULIN LISPRO 200 UNIT/ML ~~LOC~~ SOPN: 8.0000 [IU] | PEN_INJECTOR | Freq: Three times a day (TID) | SUBCUTANEOUS | 3 refills | Status: DC

## 2020-12-10 MED ORDER — METFORMIN HCL 1000 MG PO TABS: 1000.0000 mg | ORAL_TABLET | Freq: Two times a day (BID) | ORAL | 3 refills | Status: DC

## 2020-12-10 MED ORDER — INSULIN GLARGINE 100 UNIT/ML SOLOSTAR PEN: 36.0000 [IU] | PEN_INJECTOR | Freq: Every day | SUBCUTANEOUS | 3 refills | Status: DC

## 2020-12-10 MED ORDER — OZEMPIC (0.25 OR 0.5 MG/DOSE) 2 MG/1.5ML ~~LOC~~ SOPN: 0.5000 mg | PEN_INJECTOR | SUBCUTANEOUS | 3 refills | Status: AC

## 2020-12-10 NOTE — Patient Instructions (Addendum)
STOP sodas, sweet tea, milk.   Stop eating at night.  Please increase: - Metformin 1000 mg 2x a day with meals - Lantus 36 units and move it at bedtime - Humalog 8-12 units 3x a day, 15 min before meals  Please start: - Ozempic 0.25 mg weekly in a.m. (for example on Sunday morning) x 4 weeks, then increase to 0.5 mg weekly in a.m. if no nausea or hypoglycemia.  Please schedule an appt with Antonieta Iba with nutrition.  Please let me know if the sugars are consistently <80 or >200.  Please return in 1.5 months with your sugar log.   PATIENT INSTRUCTIONS FOR TYPE 2 DIABETES:  **Please join MyChart!** - see attached instructions about how to join if you have not done so already.  DIET AND EXERCISE Diet and exercise is an important part of diabetic treatment.  We recommended aerobic exercise in the form of brisk walking (working between 40-60% of maximal aerobic capacity, similar to brisk walking) for 150 minutes per week (such as 30 minutes five days per week) along with 3 times per week performing 'resistance' training (using various gauge rubber tubes with handles) 5-10 exercises involving the major muscle groups (upper body, lower body and core) performing 10-15 repetitions (or near fatigue) each exercise. Start at half the above goal but build slowly to reach the above goals. If limited by weight, joint pain, or disability, we recommend daily walking in a swimming pool with water up to waist to reduce pressure from joints while allow for adequate exercise.    BLOOD GLUCOSES Monitoring your blood glucoses is important for continued management of your diabetes. Please check your blood glucoses 2-4 times a day: fasting, before meals and at bedtime (you can rotate these measurements - e.g. one day check before the 3 meals, the next day check before 2 of the meals and before bedtime, etc.).   HYPOGLYCEMIA (low blood sugar) Hypoglycemia is usually a reaction to not eating, exercising, or  taking too much insulin/ other diabetes drugs.  Symptoms include tremors, sweating, hunger, confusion, headache, etc. Treat IMMEDIATELY with 15 grams of Carbs: . 4 glucose tablets .  cup regular juice/soda . 2 tablespoons raisins . 4 teaspoons sugar . 1 tablespoon honey Recheck blood glucose in 15 mins and repeat above if still symptomatic/blood glucose <100.  RECOMMENDATIONS TO REDUCE YOUR RISK OF DIABETIC COMPLICATIONS: * Take your prescribed MEDICATION(S) * Follow a DIABETIC diet: Complex carbs, fiber rich foods, (monounsaturated and polyunsaturated) fats * AVOID saturated/trans fats, high fat foods, >2,300 mg salt per day. * EXERCISE at least 5 times a week for 30 minutes or preferably daily.  * DO NOT SMOKE OR DRINK more than 1 drink a day. * Check your FEET every day. Do not wear tightfitting shoes. Contact us if you develop an ulcer * See your EYE doctor once a year or more if needed * Get a FLU shot once a year * Get a PNEUMONIA vaccine once before and once after age 72 years  GOALS:  * Your Hemoglobin A1c of <7%  * fasting sugars need to be <130 * after meals sugars need to be <180 (2h after you start eating) * Your Systolic BP should be 185 or lower  * Your Diastolic BP should be 80 or lower  * Your HDL (Good Cholesterol) should be 40 or higher  * Your LDL (Bad Cholesterol) should be 100 or lower. * Your Triglycerides should be 150 or lower  * Your Urine  microalbumin (kidney function) should be <30 * Your Body Mass Index should be 25 or lower    Please consider the following ways to cut down carbs and fat and increase fiber and micronutrients in your diet: - substitute whole grain for white bread or pasta - substitute brown rice for white rice - substitute 90-calorie flat bread pieces for slices of bread when possible - substitute sweet potatoes or yams for white potatoes - substitute humus for margarine - substitute tofu for cheese when possible - substitute  almond or rice milk for regular milk (would not drink soy milk daily due to concern for soy estrogen influence on breast cancer risk) - substitute dark chocolate for other sweets when possible - substitute water - can add lemon or orange slices for taste - for diet sodas (artificial sweeteners will trick your body that you can eat sweets without getting calories and will lead you to overeating and weight gain in the long run) - do not skip breakfast or other meals (this will slow down the metabolism and will result in more weight gain over time)  - can try smoothies made from fruit and almond/rice milk in am instead of regular breakfast - can also try old-fashioned (not instant) oatmeal made with almond/rice milk in am - order the dressing on the side when eating salad at a restaurant (pour less than half of the dressing on the salad) - eat as little meat as possible - can try juicing, but should not forget that juicing will get rid of the fiber, so would alternate with eating raw veg./fruits or drinking smoothies - use as little oil as possible, even when using olive oil - can dress a salad with a mix of balsamic vinegar and lemon juice, for e.g. - use agave nectar, stevia sugar, or regular sugar rather than artificial sweateners - steam or broil/roast veggies  - snack on veggies/fruit/nuts (unsalted, preferably) when possible, rather than processed foods - reduce or eliminate aspartame in diet (it is in diet sodas, chewing gum, etc) Read the labels!  Try to read Dr. Janene Harvey book: "Program for Reversing Diabetes" for other ideas for healthy eating.

## 2020-12-10 NOTE — Progress Notes (Signed)
Patient ID: Joshua Bean, male   DOB: 1976-01-21, 45 y.o.   MRN: [id number]   This visit occurred during the SARS-CoV-2 public health emergency.  Safety protocols were in place, including screening questions prior to the visit, additional usage of staff PPE, and extensive cleaning of exam room while observing appropriate contact time as indicated for disinfecting solutions.   HPI: Joshua Bean is a 45 y.o.-year-old male, referred by his PCP, Joshua Fenton, NP , for management of DM2, dx in ~2015, but with long h/o prediabetes, insulin-dependent since 08/2020, uncontrolled, with long-term complications (PN, ED).  Reviewed HbA1c: Lab Results  Component Value Date   HGBA1C 10.8 (H) 08/15/2020   HGBA1C 11.1 (H) 06/04/2020   HGBA1C 7.0 (H) 06/06/2016   Pt is on a regimen of: - Metformin 500 mg 2x a day, with meals - Lantus 30 units in am - Humalog U200 sliding scale (2-8 units) 3x a day, before meals  Pt checks his sugars 1-2x a day and they are: - am: 101, 190-365 - 2h after b'fast: n/c - before lunch: n/c - 2h after lunch: n/c - before dinner: n/c - 2h after dinner: 295-336 - bedtime: n/c - nighttime: n/c Lowest sugar was 101; he has hypoglycemia awareness at 100s.  Highest sugar was 702 - steroids, Covid - 08/2020.  Glucometer: One Touch Mini  Pt's meals are: - Breakfast: egg and bacon during the weekend, skips OTW - Lunch: soup or sandwich - whole wheat - Dinner: meat and veggies - Snacks: Soda - 1-2 a week; sweet tea  - no CKD, last BUN/creatinine:  Lab Results  Component Value Date   BUN 18 09/28/2020   BUN 5 (L) 09/11/2020   CREATININE 0.90 09/28/2020   CREATININE 0.67 09/11/2020   - + HL; last set of lipids: Lab Results  Component Value Date   CHOL 218 (H) 11/17/2020   HDL 32 (L) 11/17/2020   LDLCALC 89 11/17/2020   TRIG 588 (HH) 11/17/2020   CHOLHDL 6.8 (H) 11/17/2020   - last eye exam was in ~2012. No DR reportedly.   - + numbness and tingling in his  feet. No burning. + pain.  Pt has FH of DM in M and F and also in  grandparents.  ROS: Constitutional: no weight gain, no weight loss, + increased appetite, + fatigue, no subjective hyperthermia, + subjective hypothermia, + nocturia and excessive urination, + poor sleep Eyes: + Blurry vision, no xerophthalmia ENT: no sore throat, no nodules palpated in neck, no dysphagia, no odynophagia, no hoarseness, + tinnitus, no hypoacusis Cardiovascular: no CP, no SOB, no palpitations, no leg swelling Respiratory: no cough, no SOB, no wheezing Gastrointestinal: no N, no V, + D, no C, no acid reflux Musculoskeletal: + Muscle aches, no joint aches Skin: no rash, no hair loss Neurological: no tremors, no numbness or tingling/no dizziness/+ HAs Psychiatric: no depression, + intermittent anxiety   + Low libido  Past Medical History:  Diagnosis Date  . Anxiety   . Chicken pox   . Diabetes mellitus without complication ([place])   . Pneumonia   . Skin cancer of face    Past Surgical History:  Procedure Laterality Date  . BACK SURGERY    . BIOPSY  09/09/2020   Procedure: BIOPSY;  Surgeon: Mauri Pole, MD;  Location: [facility];  Service: Endoscopy;;  . ESOPHAGOGASTRODUODENOSCOPY (EGD) WITH PROPOFOL N/A 09/09/2020   Procedure: ESOPHAGOGASTRODUODENOSCOPY (EGD) WITH PROPOFOL;  Surgeon: Mauri Pole, MD;  [facility]  ENDOSCOPY;  Service: Endoscopy;  Laterality: N/A;  . Neck surgery     Social History   Socioeconomic History  . Marital status: Married    Spouse name: Not on file  . Number of children: 2  . Years of education: Not on file  . Highest education level: Not on file  Occupational History  . Occupation: Lab tech-mechanic  Tobacco Use  . Smoking status: Never Smoker  . Smokeless tobacco: Current User    Types: Chew  Substance and Sexual Activity  . Alcohol use: Yes    Alcohol/week: 0.0 standard drinks    Comment: 1-2 times a month, 3 drinks at a time  . Drug use:  Yes    Types: Marijuana  . Sexual activity: Yes  Other Topics Concern  . Not on file  Social History Narrative  . Not on file   Social Determinants of Health   Financial Resource Strain: Not on file  Food Insecurity: Not on file  Transportation Needs: Not on file  Physical Activity: Not on file  Stress: Not on file  Social Connections: Not on file  Intimate Partner Violence: Not on file   Current Outpatient Medications on File Prior to Visit  Medication Sig Dispense Refill  . albuterol (VENTOLIN HFA) 108 (90 Base) MCG/ACT inhaler Inhale 2 puffs into the lungs every 6 (six) hours as needed for wheezing or shortness of breath. 1 each 0  . ALPRAZolam (XANAX) 0.5 MG tablet TAKE 1 TABLET BY MOUTH AT BEDTIME AS NEEDED FOR ANXIETY. 15 tablet 0  . atorvastatin (LIPITOR) 10 MG tablet Take 1 tablet (10 mg total) by mouth daily. 30 tablet 2  . fluticasone (FLONASE) 50 MCG/ACT nasal spray Place 2 sprays into both nostrils daily. 9.9 mL 0  . Insulin Pen Needle (PEN NEEDLES 3/16") 31G X 5 MM MISC Use with insulin pen as directed 100 each 2  . metFORMIN (GLUCOPHAGE) 500 MG tablet Take 1 tablet (500 mg total) by mouth 2 (two) times daily with a meal. 180 tablet 0  . sildenafil (VIAGRA) 50 MG tablet Take 1 tablet (50 mg total) by mouth daily as needed for erectile dysfunction. 10 tablet 2  . insulin glargine (LANTUS) 100 UNIT/ML Solostar Pen Inject 30 Units into the skin daily. 27 mL 0  . insulin lispro (HUMALOG) 200 UNIT/ML KwikPen Inject 2-10 Units into the skin 3 (three) times daily before meals. 2 units for glucose 150-200, 4 units for glucose 201-250, 6 units for glucose 251-300, 8 units for glucose 301-350, 10 units for glucose 351-400. 54 mL 0   No current facility-administered medications on file prior to visit.   Allergies  Allergen Reactions  . Bee Venom Anaphylaxis   Family History  Problem Relation Age of Onset  . Heart disease Mother   . Diabetes Mother   . Parkinson's disease  Mother   . Heart disease Father   . Diabetes Father   . Hypertension Father   . Hyperlipidemia Father   . Cancer Neg Hx   . Stroke Neg Hx    PE: BP 110/70   Pulse (!) 116   Ht 5\' 11"  (1.803 m)   Wt 204 lb 3.2 oz (92.6 kg)   SpO2 94%   BMI 28.48 kg/m  Wt Readings from Last 3 Encounters:  12/10/20 204 lb 3.2 oz (92.6 kg)  11/04/20 201 lb (91.2 kg)  09/28/20 190 lb (86.2 kg)   Constitutional: overweight, in NAD Eyes: PERRLA, EOMI, no exophthalmos ENT: moist mucous membranes,  no thyromegaly, no cervical lymphadenopathy Cardiovascular: tachycardia, RR, No MRG Respiratory: CTA B Gastrointestinal: abdomen soft, NT, ND, BS+ Musculoskeletal: no deformities, strength intact in all 4 Skin: moist, warm, no rashes Neurological: no tremor with outstretched hands, DTR normal in all 4  ASSESSMENT: 1. DM2, insulin-dependent, uncontrolled, with long-term complications -Peripheral neuropathy -ED  PLAN:  1. Patient with long-standing, uncontrolled diabetes, on oral antidiabetic + basal - bolus insulin regimen, which became insufficient. At today's visit, HbA1c is lower, but still very high, at 10%. -He is on basal insulin and only sliding scale of rapid acting insulin, which we discussed is not enough to cover his postprandial hyperglycemic excursions.  Therefore, I advised him to start mealtime insulin and to try to inject insulin 15 minutes before the meals.  As of now, he is injecting Humalog after meals. -Since all of his sugars are high, I advised him to increase the Lantus dose but also with at bedtime, since he is now taking it in the morning. -Says he tolerates well Metformin, I advised him to increase this to the target dose of 1000 mg twice a day -He is drinking sweet tea (trying to dilute it) and also regular soda (decreased intake over time) and also milk.  I strongly advised him to stop all of these to be able to improve his diabetes control.  We also discussed about how to improve  his meals including cutting out fried food and also not eating fast food (he already stopped this since he is not feeling well when he needs this).  He also agrees with a referral to nutrition.  Importantly, he is eating during the night and I strongly advised him to stop.  He mentions that he is very hungry at night and is interested in something I will cut down his appetite.  We discussed about starting a low-dose of Ozempic an increase as tolerated.  No history of pancreatitis. -Patient mentions that he is smoking marijuana and we discussed the fact that this can drop his blood sugars, but also caused nausea. -He is also drinking occasional alcohol, we discussed about the alcohol impact on the blood sugars.  I advised him not to get more than 1 drink at the time and not every day. -At next visit, when sugars improve, will need to check him for type 1 diabetes.  He does mention that he was diagnosed with borderline almost since childhood. -I suggested to: Patient Instructions  STOP sodas, sweet tea, milk.   Stop eating at night.  Please increase: - Metformin 1000 mg 2x a day with meals - Lantus 36 units and move it at bedtime - Humalog 8-12 units 3x a day, 15 min before meals  Please start: - Ozempic 0.25 mg weekly in a.m. (for example on Sunday morning) x 4 weeks, then increase to 0.5 mg weekly in a.m. if no nausea or hypoglycemia.  Please schedule an appt with Antonieta Iba with nutrition.  Please let me know if the sugars are consistently <80 or >200.  Please return in 1.5 months with your sugar log.   - Strongly advised him to start checking sugars at different times of the day - check 3x a day, rotating checks - discussed about CBG targets for treatment: 80-130 mg/dL before meals and <180 mg/dL after meals; target HbA1c <7%. - given sugar log and advised how to fill it and to bring it at next appt  - given foot care handout and explained the principles  - given  instructions for  hypoglycemia management "15-15 rule"  - advised for yearly eye exams  - Return to clinic in 1.5 mo with sugar log   Philemon Kingdom, MD PhD [facility]

## 2020-12-11 NOTE — Telephone Encounter (Signed)
[facility] ref[facility]ed the orders 3 times with Lonzo Cloud signature per their request

## 2020-12-23 ENCOUNTER — Telehealth: Payer: Self-pay | Admitting: Internal Medicine

## 2020-12-23 NOTE — Telephone Encounter (Signed)
Malachy Mood called in due to there are 3 orders that are missing, 1) resume his care-[id number](order number)  2) physician [phone]  3) physician order [id number]

## 2021-01-13 ENCOUNTER — Ambulatory Visit (INDEPENDENT_AMBULATORY_CARE_PROVIDER_SITE_OTHER): Payer: 59 | Admitting: Internal Medicine

## 2021-01-13 ENCOUNTER — Other Ambulatory Visit: Payer: Self-pay

## 2021-01-13 ENCOUNTER — Encounter: Payer: Self-pay | Admitting: Internal Medicine

## 2021-01-13 VITALS — BP 128/82 | HR 94 | Ht 71.0 in | Wt 204.0 lb

## 2021-01-13 DIAGNOSIS — E1142 Type 2 diabetes mellitus with diabetic polyneuropathy: Secondary | ICD-10-CM

## 2021-01-13 DIAGNOSIS — E781 Pure hyperglyceridemia: Secondary | ICD-10-CM | POA: Diagnosis not present

## 2021-01-13 DIAGNOSIS — E1165 Type 2 diabetes mellitus with hyperglycemia: Secondary | ICD-10-CM

## 2021-01-13 DIAGNOSIS — IMO0002 Reserved for concepts with insufficient information to code with codable children: Secondary | ICD-10-CM

## 2021-01-13 MED ORDER — INSULIN GLARGINE 100 UNIT/ML SOLOSTAR PEN: 36.0000 [IU] | PEN_INJECTOR | Freq: Every day | SUBCUTANEOUS | 3 refills | Status: AC

## 2021-01-13 NOTE — Progress Notes (Signed)
Patient ID: Joshua Bean, male   DOB: [birth year], 45 y.o.   MRN: [id number]   This visit occurred during the SARS-CoV-2 public health emergency.  Safety protocols were in place, including screening questions prior to the visit, additional usage of staff PPE, and extensive cleaning of exam room while observing appropriate contact time as indicated for disinfecting solutions.   HPI: Joshua Bean is a 45 y.o.-year-old male, initially referred by his PCP, Jearld Fenton, NP , returning for follow-up for DM2, dx in ~2015, but with long h/o prediabetes, insulin-dependent since 08/2020, uncontrolled, with long-term complications (PN, ED).  Last visit 1.5 months ago.  Since last visit, sugars improved and he is feeling better, with less foot pain and blurry vision.  Reviewed HbA1c levels: Lab Results  Component Value Date   HGBA1C 10.0 (A) 12/10/2020   HGBA1C 10.8 (H) 08/15/2020   HGBA1C 11.1 (H) 06/04/2020   HGBA1C 7.0 (H) 06/06/2016   At last visit, he was on: - Metformin 500 mg 2x a day, with meals - Lantus 30 units in am - Humalog U200 sliding scale (2-8 units) 3x a day, before meals  We changed to: - Metformin 1000 mg 2x a day with meals - Lantus 36 units and move it at bedtime  >> may forget 3x a week - >> did not increase, not taking it at all now He did not start Ozempic 0.25 mg weekly in a.m. yet, but has it at home.  Pt checks his sugars 1-2 times a day: - am: 101, 190-365 >> 187-258 - 2h after b'fast: n/c - before lunch: n/c >> 358 - 2h after lunch: n/c - before dinner: n/c >> 140-252 - 2h after dinner: 295-336 >> 334 - bedtime: n/c - nighttime: n/c Lowest sugar was 101 >> 140; he has hypoglycemia awareness at 100 Highest sugar was 702 - steroids, Covid - 08/2020 >> 358  Glucometer: One Touch Mini  Pt's meals are: - Breakfast: egg and bacon during the weekend, skips OTW - Lunch: soup or sandwich - whole wheat - Dinner: meat and veggies - Snacks:  -No CKD, last  BUN/creatinine:  Lab Results  Component Value Date   BUN 18 09/28/2020   BUN 5 (L) 09/11/2020   CREATININE 0.90 09/28/2020   CREATININE 0.67 09/11/2020   -+ HL; last set of lipids: Lab Results  Component Value Date   CHOL 218 (H) 11/17/2020   HDL 32 (L) 11/17/2020   LDLCALC 89 11/17/2020   TRIG 588 (HH) 11/17/2020   CHOLHDL 6.8 (H) 11/17/2020  On Lipitor 10.  - last eye exam was approximately 2012: No DR reportedly.  No recent eye exams.  - + numbness and tingling in his feet.  No burning. Pain much improved.  Pt has FH of DM in M and F and also in  grandparents.  ROS: Constitutional: no weight gain/no weight loss, + fatigue, no subjective hyperthermia, no subjective hypothermia Eyes: no blurry vision, no xerophthalmia ENT: no sore throat, no nodules palpated in neck, no dysphagia, no odynophagia, no hoarseness Cardiovascular: no CP/no SOB/no palpitations/no leg swelling Respiratory: no cough/no SOB/no wheezing Gastrointestinal: no N/no V/no D/no C/no acid reflux Musculoskeletal: no muscle aches/no joint aches Skin: no rashes, no hair loss Neurological: no tremors/no numbness/no tingling/no dizziness, + HAs  I reviewed pt's medications, allergies, PMH, social hx, family hx, and changes were documented in the history of present illness. Otherwise, unchanged from my initial visit note.   Past Medical History:  Diagnosis Date  .  Anxiety   . Chicken pox   . Diabetes mellitus without complication ([place])   . Pneumonia   . Skin cancer of face    Past Surgical History:  Procedure Laterality Date  . BACK SURGERY    . BIOPSY  09/09/2020   Procedure: BIOPSY;  Surgeon: Mauri Pole, MD;  Location: [facility];  Service: Endoscopy;;  . ESOPHAGOGASTRODUODENOSCOPY (EGD) WITH PROPOFOL N/A 09/09/2020   Procedure: ESOPHAGOGASTRODUODENOSCOPY (EGD) WITH PROPOFOL;  Surgeon: Mauri Pole, MD;  Location: [facility];  Service: Endoscopy;  Laterality: N/A;  . Neck surgery      Social History   Socioeconomic History  . Marital status: Married    Spouse name: Not on file  . Number of children: 2  . Years of education: Not on file  . Highest education level: Not on file  Occupational History  . Occupation: Lab tech-mechanic  Tobacco Use  . Smoking status: Never Smoker  . Smokeless tobacco: Current User    Types: Chew  Substance and Sexual Activity  . Alcohol use: Yes    Alcohol/week: 0.0 standard drinks    Comment: 1-2 times a month, 3 drinks at a time  . Drug use: Yes    Types: Marijuana  . Sexual activity: Yes  Other Topics Concern  . Not on file  Social History Narrative  . Not on file   Social Determinants of Health   Financial Resource Strain: Not on file  Food Insecurity: Not on file  Transportation Needs: Not on file  Physical Activity: Not on file  Stress: Not on file  Social Connections: Not on file  Intimate Partner Violence: Not on file   Current Outpatient Medications on File Prior to Visit  Medication Sig Dispense Refill  . albuterol (VENTOLIN HFA) 108 (90 Base) MCG/ACT inhaler Inhale 2 puffs into the lungs every 6 (six) hours as needed for wheezing or shortness of breath. 1 each 0  . ALPRAZolam (XANAX) 0.5 MG tablet TAKE 1 TABLET BY MOUTH AT BEDTIME AS NEEDED FOR ANXIETY. 15 tablet 0  . atorvastatin (LIPITOR) 10 MG tablet Take 1 tablet (10 mg total) by mouth daily. 30 tablet 2  . fluticasone (FLONASE) 50 MCG/ACT nasal spray Place 2 sprays into both nostrils daily. 9.9 mL 0  . insulin glargine (LANTUS) 100 UNIT/ML Solostar Pen Inject 36 Units into the skin at bedtime. 30 mL 3  . insulin lispro (HUMALOG) 200 UNIT/ML KwikPen Inject 8-12 Units into the skin 3 (three) times daily before meals. 30 mL 3  . Insulin Pen Needle (PEN NEEDLES 3/16") 31G X 5 MM MISC Use with insulin pen as directed 100 each 2  . metFORMIN (GLUCOPHAGE) 1000 MG tablet Take 1 tablet (1,000 mg total) by mouth 2 (two) times daily with a meal. 180 tablet 3  .  Semaglutide,0.25 or 0.5MG /DOS, (OZEMPIC, 0.25 OR 0.5 MG/DOSE,) 2 MG/1.5ML SOPN Inject 0.5 mg into the skin once a week. 4.5 mL 3  . sildenafil (VIAGRA) 50 MG tablet Take 1 tablet (50 mg total) by mouth daily as needed for erectile dysfunction. 10 tablet 2   No current facility-administered medications on file prior to visit.   Allergies  Allergen Reactions  . Bee Venom Anaphylaxis   Family History  Problem Relation Age of Onset  . Heart disease Mother   . Diabetes Mother   . Parkinson's disease Mother   . Heart disease Father   . Diabetes Father   . Hypertension Father   . Hyperlipidemia Father   .  Cancer Neg Hx   . Stroke Neg Hx    PE: BP 128/82   Pulse 94   Ht 5\' 11"  (1.803 m)   Wt 204 lb (92.5 kg)   SpO2 95%   BMI 28.45 kg/m  Wt Readings from Last 3 Encounters:  01/13/21 204 lb (92.5 kg)  12/10/20 204 lb 3.2 oz (92.6 kg)  11/04/20 201 lb (91.2 kg)   Constitutional: overweight, in NAD Eyes: PERRLA, EOMI, no exophthalmos ENT: moist mucous membranes, no thyromegaly, no cervical lymphadenopathy Cardiovascular: Tachycardia RR, No MRG Respiratory: CTA B Gastrointestinal: abdomen soft, NT, ND, BS+ Musculoskeletal: no deformities, strength intact in all 4 Skin: moist, warm, no rashes Neurological: no tremor with outstretched hands, DTR normal in all 4  ASSESSMENT: 1. DM2, insulin-dependent, uncontrolled, with long-term complications -Peripheral neuropathy -ED  2. HL  PLAN:  1. Patient with longstanding, uncontrolled, type 2 diabetes, on oral antidiabetic regimen with Metformin, also basal-bolus insulin regimen to which we added a weekly GLP-1 receptor agonist at last visit.  At that time, HbA1c was lower than before, but still very high, at 10%.  He was only on sliding scale of Humalog and we increased the doses before each meal.  I also advised him to inject Humalog at approximately 15 minutes before the meals.  He was injecting after meals.  Since all his sugars are  high, we increased his Lantus dose and moved it to evening, as he was taking it in the morning.  He tolerated Metformin well so I advised him to increase the dose to the target dose of 1000 mg twice a day.  I also advised him to Ozempic at a low dose and advised him to increase as tolerated.  He was smoking marijuana and I advised him about the drug effect on blood sugars.  We also discussed about decreasing alcohol to no more than 1 drink a day.  We discussed at length about stopping sweet drinks and improving diet.  I did refer him to nutrition but he did not have the appointment as his insurance is now covering these. -At this visit, he tells me that he stopped drinking sweet drinks and started to improve his diet.  He increase the Metformin which causes some gastric discomfort, but for now, he would not want to switch to Metformin ER.  He takes Lantus 36 units but he forgets it frequently.  Discussed about the importance of taking the day.  He tells me he did not start mealtime Humalog as advised at last visit... Also, he obtained Ozempic from the pharmacy but did not start... -Since sugars are still quite high, although improved from before, I again advised him to start Ozempic-discussed about benefits and possible side effects, and we also added a lower dose Humalog. -We will also need to check him for type 1 diabetes when sugars improve further -I suggested to: Patient Instructions  Please change: - Metformin 1000 mg 2x a day with meals - Lantus 36 units at bedtime  Please start - Humalog 6-10 units 3x a day, 15 min before meals  Please start: - Ozempic 0.25 mg weekly in a.m. (for example on Sunday morning) x 4 weeks, then increase to 0.5 mg weekly in a.m. if no nausea or hypoglycemia.  Please return in 1.5-2 months with your sugar log.   - advised to check sugars at different times of the day - 3x a day, rotating check times - advised for yearly eye exams >> he is not UTD -  return to  clinic in 2 months  2. HL - Reviewed latest lipid panel from 11/2020: LDL higher than goal of less than 70, triglycerides very high, above 500s, although improved from before Lab Results  Component Value Date   CHOL 218 (H) 11/17/2020   HDL 32 (L) 11/17/2020   LDLCALC 89 11/17/2020   TRIG 588 (HH) 11/17/2020   CHOLHDL 6.8 (H) 11/17/2020  -Continues Lipitor 10 without side effects  -At last visit, we discussed about stopping sweet drinks, milk, alcohol, and overall improving diet >> he started to do this and also with enough insulin, I suspect that next triglycerides level will improve.  If not, he needs to start fenofibrate.  Philemon Kingdom, MD PhD [facility] Endocrinology

## 2021-01-13 NOTE — Patient Instructions (Addendum)
Please change: - Metformin 1000 mg 2x a day with meals - Lantus 36 units at bedtime  Please start - Humalog 6-10 units 3x a day, 15 min before meals  Please start: - Ozempic 0.25 mg weekly in a.m. (for example on Sunday morning) x 4 weeks, then increase to 0.5 mg weekly in a.m. if no nausea or hypoglycemia.  Please return in 1.5-2 months with your sugar log.

## 2021-01-16 IMAGING — DX DG CHEST 1V PORT
1 series · 1 of 1 positions shown · non-contrast
Comparison: 06/05/2016

CLINICAL DATA: Shortness of breath.  COVID positive.

EXAM:
PORTABLE CHEST 1 VIEW

[chest]
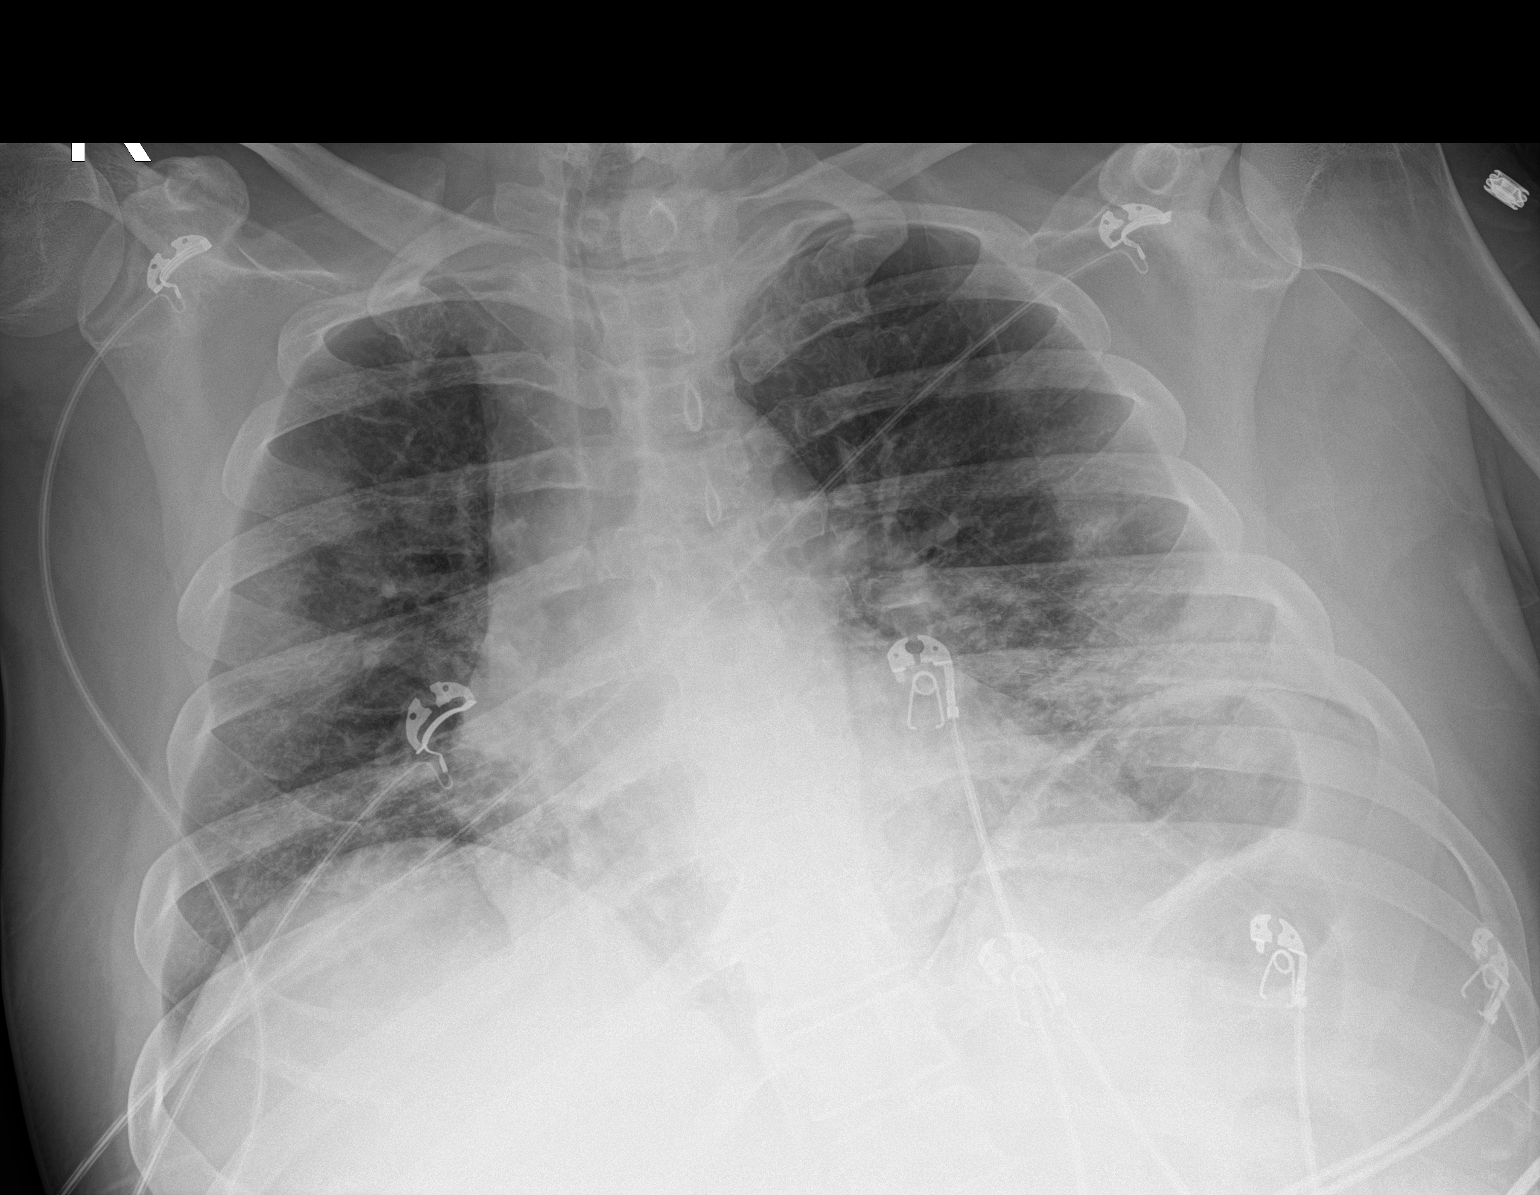

[1 of 1 positions shown; findings below may reference images not displayed]

FINDINGS: Lung volumes are low. Patchy heterogeneous bilateral airspace
opacities in a mid-lower lung zone predominant distribution. Normal
heart size for technique. No evidence of pneumomediastinum. No
significant pleural effusion. No pneumothorax. No acute osseous
abnormalities are seen. Overlying oxygen tubing and EKG leads.
IMPRESSION: Patchy heterogeneous bilateral airspace opacities consistent with
[id number] pneumonia.

## 2021-02-03 ENCOUNTER — Ambulatory Visit: Payer: 59 | Admitting: Cardiology

## 2021-02-12 ENCOUNTER — Other Ambulatory Visit: Payer: Self-pay | Admitting: Internal Medicine

## 2021-02-18 ENCOUNTER — Ambulatory Visit: Payer: 59 | Admitting: Internal Medicine

## 2021-02-18 ENCOUNTER — Other Ambulatory Visit: Payer: Self-pay | Admitting: Internal Medicine

## 2021-02-18 ENCOUNTER — Other Ambulatory Visit: Payer: 59

## 2021-02-18 DIAGNOSIS — E781 Pure hyperglyceridemia: Secondary | ICD-10-CM

## 2021-03-02 IMAGING — DX DG CHEST 2V
2 series · 2 of 2 positions shown · non-contrast
Comparison: 09/09/2020

CLINICAL DATA: 44-year-old male with COVID

EXAM:
CHEST - 2 VIEW

[chest pa]
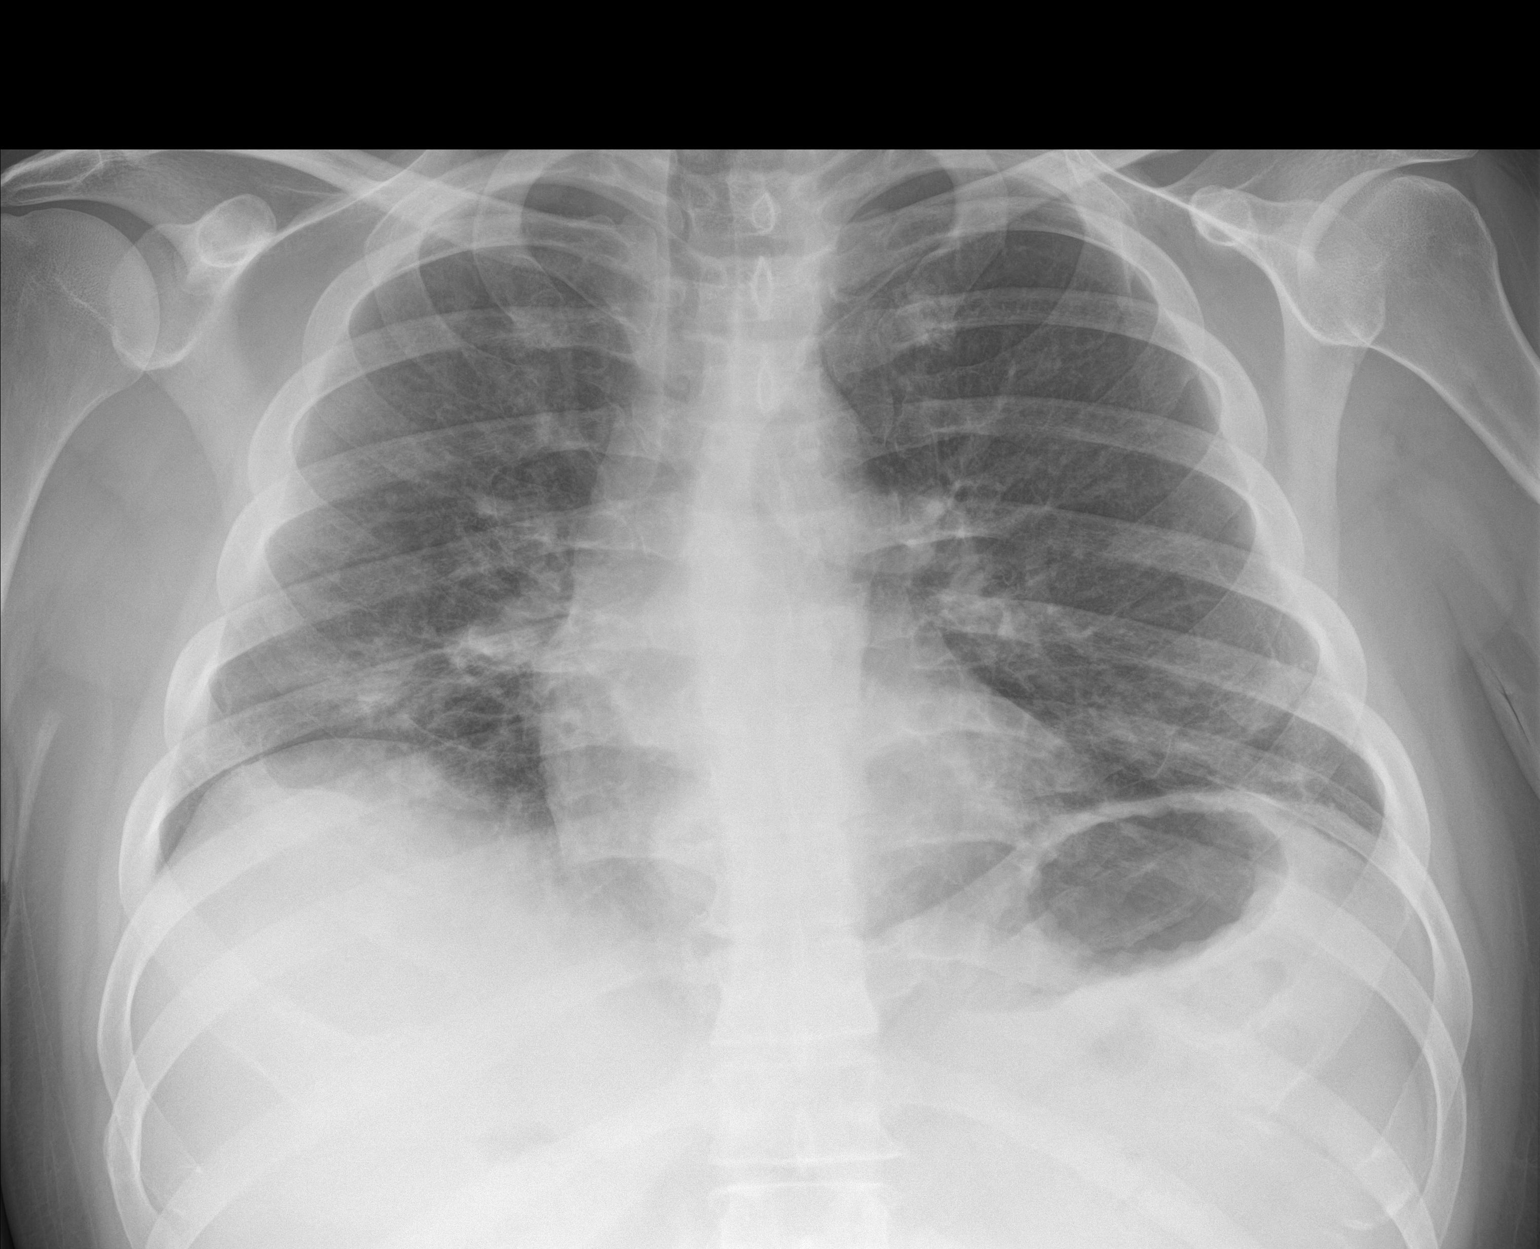

[chest lat]
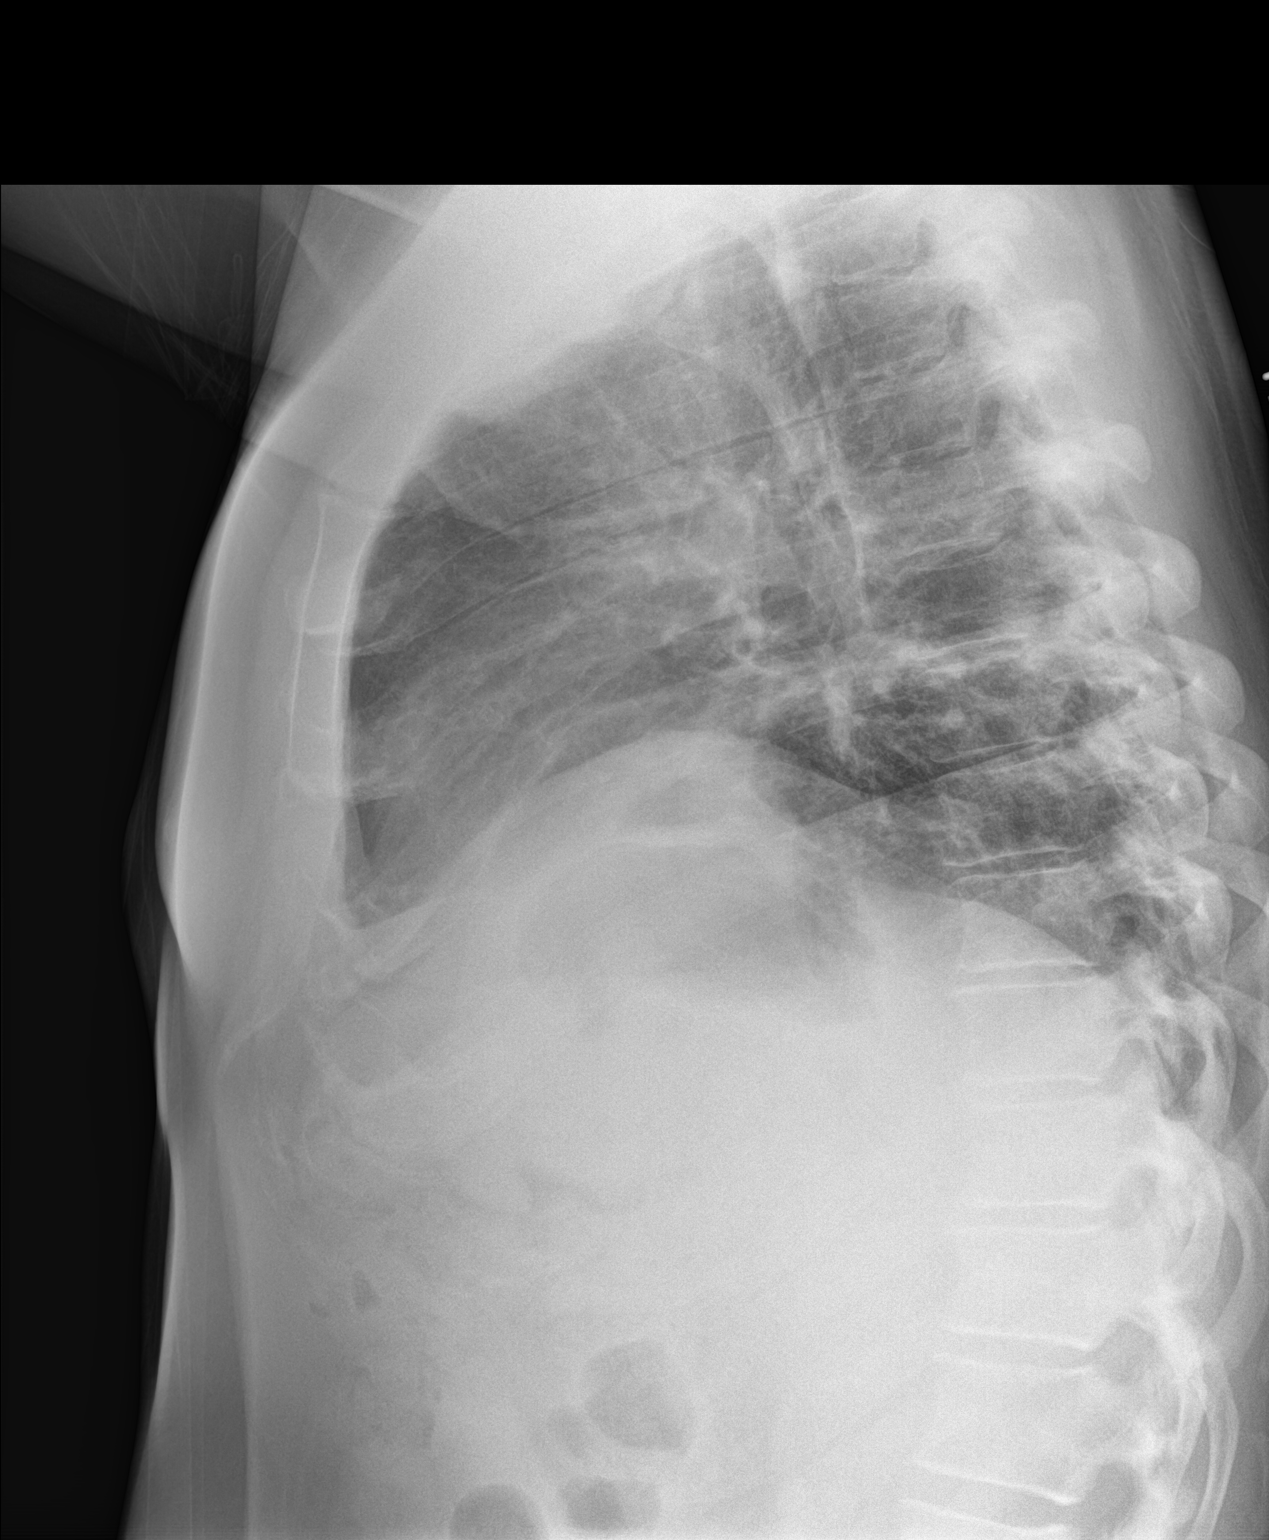

[2 of 2 positions shown; findings below may reference images not displayed]

FINDINGS: Cardiomediastinal silhouette unchanged. Persistently low lung
volumes. Similar appearance of reticulonodular opacities,
particularly at the right lung base. No pneumothorax or pleural
effusion. No acute displaced fracture.
IMPRESSION: Similar appearance of low lung volumes and reticulonodular opacities
compatible with the given history of pneumonia.

## 2021-03-23 ENCOUNTER — Ambulatory Visit: Payer: 59 | Admitting: Internal Medicine

## 2021-07-16 ENCOUNTER — Other Ambulatory Visit: Payer: Self-pay | Admitting: Internal Medicine

## 2021-07-16 NOTE — Telephone Encounter (Signed)
  Notes to clinic Not a pt in this practice.

## 2021-07-20 NOTE — Telephone Encounter (Signed)
Last OV- 11/17/2020 Next OV- Cancelled (no show/ cancel in 24 hours) 02/18/2021 Last Filled- 11/17/2020

## 2021-11-04 ENCOUNTER — Other Ambulatory Visit: Payer: Self-pay

## 2021-11-04 ENCOUNTER — Encounter: Payer: Self-pay | Admitting: Family

## 2021-11-04 ENCOUNTER — Telehealth (INDEPENDENT_AMBULATORY_CARE_PROVIDER_SITE_OTHER): Payer: 59 | Admitting: Family

## 2021-11-04 VITALS — Temp 99.0°F | Ht 71.0 in | Wt 205.0 lb

## 2021-11-04 DIAGNOSIS — Z794 Long term (current) use of insulin: Secondary | ICD-10-CM

## 2021-11-04 DIAGNOSIS — R051 Acute cough: Secondary | ICD-10-CM

## 2021-11-04 DIAGNOSIS — Z91199 Patient's noncompliance with other medical treatment and regimen due to unspecified reason: Secondary | ICD-10-CM | POA: Diagnosis not present

## 2021-11-04 DIAGNOSIS — E1142 Type 2 diabetes mellitus with diabetic polyneuropathy: Secondary | ICD-10-CM | POA: Diagnosis not present

## 2021-11-04 DIAGNOSIS — J069 Acute upper respiratory infection, unspecified: Secondary | ICD-10-CM

## 2021-11-04 LAB — POC INFLUENZA A&B (BINAX/QUICKVUE)
Influenza A, POC: NEGATIVE
Influenza B, POC: NEGATIVE

## 2021-11-04 LAB — POC COVID19 BINAXNOW: SARS Coronavirus 2 Ag: NEGATIVE

## 2021-11-04 MED ORDER — METFORMIN HCL 1000 MG PO TABS: 1000.0000 mg | ORAL_TABLET | Freq: Two times a day (BID) | ORAL | 0 refills | Status: DC

## 2021-11-04 MED ORDER — AMOXICILLIN-POT CLAVULANATE 875-125 MG PO TABS: 1.0000 | ORAL_TABLET | Freq: Two times a day (BID) | ORAL | 0 refills | Status: AC

## 2021-11-04 NOTE — Patient Instructions (Addendum)
Call this number when you arrive at the back of the building and let them know you are here, they will come out and swab you.   [phone]  This is the address   [place] around to the back of the building, and call the number they will come out. It is the building that says [employer] on the front side.   Antibiotic sent to preferred pharmacy.   Please increase oral fluids, steamy hot shower/humidifier prn.  Please follow up if no improvement in 2-3 days.   It was a pleasure seeing you today! Please do not hesitate to reach out with any questions and or concerns.  Regards,   Eugenia Pancoast

## 2021-11-04 NOTE — Assessment & Plan Note (Signed)
Ordered flu and covid testing, pt to come to office to obtain, will f/u with pt on results.  Take antibiotic as prescribed. Increase oral fluids. Pt to f/u if sx worsen and or fail to improve in 2-3 days.

## 2021-11-04 NOTE — Assessment & Plan Note (Signed)
Discussed compliance, and risk of not complying with risk of declining health especially with DM2

## 2021-11-04 NOTE — Progress Notes (Signed)
MyChart Video Visit    Virtual Visit via Video Note   This visit type was conducted due to national recommendations for restrictions regarding the COVID-19 Pandemic (e.g. social distancing) in an effort to limit this patient's exposure and mitigate transmission in our community. This patient is at least at moderate risk for complications without adequate follow up. This format is felt to be most appropriate for this patient at this time. Physical exam was limited by quality of the video and audio technology used for the visit. CMA was able to get the patient set up on a video visit.  Patient location: Home. Patient and provider in visit Provider location: Office  I discussed the limitations of evaluation and management by telemedicine and the availability of in person appointments. The patient expressed understanding and agreed to proceed.  Visit Date: 11/04/2021  Today's healthcare provider: Eugenia Pancoast, FNP     Subjective:    Patient ID: Joshua Bean, male    DOB: [birth year], 45 y.o.   MRN: [id number]  Chief Complaint  Patient presents with   Nasal Congestion   head cold   dry cough    HPI  Pt here with c/o three week h/o sinus congestion and dry nonproductive cough.  Last night started with sore throat, felt 'crappy', body aches, nasal congestion and still with cough.  Both of his kids are sick at home as well. Has not tested for covid. No wheezing and or sob.  No fever, a little bit of chills last night. Lats night temp was 99.7 F  DM2: uncontrolled pt has been non-compliant. He has been overdue for f/u with endo and doesn't really want to go back to her. Last visit was 01/14/32, was changed to metformin 1000 mg bid with meals, lantus 36 units qhs and started on ozempic 0.25 mg weekly in am. Last A1C:  He was told to start on ozempic last visit with endo, but states he 'only takes it when his sugars run high' on average he states sugars are 220 or greater.   Lab  Results  Component Value Date   HGBA1C 10.0 (A) 12/10/2020     Past Medical History:  Diagnosis Date   Anxiety    Chicken pox    Diabetes mellitus without complication ([place])    Pneumonia    Skin cancer of face     Past Surgical History:  Procedure Laterality Date   BACK SURGERY     BIOPSY  09/09/2020   Procedure: BIOPSY;  Surgeon: Mauri Pole, MD;  Location: [facility];  Service: Endoscopy;;   ESOPHAGOGASTRODUODENOSCOPY (EGD) WITH PROPOFOL N/A 09/09/2020   Procedure: ESOPHAGOGASTRODUODENOSCOPY (EGD) WITH PROPOFOL;  Surgeon: Mauri Pole, MD;  Location: [facility];  Service: Endoscopy;  Laterality: N/A;   Neck surgery      Family History  Problem Relation Age of Onset   Heart disease Mother    Diabetes Mother    Parkinson's disease Mother    Heart disease Father    Diabetes Father    Hypertension Father    Hyperlipidemia Father    Cancer Neg Hx    Stroke Neg Hx     Social History   Socioeconomic History   Marital status: Divorced    Spouse name: Not on file   Number of children: 2   Years of education: Not on file   Highest education level: Not on file  Occupational History   Occupation: Lab tech-mechanic  Tobacco Use   Smoking  status: Never   Smokeless tobacco: Current    Types: Chew  Substance and Sexual Activity   Alcohol use: Yes    Alcohol/week: 0.0 standard drinks    Comment: 1-2 times a month, 3 drinks at a time   Drug use: Yes    Types: Marijuana   Sexual activity: Yes  Other Topics Concern   Not on file  Social History Narrative   Not on file   Social Determinants of Health   Financial Resource Strain: Not on file  Food Insecurity: Not on file  Transportation Needs: Not on file  Physical Activity: Not on file  Stress: Not on file  Social Connections: Not on file  Intimate Partner Violence: Not on file    Outpatient Medications Prior to Visit  Medication Sig Dispense Refill   albuterol (VENTOLIN HFA) 108 (90 Base)  MCG/ACT inhaler Inhale 2 puffs into the lungs every 6 (six) hours as needed for wheezing or shortness of breath. 1 each 0   ALPRAZolam (XANAX) 0.5 MG tablet TAKE 1 TABLET BY MOUTH AT BEDTIME AS NEEDED FOR ANXIETY 15 tablet 0   atorvastatin (LIPITOR) 10 MG tablet Take 1 tablet (10 mg total) by mouth daily. 30 tablet 2   fluticasone (FLONASE) 50 MCG/ACT nasal spray Place 2 sprays into both nostrils daily. 9.9 mL 0   Insulin Pen Needle (PEN NEEDLES 3/16") 31G X 5 MM MISC Use with insulin pen as directed 100 each 2   sildenafil (VIAGRA) 50 MG tablet Take 1 tablet (50 mg total) by mouth daily as needed for erectile dysfunction. 10 tablet 2   insulin glargine (LANTUS) 100 UNIT/ML Solostar Pen Inject 36 Units into the skin at bedtime. 30 mL 3   insulin lispro (HUMALOG) 200 UNIT/ML KwikPen Inject 8-12 Units into the skin 3 (three) times daily before meals. (Patient not taking: Reported on 01/13/2021) 30 mL 3   Semaglutide,0.25 or 0.5MG /DOS, (OZEMPIC, 0.25 OR 0.5 MG/DOSE,) 2 MG/1.5ML SOPN Inject 0.5 mg into the skin once a week. (Patient not taking: Reported on 01/13/2021) 4.5 mL 3   metFORMIN (GLUCOPHAGE) 1000 MG tablet Take 1 tablet (1,000 mg total) by mouth 2 (two) times daily with a meal. 180 tablet 3   No facility-administered medications prior to visit.    Allergies  Allergen Reactions   Bee Venom Anaphylaxis    Review of Systems  Constitutional:  Negative for chills and fever.  HENT:  Positive for congestion, postnasal drip, sinus pressure and sore throat. Negative for ear discharge, ear pain and sinus pain.   Eyes:  Negative for discharge.  Respiratory:  Positive for cough (dry non productive cough). Negative for shortness of breath and wheezing.   Cardiovascular:  Negative for chest pain and palpitations.      Objective:    Physical Exam Constitutional:      General: He is not in acute distress.    Appearance: Normal appearance. He is obese. He is not ill-appearing, toxic-appearing or  diaphoretic.  HENT:     Head: Normocephalic.  Pulmonary:     Effort: Pulmonary effort is normal.  Neurological:     General: No focal deficit present.     Mental Status: He is alert and oriented to person, place, and time.  Psychiatric:        Mood and Affect: Mood normal.        Behavior: Behavior normal.        Thought Content: Thought content normal.    Temp 99 F (37.2 C) (  Oral)    Ht 5\' 11"  (1.803 m)    Wt 205 lb (93 kg)    BMI 28.59 kg/m  Wt Readings from Last 3 Encounters:  11/04/21 205 lb (93 kg)  01/13/21 204 lb (92.5 kg)  12/10/20 204 lb 3.2 oz (92.6 kg)       Assessment & Plan:   Problem List Items Addressed This Visit       Respiratory   Upper respiratory infection, acute    Ordered flu and covid testing, pt to come to office to obtain, will f/u with pt on results.  Take antibiotic as prescribed. Increase oral fluids. Pt to f/u if sx worsen and or fail to improve in 2-3 days.         Endocrine   Diabetes mellitus ([place]) - Primary    D/w pt noncompliance, advised pt I will refill metformin but need to f/u in office for [facility] and we will draw lab work to decide how to treat further as has been out of ozempic x two months and insulin x 2 months. Only taking metformin here and there. Work on a diabetic diet, maintain glucose log, and schedule f/u in two weeks. Pt agrees to plan.      Relevant Medications   metFORMIN (GLUCOPHAGE) 1000 MG tablet   amoxicillin-clavulanate (AUGMENTIN) 875-125 MG tablet     Other   Medically noncompliant    Discussed compliance, and risk of not complying with risk of declining health especially with DM2      Acute cough   Relevant Orders   POC Influenza A&B(BINAX/QUICKVUE) (Completed)   POC COVID-19 (Completed)    I am having Domenick Gong. Lennox Grumbles "Shanon Brow" start on amoxicillin-clavulanate. I am also having him maintain his albuterol, fluticasone, Pen Needles 3/16", sildenafil, atorvastatin, Ozempic (0.25 or 0.5 MG/DOSE), insulin  lispro, insulin glargine, ALPRAZolam, and metFORMIN.  Meds ordered this encounter  Medications   metFORMIN (GLUCOPHAGE) 1000 MG tablet    Sig: Take 1 tablet (1,000 mg total) by mouth 2 (two) times daily with a meal.    Dispense:  60 tablet    Refill:  0    Order Specific Question:   Supervising Provider    Answer:   BEDSOLE, AMY E [[id number]]   amoxicillin-clavulanate (AUGMENTIN) 875-125 MG tablet    Sig: Take 1 tablet by mouth 2 (two) times daily for 10 days.    Dispense:  20 tablet    Refill:  0    Order Specific Question:   Supervising Provider    Answer:   BEDSOLE, AMY E [[id number]]    I discussed the assessment and treatment plan with the patient. The patient was provided an opportunity to ask questions and all were answered. The patient agreed with the plan and demonstrated an understanding of the instructions.   The patient was advised to call back or seek an in-person evaluation if the symptoms worsen or if the condition fails to improve as anticipated.  I provided 22 minutes of face-to-face time during this encounter.   Eugenia Pancoast, [place] at [facility] [phone] (phone) [phone] (fax)  [place]

## 2021-11-04 NOTE — Assessment & Plan Note (Signed)
D/w pt noncompliance, advised pt I will refill metformin but need to f/u in office for [facility] and we will draw lab work to decide how to treat further as has been out of ozempic x two months and insulin x 2 months. Only taking metformin here and there. Work on a diabetic diet, maintain glucose log, and schedule f/u in two weeks. Pt agrees to plan.

## 2021-12-19 ENCOUNTER — Other Ambulatory Visit: Payer: Self-pay | Admitting: Internal Medicine

## 2021-12-21 NOTE — Telephone Encounter (Signed)
Name of Medication: Sildenafil 50 mg Name of Pharmacy: [facility] or Written Date and Quantity: # 10 x 2 on 11/17/20 Last Office Visit and Type: 11/04/21 acute VV and 11/17/20 FU Next Office Visit and Type: none scheduled  Sending note to [place]

## 2022-01-03 ENCOUNTER — Other Ambulatory Visit: Payer: Self-pay | Admitting: Internal Medicine

## 2022-01-03 DIAGNOSIS — Z794 Long term (current) use of insulin: Secondary | ICD-10-CM

## 2022-01-03 DIAGNOSIS — E1142 Type 2 diabetes mellitus with diabetic polyneuropathy: Secondary | ICD-10-CM

## 2022-01-21 ENCOUNTER — Telehealth (INDEPENDENT_AMBULATORY_CARE_PROVIDER_SITE_OTHER): Payer: 59 | Admitting: Family

## 2022-01-21 ENCOUNTER — Other Ambulatory Visit: Payer: Self-pay

## 2022-01-21 ENCOUNTER — Encounter: Payer: Self-pay | Admitting: Family

## 2022-01-21 DIAGNOSIS — R946 Abnormal results of thyroid function studies: Secondary | ICD-10-CM

## 2022-01-21 DIAGNOSIS — E559 Vitamin D deficiency, unspecified: Secondary | ICD-10-CM

## 2022-01-21 DIAGNOSIS — Z794 Long term (current) use of insulin: Secondary | ICD-10-CM

## 2022-01-21 DIAGNOSIS — E781 Pure hyperglyceridemia: Secondary | ICD-10-CM

## 2022-01-21 DIAGNOSIS — R5383 Other fatigue: Secondary | ICD-10-CM

## 2022-01-21 DIAGNOSIS — E1142 Type 2 diabetes mellitus with diabetic polyneuropathy: Secondary | ICD-10-CM

## 2022-01-21 DIAGNOSIS — Z91199 Patient's noncompliance with other medical treatment and regimen due to unspecified reason: Secondary | ICD-10-CM

## 2022-01-21 DIAGNOSIS — N529 Male erectile dysfunction, unspecified: Secondary | ICD-10-CM

## 2022-01-21 DIAGNOSIS — K219 Gastro-esophageal reflux disease without esophagitis: Secondary | ICD-10-CM

## 2022-01-21 MED ORDER — PANTOPRAZOLE SODIUM 40 MG PO TBEC: 40.0000 mg | DELAYED_RELEASE_TABLET | Freq: Every day | ORAL | 1 refills | Status: AC

## 2022-01-21 NOTE — Progress Notes (Signed)
Transfer of Care Video Visit  MyChart Video Visit    Virtual Visit via Video Note   This visit type was conducted due to national recommendations for restrictions regarding the COVID-19 Pandemic (e.g. social distancing) in an effort to limit this patient's exposure and mitigate transmission in our community. This patient is at least at moderate risk for complications without adequate follow up. This format is felt to be most appropriate for this patient at this time. Physical exam was limited by quality of the video and audio technology used for the visit. CMA was able to get the patient set up on a video visit.  Patient location: Home. Patient and provider in visit Provider location: Office  I discussed the limitations of evaluation and management by telemedicine and the availability of in person appointments. The patient expressed understanding and agreed to proceed.  Visit Date: 01/21/2022  Today's healthcare provider: Eugenia Pancoast, FNP    Subjective:  Patient ID: Joshua Bean, male    DOB: 1976/06/09  Age: 46 y.o. MRN: [id number]  CC:  Chief Complaint  Patient presents with   Transitions Of Care    [facility], concerns    HPI Joshua Bean is here for a transition of care visit.  Prior provider was: Webb Silversmith, FNP Pt is without acute concerns.   Concerned with Ed: pt states has issues with obtain maintaining erection. Has taking viagra in the past asking if he can have refill on this today.   Dry heaves four times last month twice this month. Sometimes will throw up or just with dry heaving. Not at current. He does also have reflux at times. Takes tums when needed.  DM2: not excessive thirst, not excessive hunger. No longer taking ozempic, ran out. Taking metfornin but thinks it gives him stomach upset. Overdue for labs.  Lab Results  Component Value Date   HGBA1C 10.0 (A) 12/10/2020    Abn thyroid result: states abn in the past never really followed up. Does feel  fatigue at times.   HLD, not taking lipitor anymore, ran out. No recent lipid panel. Last time drawn was very elevated. Dad died of MI age 22 so this puts him at high risk of stroke, CAD.   Lab Results  Component Value Date   CHOL 218 (H) 11/17/2020   HDL 32 (L) 11/17/2020   LDLCALC 89 11/17/2020   TRIG 588 (HH) 11/17/2020   CHOLHDL 6.8 (H) 11/17/2020      Past Medical History:  Diagnosis Date   Anxiety    Chicken pox    Diabetes mellitus without complication ([place])    Pneumonia    Skin cancer of face     Past Surgical History:  Procedure Laterality Date   BACK SURGERY     BIOPSY  09/09/2020   Procedure: BIOPSY;  Surgeon: Mauri Pole, MD;  Location: [facility];  Service: Endoscopy;;   ESOPHAGOGASTRODUODENOSCOPY (EGD) WITH PROPOFOL N/A 09/09/2020   Procedure: ESOPHAGOGASTRODUODENOSCOPY (EGD) WITH PROPOFOL;  Surgeon: Mauri Pole, MD;  Location: [facility];  Service: Endoscopy;  Laterality: N/A;   Neck surgery      Family History  Problem Relation Age of Onset   Heart disease Mother    Diabetes Mother    Parkinson's disease Mother    Heart disease Father    Diabetes Father    Hypertension Father    Hyperlipidemia Father    Cancer Neg Hx    Stroke Neg Hx     Social History  Socioeconomic History   Marital status: Divorced    Spouse name: Not on file   Number of children: 2   Years of education: Not on file   Highest education level: Not on file  Occupational History   Occupation: Lab tech-mechanic  Tobacco Use   Smoking status: Never   Smokeless tobacco: Current    Types: Chew  Substance and Sexual Activity   Alcohol use: Yes    Alcohol/week: 0.0 standard drinks    Comment: 1-2 times a month, 3 drinks at a time   Drug use: Yes    Types: Marijuana   Sexual activity: Yes  Other Topics Concern   Not on file  Social History Narrative   Not on file   Social Determinants of Health   Financial Resource Strain: Not on file  Food  Insecurity: Not on file  Transportation Needs: Not on file  Physical Activity: Not on file  Stress: Not on file  Social Connections: Not on file  Intimate Partner Violence: Not on file    Outpatient Medications Prior to Visit  Medication Sig Dispense Refill   albuterol (VENTOLIN HFA) 108 (90 Base) MCG/ACT inhaler Inhale 2 puffs into the lungs every 6 (six) hours as needed for wheezing or shortness of breath. 1 each 0   ALPRAZolam (XANAX) 0.5 MG tablet TAKE 1 TABLET BY MOUTH AT BEDTIME AS NEEDED FOR ANXIETY 15 tablet 0   atorvastatin (LIPITOR) 10 MG tablet Take 1 tablet (10 mg total) by mouth daily. 30 tablet 2   fluticasone (FLONASE) 50 MCG/ACT nasal spray Place 2 sprays into both nostrils daily. 9.9 mL 0   insulin glargine (LANTUS) 100 UNIT/ML Solostar Pen Inject 36 Units into the skin at bedtime. 30 mL 3   insulin lispro (HUMALOG) 200 UNIT/ML KwikPen Inject 8-12 Units into the skin 3 (three) times daily before meals. (Patient not taking: Reported on 01/13/2021) 30 mL 3   Insulin Pen Needle (PEN NEEDLES 3/16") 31G X 5 MM MISC Use with insulin pen as directed 100 each 2   Semaglutide,0.25 or 0.'5MG'$ /DOS, (OZEMPIC, 0.25 OR 0.5 MG/DOSE,) 2 MG/1.5ML SOPN Inject 0.5 mg into the skin once a week. (Patient not taking: Reported on 01/13/2021) 4.5 mL 3   sildenafil (VIAGRA) 50 MG tablet TAKE 1 TABLET BY MOUTH EVERY DAY AS NEEDED FOR ERECTILE DYSFUNCTION 10 tablet 2   metFORMIN (GLUCOPHAGE) 1000 MG tablet TAKE 1 TABLET(1000 MG) BY MOUTH TWICE DAILY WITH A MEAL 180 tablet 3   No facility-administered medications prior to visit.    Allergies  Allergen Reactions   Bee Venom Anaphylaxis    ROS Review of Systems  Constitutional:  Positive for fatigue. Negative for chills, fever and unexpected weight change.  Eyes:  Negative for visual disturbance.  Respiratory:  Negative for shortness of breath.   Cardiovascular:  Negative for chest pain.  Gastrointestinal:  Positive for nausea (with acid reflux and  dry heaves at times) and vomiting. Negative for abdominal pain.  Endocrine: Negative for cold intolerance, heat intolerance, polydipsia, polyphagia and polyuria.  Genitourinary:  Negative for difficulty urinating, dysuria, penile discharge, penile pain, scrotal swelling, testicular pain and urgency.       Erectile dysfunction   Skin:  Negative for rash.  Neurological:  Negative for dizziness and headaches.       Objective:    Physical Exam Constitutional:      Appearance: Normal appearance. He is obese.  Pulmonary:     Effort: Pulmonary effort is normal.  Neurological:  General: No focal deficit present.     Mental Status: He is alert and oriented to person, place, and time.  Psychiatric:        Mood and Affect: Mood normal.        Behavior: Behavior normal.        Thought Content: Thought content normal.        Judgment: Judgment normal.     There were no vitals taken for this visit. Wt Readings from Last 3 Encounters:  11/04/21 205 lb (93 kg)  01/13/21 204 lb (92.5 kg)  12/10/20 204 lb 3.2 oz (92.6 kg)     Health Maintenance Due  Topic Date Due   COVID-19 Vaccine (1) Never done   FOOT EXAM  Never done   OPHTHALMOLOGY EXAM  Never done   Hepatitis C Screening  Never done   TETANUS/TDAP  Never done   URINE MICROALBUMIN  06/04/2021   HEMOGLOBIN A1C  06/09/2021   INFLUENZA VACCINE  06/14/2021   COLONOSCOPY (Pts 45-35yr Insurance coverage will need to be confirmed)  Never done    There are no preventive care reminders to display for this patient.  Lab Results  Component Value Date   TSH 0.672 09/28/2020   Lab Results  Component Value Date   WBC 6.9 09/28/2020   HGB 14.7 09/28/2020   HCT 43.3 09/28/2020   MCV 91 09/28/2020   PLT 345 09/28/2020   Lab Results  Component Value Date   NA 137 09/28/2020   K 5.0 09/28/2020   CO2 20 09/28/2020   GLUCOSE 199 (H) 09/28/2020   BUN 18 09/28/2020   CREATININE 0.90 09/28/2020   BILITOT 0.2 09/28/2020    ALKPHOS 89 09/28/2020   AST 22 09/28/2020   ALT 17 09/28/2020   PROT 6.9 09/28/2020   ALBUMIN 4.6 09/28/2020   CALCIUM 9.6 09/28/2020   ANIONGAP 9 09/11/2020   Lab Results  Component Value Date   CHOL 218 (H) 11/17/2020   Lab Results  Component Value Date   HDL 32 (L) 11/17/2020   Lab Results  Component Value Date   LDLCALC 89 11/17/2020   Lab Results  Component Value Date   TRIG 588 ([place] 11/17/2020   Lab Results  Component Value Date   CHOLHDL 6.8 (H) 11/17/2020   Lab Results  Component Value Date   HGBA1C 10.0 (A) 12/10/2020      Assessment & Plan:   Problem List Items Addressed This Visit       Digestive   Gastroesophageal reflux disease    Starting pt on pantoprazole  Try to decrease and or avoid spicy foods, fried fatty foods, and also caffeine and chocolate as these can increase heartburn symptoms.        Relevant Medications   pantoprazole (PROTONIX) 40 MG tablet     Endocrine   Diabetes [place]    Still noncompliant was supposed to f/u over two months ago.  Stressed need to order a1c and assess kidney function.  Pt to come in office in next five days, scheduled for Wednesday. We will continue metformin for now pending a1c . May consider SU, SGLT2 and or GLP DPP4 depending on numbers. Pt not checking glucose that often at home.  Ordered hga1c today pending results. Work on diabetic diet and exercise as tolerated. Yearly foot exam, and annual eye exam.        Relevant Orders   Hemoglobin A1c   CBC with Differential/Platelet   Comprehensive metabolic panel  Other   HLD (hyperlipidemia)    Noncompliant.  Ordered lipid panel, pending results. Work on low cholesterol diet and exercise as tolerated       Relevant Orders   Lipid panel   Erectile dysfunction    Ordering testosterone.  Pt to refer to urology.  Pt requested cialis holding for now as not sure of blood pressure or status       Relevant Orders   Testosterone  Total,Free,Bio, Males   Vitamin B12   Medically noncompliant    Again d/w pt compliance and need to comply with medication regimen and labs.       Vitamin D deficiency    Vit d ordered pending results.      Relevant Orders   VITAMIN D 25 Hydroxy (Vit-D Deficiency, Fractures)   Abnormal thyroid function test    Ordering TSH pending results.      Relevant Orders   TSH   RESOLVED: Other fatigue   Relevant Orders   Testosterone Total,Free,Bio, Males   Vitamin B12    Meds ordered this encounter  Medications   pantoprazole (PROTONIX) 40 MG tablet    Sig: Take 1 tablet (40 mg total) by mouth daily.    Dispense:  30 tablet    Refill:  1    Order Specific Question:   Supervising Provider    Answer:   BEDSOLE, AMY E [[id number]]    Follow-up: No follow-ups on file.    Eugenia Pancoast, FNP

## 2022-01-23 DIAGNOSIS — R5383 Other fatigue: Secondary | ICD-10-CM | POA: Insufficient documentation

## 2022-01-23 DIAGNOSIS — E559 Vitamin D deficiency, unspecified: Secondary | ICD-10-CM | POA: Insufficient documentation

## 2022-01-23 DIAGNOSIS — R946 Abnormal results of thyroid function studies: Secondary | ICD-10-CM | POA: Insufficient documentation

## 2022-01-23 DIAGNOSIS — K219 Gastro-esophageal reflux disease without esophagitis: Secondary | ICD-10-CM | POA: Insufficient documentation

## 2022-01-23 NOTE — Assessment & Plan Note (Signed)
Starting pt on pantoprazole  ?Try to decrease and or avoid spicy foods, fried fatty foods, and also caffeine and chocolate as these can increase heartburn symptoms.  ? ?

## 2022-01-23 NOTE — Assessment & Plan Note (Signed)
Ordering TSH pending results. ?

## 2022-01-23 NOTE — Assessment & Plan Note (Signed)
Noncompliant.  ?Ordered lipid panel, pending results. Work on low cholesterol diet and exercise as tolerated ? ?

## 2022-01-23 NOTE — Assessment & Plan Note (Signed)
Again d/w pt compliance and need to comply with medication regimen and labs.  ?

## 2022-01-23 NOTE — Assessment & Plan Note (Signed)
Ordering testosterone.  ?Pt to refer to urology.  ?Pt requested cialis holding for now as not sure of blood pressure or status  ?

## 2022-01-23 NOTE — Assessment & Plan Note (Signed)
Still noncompliant was supposed to f/u over two months ago.  ?Stressed need to order a1c and assess kidney function.  ?Pt to come in office in next five days, scheduled for Wednesday. We will continue metformin for now pending a1c . May consider SU, SGLT2 and or GLP DPP4 depending on numbers. Pt not checking glucose that often at home.  ?Ordered hga1c today pending results. Work on diabetic diet and exercise as tolerated. Yearly foot exam, and annual eye exam.  ? ?

## 2022-01-23 NOTE — Assessment & Plan Note (Signed)
Vit d ordered pending results. ?

## 2022-01-26 ENCOUNTER — Ambulatory Visit: Payer: Self-pay | Admitting: Family

## 2022-01-27 ENCOUNTER — Ambulatory Visit: Admission: RE | Admit: 2022-01-27 | Payer: Self-pay | Source: Ambulatory Visit

## 2022-01-27 ENCOUNTER — Other Ambulatory Visit: Payer: Self-pay

## 2022-01-27 ENCOUNTER — Telehealth: Payer: Self-pay

## 2022-01-27 ENCOUNTER — Encounter: Payer: Self-pay | Admitting: Family

## 2022-01-27 ENCOUNTER — Ambulatory Visit (INDEPENDENT_AMBULATORY_CARE_PROVIDER_SITE_OTHER): Payer: Managed Care, Other (non HMO) | Admitting: Family

## 2022-01-27 VITALS — BP 116/82 | HR 97 | Ht 71.0 in | Wt 210.0 lb

## 2022-01-27 DIAGNOSIS — E1142 Type 2 diabetes mellitus with diabetic polyneuropathy: Secondary | ICD-10-CM | POA: Diagnosis not present

## 2022-01-27 DIAGNOSIS — N529 Male erectile dysfunction, unspecified: Secondary | ICD-10-CM | POA: Diagnosis not present

## 2022-01-27 DIAGNOSIS — Z91199 Patient's noncompliance with other medical treatment and regimen due to unspecified reason: Secondary | ICD-10-CM

## 2022-01-27 DIAGNOSIS — R1031 Right lower quadrant pain: Secondary | ICD-10-CM | POA: Diagnosis not present

## 2022-01-27 DIAGNOSIS — K219 Gastro-esophageal reflux disease without esophagitis: Secondary | ICD-10-CM

## 2022-01-27 DIAGNOSIS — Z794 Long term (current) use of insulin: Secondary | ICD-10-CM | POA: Diagnosis not present

## 2022-01-27 DIAGNOSIS — E781 Pure hyperglyceridemia: Secondary | ICD-10-CM

## 2022-01-27 DIAGNOSIS — R946 Abnormal results of thyroid function studies: Secondary | ICD-10-CM

## 2022-01-27 DIAGNOSIS — E559 Vitamin D deficiency, unspecified: Secondary | ICD-10-CM

## 2022-01-27 DIAGNOSIS — R5383 Other fatigue: Secondary | ICD-10-CM | POA: Diagnosis not present

## 2022-01-27 LAB — POCT GLYCOSYLATED HEMOGLOBIN (HGB A1C): Hemoglobin A1C: 8.7 % — AB (ref 4.0–5.6)

## 2022-01-27 MED ORDER — METFORMIN HCL ER 750 MG PO TB24: 750.0000 mg | ORAL_TABLET | Freq: Every day | ORAL | 1 refills | Status: AC

## 2022-01-27 NOTE — Telephone Encounter (Signed)
Patient called back stating that he was sent over for CT scan this morning and when he got there he was told his insurance is not covering his scan so he left. He wanted to [place] know. Not sure of what happened. Please let patient know how to proceed. ?CC Sherri who helped looks like per stat chat to work on this. Thank you ?

## 2022-01-27 NOTE — Assessment & Plan Note (Signed)
Reminded pt I sent in rx for pantoprazole 40 mg once daily.  ?Pt to start to see if improvement/relief of symptoms.  ?Try to decrease and or avoid spicy foods, fried fatty foods, and also caffeine and chocolate as these can increase heartburn symptoms.  ? ?

## 2022-01-27 NOTE — Patient Instructions (Addendum)
I sent in Metformin XR 750 mg once daily. You will start this and stop the metformin 1000 mg once daily. You may tolerate this better.  ? ?Please change: ?- Metformin 750 mg po qam and see if GI upset improves.   ?- Lantus 36 units at bedtime ?  ?Please start ?- Humalog 6-10 units 3x a day, 15 min before meals ?  ?Please start: ?- Ozempic 0.25 mg weekly in a.m. (for example on Sunday morning) x 4 weeks, then increase to 0.5 mg weekly in a.m. if no nausea or hypoglycemia. ? ?A referral was placed today for endocrinology. ?Please let us know if you have not heard back within 1 week about your referral. ? ?I also sent pantoprazole last visit for your epigastric tenderness, please start taking this to see if any improvement.  ? ?It was a pleasure seeing you today! Please do not hesitate to reach out with any questions and or concerns. ? ?Regards,  ? ?Buffy Ehler ?FNP-C ? ? ? ? ? ? ? ? ?

## 2022-01-27 NOTE — Progress Notes (Signed)
? ?Established Patient Office Visit ? ?Subjective:  ?Patient ID: Joshua Bean, male    DOB: [birth year]  Age: 46 y.o. MRN: [id number] ? ?CC:  ?Chief Complaint  ?Patient presents with  ? Diabetes  ? ? ?HPI ?[name] is here today for follow up.  ?Pt is without acute concerns. ? ?DM type 2:  ?Stopped taking lantus at night states was dropping too low throughout tthe day. He is now taking the insulin humolog, 6-7 units about two hours after meals not before, and he does this sporadically. He finds that his glucose is about 130 prior to eating and then spikes to 500 after eating. Had been placed on ozempic but never started taking it. He was seeing endocrinology but didn't like the office so did not follow up.  ? Today's a1c in office is 8.7 ? ?Epigastric tenderness, has improved slightly with decreasing the metformin as was causing a lot of gI upset. He was experiencing gagging with dry heaves as well as ruq intermittent pain with some mild nausea. Pt does state he gets a lot of pain between the shoulder blades, states this is where he holds his breath.  ? ?Past Medical History:  ?Diagnosis Date  ? Anxiety   ? Chicken pox   ? Diabetes mellitus without complication ([place])   ? Pneumonia   ? Skin cancer of face   ? ? ?Past Surgical History:  ?Procedure Laterality Date  ? BACK SURGERY    ? BIOPSY  09/09/2020  ? Procedure: BIOPSY;  Surgeon: Mauri Pole, MD;  Location: [facility];  Service: Endoscopy;;  ? ESOPHAGOGASTRODUODENOSCOPY (EGD) WITH PROPOFOL N/A 09/09/2020  ? Procedure: ESOPHAGOGASTRODUODENOSCOPY (EGD) WITH PROPOFOL;  Surgeon: Mauri Pole, MD;  Location: [facility];  Service: Endoscopy;  Laterality: N/A;  ? Neck surgery    ? ? ?Family History  ?Problem Relation Age of Onset  ? Heart disease Mother   ? Diabetes Mother   ? Parkinson's disease Mother   ? Heart disease Father   ? Diabetes Father   ? Hypertension Father   ? Hyperlipidemia Father   ? Cancer Neg Hx   ? Stroke Neg Hx   ? ? ?Social  History  ? ?Socioeconomic History  ? Marital status: Divorced  ?  Spouse name: Not on file  ? Number of children: 2  ? Years of education: Not on file  ? Highest education level: Not on file  ?Occupational History  ? Occupation: Lab tech-mechanic  ?Tobacco Use  ? Smoking status: Never  ? Smokeless tobacco: Current  ?  Types: Chew  ?Substance and Sexual Activity  ? Alcohol use: Yes  ?  Alcohol/week: 0.0 standard drinks  ?  Comment: 1-2 times a month, 3 drinks at a time  ? Drug use: Yes  ?  Types: Marijuana  ? Sexual activity: Yes  ?Other Topics Concern  ? Not on file  ?Social History Narrative  ? Not on file  ? ?Social Determinants of Health  ? ?Financial Resource Strain: Not on file  ?Food Insecurity: Not on file  ?Transportation Needs: Not on file  ?Physical Activity: Not on file  ?Stress: Not on file  ?Social Connections: Not on file  ?Intimate Partner Violence: Not on file  ? ? ?Outpatient Medications Prior to Visit  ?Medication Sig Dispense Refill  ? albuterol (VENTOLIN HFA) 108 (90 Base) MCG/ACT inhaler Inhale 2 puffs into the lungs every 6 (six) hours as needed for wheezing or shortness of breath. 1 each  0  ? ALPRAZolam (XANAX) 0.5 MG tablet TAKE 1 TABLET BY MOUTH AT BEDTIME AS NEEDED FOR ANXIETY 15 tablet 0  ? atorvastatin (LIPITOR) 10 MG tablet Take 1 tablet (10 mg total) by mouth daily. 30 tablet 2  ? fluticasone (FLONASE) 50 MCG/ACT nasal spray Place 2 sprays into both nostrils daily. 9.9 mL 0  ? Insulin Pen Needle (PEN NEEDLES 3/16") 31G X 5 MM MISC Use with insulin pen as directed 100 each 2  ? pantoprazole (PROTONIX) 40 MG tablet Take 1 tablet (40 mg total) by mouth daily. 30 tablet 1  ? sildenafil (VIAGRA) 50 MG tablet TAKE 1 TABLET BY MOUTH EVERY DAY AS NEEDED FOR ERECTILE DYSFUNCTION 10 tablet 2  ? insulin glargine (LANTUS) 100 UNIT/ML Solostar Pen Inject 36 Units into the skin at bedtime. 30 mL 3  ? insulin lispro (HUMALOG) 200 UNIT/ML KwikPen Inject 8-12 Units into the skin 3 (three) times daily  before meals. (Patient not taking: Reported on 01/13/2021) 30 mL 3  ? Semaglutide,0.25 or 0.'5MG'$ /DOS, (OZEMPIC, 0.25 OR 0.5 MG/DOSE,) 2 MG/1.5ML SOPN Inject 0.5 mg into the skin once a week. (Patient not taking: Reported on 01/27/2022) 4.5 mL 3  ? ?No facility-administered medications prior to visit.  ? ? ?Allergies  ?Allergen Reactions  ? Bee Venom Anaphylaxis  ? ? ?ROS ?Review of Systems  ?Constitutional:  Positive for fatigue. Negative for chills, fever and unexpected weight change.  ?Eyes:  Negative for visual disturbance.  ?Respiratory:  Negative for cough, shortness of breath and wheezing.   ?Cardiovascular:  Negative for chest pain and palpitations.  ?Gastrointestinal:  Positive for abdominal pain (epigastric tenderness on and off, some ruq abd pain), diarrhea and nausea (has improved with decrease in metformin). Negative for blood in stool, constipation and vomiting (was dry heaving last week, has begun to resolve with decrease metformin).  ?     Heartburn with increasing burping  ?Endocrine: Negative for cold intolerance, heat intolerance, polydipsia, polyphagia and polyuria.  ?Genitourinary:  Negative for difficulty urinating.  ?Musculoskeletal:  Positive for arthralgias (pain in between shoulder blades intermittent).  ?Skin:  Negative for rash.  ?Neurological:  Negative for dizziness and headaches.  ? ?Review of Systems  ?Respiratory:  Negative for shortness of breath.   ?Cardiovascular:  Negative for chest pain and palpitations.  ?Gastrointestinal:  Negative for constipation and diarrhea.  ?Genitourinary:  Negative for dysuria, frequency and urgency.  ?Musculoskeletal:  Negative for myalgias.  ?Psychiatric/Behavioral:  Negative for depression and suicidal ideas.   ?All other systems reviewed and are negative. ? ?  ?Objective:  ?  ?Physical Exam ?Constitutional:   ?   General: He is awake. He is not in acute distress. ?   Appearance: Normal appearance. He is obese. He is not ill-appearing, toxic-appearing or  diaphoretic.  ?HENT:  ?   Head: Normocephalic.  ?   Nose:  ?   Right Turbinates: Not enlarged or swollen.  ?   Left Turbinates: Not enlarged or swollen.  ?   Right Sinus: No maxillary sinus tenderness or frontal sinus tenderness.  ?   Left Sinus: No maxillary sinus tenderness or frontal sinus tenderness.  ?   Mouth/Throat:  ?   Pharynx: No pharyngeal swelling, oropharyngeal exudate or posterior oropharyngeal erythema.  ?Cardiovascular:  ?   Rate and Rhythm: Normal rate and regular rhythm.  ?Pulmonary:  ?   Effort: Pulmonary effort is normal.  ?   Breath sounds: Normal breath sounds. No wheezing.  ?Abdominal:  ?  General: There is no distension.  ?   Palpations: There is no mass.  ?   Tenderness: There is abdominal tenderness (ruq moderate). There is rebound. There is no guarding.  ?   Hernia: No hernia is present.  ?Skin: ?   General: Skin is warm.  ?Neurological:  ?   General: No focal deficit present.  ?   Mental Status: He is alert and oriented to person, place, and time.  ?Psychiatric:     ?   Mood and Affect: Mood normal.     ?   Behavior: Behavior normal.     ?   Thought Content: Thought content normal.     ?   Judgment: Judgment normal.  ? ? ? ?BP 116/82   Pulse 97   Ht '5\' 11"'$  (1.803 m)   Wt 210 lb (95.3 kg)   SpO2 98%   BMI 29.29 kg/m?  ?Wt Readings from Last 3 Encounters:  ?01/27/22 210 lb (95.3 kg)  ?11/04/21 205 lb (93 kg)  ?01/13/21 204 lb (92.5 kg)  ? ? ? ?Health Maintenance Due  ?Topic Date Due  ? COVID-19 Vaccine (1) Never done  ? FOOT EXAM  Never done  ? OPHTHALMOLOGY EXAM  Never done  ? Hepatitis C Screening  Never done  ? TETANUS/TDAP  Never done  ? URINE MICROALBUMIN  06/04/2021  ? INFLUENZA VACCINE  06/14/2021  ? COLONOSCOPY (Pts 45-67yr Insurance coverage will need to be confirmed)  Never done  ? ? ?There are no preventive care reminders to display for this patient. ? ?Lab Results  ?Component Value Date  ? TSH 0.672 09/28/2020  ? ?Lab Results  ?Component Value Date  ? WBC 6.9 09/28/2020   ? HGB 14.7 09/28/2020  ? HCT 43.3 09/28/2020  ? MCV 91 09/28/2020  ? PLT 345 09/28/2020  ? ?Lab Results  ?Component Value Date  ? NA 137 09/28/2020  ? K 5.0 09/28/2020  ? CO2 20 09/28/2020  ? GLUCOSE 199 (H)

## 2022-01-27 NOTE — Assessment & Plan Note (Signed)
a1c poct ordered and reviewed today in office.  ?Change to metformin XR 750 mg po qd  ?

## 2022-01-27 NOTE — Progress Notes (Signed)
Reviewed in office and d/w pt.

## 2022-01-28 ENCOUNTER — Ambulatory Visit
Admission: RE | Admit: 2022-01-28 | Discharge: 2022-01-28 | Disposition: A | Discharge status: Home / Self Care | Payer: Managed Care, Other (non HMO) | Source: Ambulatory Visit | Attending: Family | Admitting: Family

## 2022-01-28 ENCOUNTER — Other Ambulatory Visit: Payer: Self-pay | Admitting: Family

## 2022-01-28 DIAGNOSIS — E782 Mixed hyperlipidemia: Secondary | ICD-10-CM

## 2022-01-28 DIAGNOSIS — E781 Pure hyperglyceridemia: Secondary | ICD-10-CM | POA: Insufficient documentation

## 2022-01-28 DIAGNOSIS — E559 Vitamin D deficiency, unspecified: Secondary | ICD-10-CM

## 2022-01-28 DIAGNOSIS — R1031 Right lower quadrant pain: Secondary | ICD-10-CM | POA: Insufficient documentation

## 2022-01-28 LAB — COMPREHENSIVE METABOLIC PANEL
ALT: 19 IU/L (ref 0–44)
AST: 14 IU/L (ref 0–40)
Albumin/Globulin Ratio: 2.2 (ref 1.2–2.2)
Albumin: 4.6 g/dL (ref 4.0–5.0)
Alkaline Phosphatase: 77 IU/L (ref 44–121)
BUN/Creatinine Ratio: 15 (ref 9–20)
BUN: 12 mg/dL (ref 6–24)
Bilirubin Total: 0.4 mg/dL (ref 0.0–1.2)
CO2: 24 mmol/L (ref 20–29)
Calcium: 9.7 mg/dL (ref 8.7–10.2)
Chloride: 101 mmol/L (ref 96–106)
Creatinine, Ser: 0.79 mg/dL (ref 0.76–1.27)
Globulin, Total: 2.1 g/dL (ref 1.5–4.5)
Glucose: 219 mg/dL — ABNORMAL HIGH (ref 70–99)
Potassium: 4.6 mmol/L (ref 3.5–5.2)
Sodium: 137 mmol/L (ref 134–144)
Total Protein: 6.7 g/dL (ref 6.0–8.5)
eGFR: 112 mL/min/{1.73_m2} (ref >59)

## 2022-01-28 LAB — TESTOSTERONE TOTAL,FREE,BIO, MALES
Albumin: 4.4 g/dL (ref 3.6–5.1)
Sex Hormone Binding: 14 nmol/L (ref 10–50)
Testosterone, Bioavailable: 157.1 ng/dL (ref 110.0–575.0)
Testosterone, Free: 78 pg/mL (ref 46.0–224.0)
Testosterone: 329 ng/dL (ref 250–827)

## 2022-01-28 LAB — CBC WITH DIFFERENTIAL/PLATELET
Basophils Absolute: 0.1 10*3/uL (ref 0.0–0.2)
Basos: 1 %
EOS (ABSOLUTE): 0.1 10*3/uL (ref 0.0–0.4)
Eos: 2 %
Hematocrit: 47.5 % (ref 37.5–51.0)
Hemoglobin: 16.5 g/dL (ref 13.0–17.7)
Immature Grans (Abs): 0 10*3/uL (ref 0.0–0.1)
Immature Granulocytes: 1 %
Lymphocytes Absolute: 1.8 10*3/uL (ref 0.7–3.1)
Lymphs: 36 %
MCH: 32.5 pg (ref 26.6–33.0)
MCHC: 34.7 g/dL (ref 31.5–35.7)
MCV: 94 fL (ref 79–97)
Monocytes Absolute: 0.6 10*3/uL (ref 0.1–0.9)
Monocytes: 11 %
Neutrophils Absolute: 2.5 10*3/uL (ref 1.4–7.0)
Neutrophils: 49 %
Platelets: 240 10*3/uL (ref 150–450)
RBC: 5.08 x10E6/uL (ref 4.14–5.80)
RDW: 12.7 % (ref 11.6–15.4)
WBC: 5.1 10*3/uL (ref 3.4–10.8)

## 2022-01-28 LAB — LIPID PANEL
Chol/HDL Ratio: 6.4 ratio — ABNORMAL HIGH (ref 0.0–5.0)
Cholesterol, Total: 204 mg/dL — ABNORMAL HIGH (ref 100–199)
HDL: 32 mg/dL — ABNORMAL LOW (ref >39)
LDL Chol Calc (NIH): 64 mg/dL (ref 0–99)
Triglycerides: 714 mg/dL (ref 0–149)
VLDL Cholesterol Cal: 108 mg/dL — ABNORMAL HIGH (ref 5–40)

## 2022-01-28 LAB — HEMOGLOBIN A1C
Est. average glucose Bld gHb Est-mCnc: 186 mg/dL
Hgb A1c MFr Bld: 8.1 % — ABNORMAL HIGH (ref 4.8–5.6)

## 2022-01-28 LAB — VITAMIN D 25 HYDROXY (VIT D DEFICIENCY, FRACTURES): Vit D, 25-Hydroxy: 8.3 ng/mL — ABNORMAL LOW (ref 30.0–100.0)

## 2022-01-28 LAB — VITAMIN B12: Vitamin B-12: 310 pg/mL (ref 232–1245)

## 2022-01-28 LAB — MICROALBUMIN / CREATININE URINE RATIO
Creatinine, Urine: 152.6 mg/dL
Microalb/Creat Ratio: 15 mg/g creat (ref 0–29)
Microalbumin, Urine: 22.5 ug/mL

## 2022-01-28 LAB — TSH: TSH: 0.68 u[IU]/mL (ref 0.450–4.500)

## 2022-01-28 MED ORDER — ICOSAPENT ETHYL 1 G PO CAPS: 2.0000 g | ORAL_CAPSULE | Freq: Two times a day (BID) | ORAL | 1 refills | Status: DC

## 2022-01-28 MED ORDER — ATORVASTATIN CALCIUM 20 MG PO TABS: 20.0000 mg | ORAL_TABLET | Freq: Every day | ORAL | 1 refills | Status: DC

## 2022-01-28 MED ORDER — IOHEXOL 300 MG/ML  SOLN
100.0000 mL | Freq: Once | INTRAMUSCULAR | Status: AC | PRN
  Administered 2022-01-28: 100 mL via INTRAVENOUS

## 2022-01-28 MED ORDER — VITAMIN D (ERGOCALCIFEROL) 1.25 MG (50000 UNIT) PO CAPS: 50000.0000 [IU] | ORAL_CAPSULE | ORAL | 0 refills | Status: AC

## 2022-01-28 NOTE — Assessment & Plan Note (Signed)
Moderate pain on palpation of right upper quadrant abdomen as well as slight tenderness on the epigastric area.  Due to other symptoms involving shoulder blade pain as well as burping often and some dry heaving I am ordering a stat CT of the abdomen and pelvis to rule out any cholelithiasis or cholecystitis and or other etiology ?

## 2022-01-28 NOTE — Assessment & Plan Note (Signed)
Vitamin D ordered today pending results ?

## 2022-01-28 NOTE — Assessment & Plan Note (Signed)
Patient is willing to be compliant we will work together and advocate as the patient provider relationship.  Lab work ordered today patient has come on time for visit and pending results to work on plan together ?

## 2022-01-28 NOTE — Progress Notes (Signed)
Not sure who is in lab today, is there enough blood to possibly order amylase and lipase?

## 2022-01-28 NOTE — Assessment & Plan Note (Signed)
Lipid panel ordered today pending results was very high last time may have to consider medication management.  Pending results work on low-cholesterol diet and exercise as tolerated ?

## 2022-01-28 NOTE — Assessment & Plan Note (Signed)
Work-up for fatigue ordered today with lab work pending results ?

## 2022-01-28 NOTE — Assessment & Plan Note (Signed)
History of abnormal thyroid dysfunction ordering thyroid panel today pending results ?

## 2022-01-31 NOTE — Progress Notes (Signed)
Your CT abdomen came back negative for acute findings, which is good news.

## 2022-02-02 ENCOUNTER — Telehealth: Payer: Self-pay | Admitting: *Deleted

## 2022-02-02 LAB — SPECIMEN STATUS REPORT

## 2022-02-02 LAB — AMYLASE: Amylase: 54 U/L (ref 31–110)

## 2022-02-02 LAB — LIPASE: Lipase: 33 U/L (ref 13–78)

## 2022-02-02 NOTE — Telephone Encounter (Signed)
Notified pt--Vascepa 1 gm capsule been approved  02/02/22-02/03/23. Faxed the approval to the pharmacy. ? ?Pt having burning sensation lower legs/upper legs. Please advise. ? ? ?

## 2022-02-02 NOTE — Progress Notes (Signed)
Amylase lipase is within acceptable range ? ?

## 2022-02-03 NOTE — Telephone Encounter (Signed)
Pt stated--have warm sensation and sore sometimes, but will schedule appt if  it get worse. ?

## 2022-02-10 ENCOUNTER — Encounter: Payer: Self-pay | Admitting: Family

## 2022-03-14 ENCOUNTER — Telehealth: Payer: Self-pay

## 2022-03-14 NOTE — Telephone Encounter (Signed)
Pt states he received a bill from [facility] for $385 for labs done on 01-27-22. When I pull up the labs, they say performed by [facility]. He is a [employer]. [facility] bill #[id number] ?

## 2022-03-14 NOTE — Telephone Encounter (Signed)
Spoke to the patient, he will bring his bill in for Joshua Bean in the laboratory. One test was done at [facility] in error ?

## 2022-06-09 ENCOUNTER — Ambulatory Visit: Payer: Self-pay | Admitting: Family

## 2022-06-09 NOTE — Telephone Encounter (Signed)
Girlfriend calling in.   Aurora Mask calling in.  She is not on his DPR.  Pt is not a established pt with Webb Silversmith, NP.   He used to see her at [facility].   He is established with a new NP at another practice.  Carmell Austria saying,  "He is really stubborn".   "He really needs to come back and see her".   I let her know he would need to call and make an appt and that we would be glad to get him re-established as a pt with Webb Silversmith.   She again said,   "He is so stubborn".   "That's why I'm calling for him".    "I know you can't tell me anything due to HIPPA but he really needs call himself"   So she said she would stay on him and get him to call in and make an appt.

## 2022-07-06 ENCOUNTER — Telehealth: Payer: Self-pay

## 2022-07-06 NOTE — Telephone Encounter (Unsigned)
Copied from Justin [phone]. Topic: Appointment Scheduling - Scheduling Inquiry for Clinic >> Jun 27, 2022 11:57 AM Penni Bombard wrote: Reason for CRM: Pt called saying he was a patient at [facility] and has not seen Rollene Fare since but would like to come and see her in [place].  She took care of his diabetes meds  CA@  [phone]. >> Jul 05, 2022 11:10 AM Renato Gails wrote: Please get approval from [facility]

## 2022-07-07 NOTE — Telephone Encounter (Signed)
That is fine but he will need to be put in the next new patient appointment

## 2022-07-12 NOTE — Telephone Encounter (Signed)
Tried to call this patient back multiple times and phone was not working.  If pt calls back he can make a new pt appt.

## 2022-11-01 ENCOUNTER — Telehealth: Payer: Self-pay

## 2022-11-01 DIAGNOSIS — E781 Pure hyperglyceridemia: Secondary | ICD-10-CM

## 2022-11-01 DIAGNOSIS — E782 Mixed hyperlipidemia: Secondary | ICD-10-CM

## 2022-11-02 MED ORDER — ATORVASTATIN CALCIUM 20 MG PO TABS: 20.0000 mg | ORAL_TABLET | Freq: Every day | ORAL | 0 refills | Status: DC

## 2022-11-02 NOTE — Telephone Encounter (Signed)
Refills sent to pharmacy. 

## 2022-11-02 NOTE — Addendum Note (Signed)
Addended by: Pleas Koch on: 11/02/2022 02:10 PM   Modules accepted: Orders

## 2023-01-24 ENCOUNTER — Ambulatory Visit: Payer: 59 | Admitting: Internal Medicine

## 2023-01-24 ENCOUNTER — Encounter: Payer: Self-pay | Admitting: Internal Medicine

## 2023-01-24 VITALS — BP 134/82 | HR 100 | Temp 96.8°F | Ht 71.5 in | Wt 207.0 lb

## 2023-01-24 DIAGNOSIS — E1165 Type 2 diabetes mellitus with hyperglycemia: Secondary | ICD-10-CM

## 2023-01-24 DIAGNOSIS — F419 Anxiety disorder, unspecified: Secondary | ICD-10-CM

## 2023-01-24 DIAGNOSIS — E1142 Type 2 diabetes mellitus with diabetic polyneuropathy: Secondary | ICD-10-CM

## 2023-01-24 DIAGNOSIS — Z1159 Encounter for screening for other viral diseases: Secondary | ICD-10-CM | POA: Diagnosis not present

## 2023-01-24 DIAGNOSIS — Z1211 Encounter for screening for malignant neoplasm of colon: Secondary | ICD-10-CM | POA: Diagnosis not present

## 2023-01-24 DIAGNOSIS — Z23 Encounter for immunization: Secondary | ICD-10-CM

## 2023-01-24 DIAGNOSIS — N528 Other male erectile dysfunction: Secondary | ICD-10-CM

## 2023-01-24 DIAGNOSIS — G4700 Insomnia, unspecified: Secondary | ICD-10-CM | POA: Insufficient documentation

## 2023-01-24 DIAGNOSIS — F5101 Primary insomnia: Secondary | ICD-10-CM

## 2023-01-24 DIAGNOSIS — F32A Depression, unspecified: Secondary | ICD-10-CM

## 2023-01-24 DIAGNOSIS — Z794 Long term (current) use of insulin: Secondary | ICD-10-CM

## 2023-01-24 DIAGNOSIS — Z6828 Body mass index (BMI) 28.0-28.9, adult: Secondary | ICD-10-CM

## 2023-01-24 DIAGNOSIS — K219 Gastro-esophageal reflux disease without esophagitis: Secondary | ICD-10-CM

## 2023-01-24 DIAGNOSIS — E663 Overweight: Secondary | ICD-10-CM

## 2023-01-24 MED ORDER — AZELASTINE HCL 0.1 % NA SOLN: 1.0000 | Freq: Two times a day (BID) | NASAL | 5 refills | Status: AC

## 2023-01-24 MED ORDER — MONTELUKAST SODIUM 10 MG PO TABS: 10.0000 mg | ORAL_TABLET | Freq: Every day | ORAL | 0 refills | Status: AC

## 2023-01-24 NOTE — Assessment & Plan Note (Signed)
C-Met and lipid profile today Encouraged him to consume low-fat diet Continue atorvastatin 

## 2023-01-24 NOTE — Addendum Note (Signed)
Addended by: Ashley Royalty E on: 01/24/2023 04:18 PM   Modules accepted: Orders

## 2023-01-24 NOTE — Assessment & Plan Note (Signed)
Avoid foods that trigger reflux Continue pantoprazole

## 2023-01-24 NOTE — Assessment & Plan Note (Signed)
A1c and urine microalbumin today Encouraged him to consume a low-carb diet and exercise for weight loss Continue Lantus, metformin and Ozempic Encourage routine eye exam Encouraged routine foot exam Encouraged him to get a flu shot fall Pneumovax UTD Encouraged him to get a COVID-vaccine

## 2023-01-24 NOTE — Assessment & Plan Note (Signed)
Currently not medicated 

## 2023-01-24 NOTE — Assessment & Plan Note (Signed)
Encourage diet and exercise for weight loss 

## 2023-01-24 NOTE — Assessment & Plan Note (Signed)
He is not interested in medication management at this time

## 2023-01-24 NOTE — Progress Notes (Signed)
HPI  Patient presents to clinic today to reestablish care and for management of the conditions listed below.  Anxiety and Depression: Chronic, but stable off meds.  He is not currently seeing a therapist.  He denies SI/HI.  DM2: His last A1c was 8.1%, 01/2022.  He is taking Lantus, Metformin and Ozempic as prescribed.  His sugars range 175-300.  He checks his feet routinely.  His last eye exam was > 1 year ago.  Flu 07/2014.  Pneumovax 08/2020.  COVID never.  HLD: His last LDL was 64, triglycerides 714, 01/2022.  He denies myalgias on Atorvastatin.  He does not consume low-fat diet.  GERD: Triggered by spicy food.Marland Kitchen  He denies breakthrough on Pantoprazole.  Upper GI from 08/2020 reviewed.  ED: He is not longer taking Sildenafil as needed.  He does not follow with urology.  Insomnia: He is having difficulty falling and staying asleep. He is not taking any medications for this. There is no sleep study on file.   Past Medical History:  Diagnosis Date   Anxiety    Chicken pox    Diabetes mellitus without complication (HCC)    Pneumonia    Skin cancer of face     Current Outpatient Medications  Medication Sig Dispense Refill   albuterol (VENTOLIN HFA) 108 (90 Base) MCG/ACT inhaler Inhale 2 puffs into the lungs every 6 (six) hours as needed for wheezing or shortness of breath. 1 each 0   ALPRAZolam (XANAX) 0.5 MG tablet TAKE 1 TABLET BY MOUTH AT BEDTIME AS NEEDED FOR ANXIETY 15 tablet 0   atorvastatin (LIPITOR) 20 MG tablet Take 1 tablet (20 mg total) by mouth daily. for cholesterol. 90 tablet 0   fluticasone (FLONASE) 50 MCG/ACT nasal spray Place 2 sprays into both nostrils daily. 9.9 mL 0   icosapent Ethyl (VASCEPA) 1 g capsule Take 2 capsules (2 g total) by mouth 2 (two) times daily. 360 capsule 1   insulin glargine (LANTUS) 100 UNIT/ML Solostar Pen Inject 36 Units into the skin at bedtime. 30 mL 3   insulin lispro (HUMALOG) 200 UNIT/ML KwikPen Inject 8-12 Units into the skin 3 (three)  times daily before meals. (Patient not taking: Reported on 01/13/2021) 30 mL 3   Insulin Pen Needle (PEN NEEDLES 3/16") 31G X 5 MM MISC Use with insulin pen as directed 100 each 2   metFORMIN (GLUCOPHAGE-XR) 750 MG 24 hr tablet Take 1 tablet (750 mg total) by mouth daily with breakfast. 90 tablet 1   pantoprazole (PROTONIX) 40 MG tablet Take 1 tablet (40 mg total) by mouth daily. 30 tablet 1   Semaglutide,0.25 or 0.'5MG'$ /DOS, (OZEMPIC, 0.25 OR 0.5 MG/DOSE,) 2 MG/1.5ML SOPN Inject 0.5 mg into the skin once a week. (Patient not taking: Reported on 01/27/2022) 4.5 mL 3   sildenafil (VIAGRA) 50 MG tablet TAKE 1 TABLET BY MOUTH EVERY DAY AS NEEDED FOR ERECTILE DYSFUNCTION 10 tablet 2   No current facility-administered medications for this visit.    Allergies  Allergen Reactions   Bee Venom Anaphylaxis    Family History  Problem Relation Age of Onset   Heart disease Mother    Diabetes Mother    Parkinson's disease Mother    Heart disease Father    Diabetes Father    Hypertension Father    Hyperlipidemia Father    Cancer Neg Hx    Stroke Neg Hx     Social History   Socioeconomic History   Marital status: Divorced    Spouse name: Not  on file   Number of children: 2   Years of education: Not on file   Highest education level: Not on file  Occupational History   Occupation: Lab tech-mechanic  Tobacco Use   Smoking status: Never   Smokeless tobacco: Current    Types: Chew  Substance and Sexual Activity   Alcohol use: Yes    Alcohol/week: 0.0 standard drinks of alcohol    Comment: 1-2 times a month, 3 drinks at a time   Drug use: Yes    Types: Marijuana   Sexual activity: Yes  Other Topics Concern   Not on file  Social History Narrative   Not on file   Social Determinants of Health   Financial Resource Strain: Not on file  Food Insecurity: Not on file  Transportation Needs: Not on file  Physical Activity: Not on file  Stress: Not on file  Social Connections: Not on file   Intimate Partner Violence: Not on file    ROS:  Constitutional: Denies fever, malaise, fatigue, headache or abrupt weight changes.  HEENT: Denies eye pain, eye redness, ear pain, ringing in the ears, wax buildup, runny nose, nasal congestion, bloody nose, or sore throat. Respiratory: Denies difficulty breathing, shortness of breath, cough or sputum production.   Cardiovascular: Denies chest pain, chest tightness, palpitations or swelling in the hands or feet.  Gastrointestinal: Denies abdominal pain, bloating, constipation, diarrhea or blood in the stool.  GU: Patient reports erectile dysfunction.  Denies frequency, urgency, pain with urination, blood in urine, odor or discharge. Musculoskeletal: Denies decrease in range of motion, difficulty with gait, muscle pain or joint pain and swelling.  Skin: Denies redness, rashes, lesions or ulcercations.  Neurological: Patient reports intermittent neuropathic pain.  Denies dizziness, difficulty with memory, difficulty with speech or problems with balance and coordination.  Psych: Patient has a history of anxiety and depression.  Denies SI/HI.  No other specific complaints in a complete review of systems (except as listed in HPI above).  PE:  BP 134/82 (BP Location: Left Arm, Patient Position: Sitting, Cuff Size: Normal)   Pulse 100   Temp (!) 96.8 F (36 C) (Temporal)   Ht 5' 11.5" (1.816 m)   Wt 207 lb (93.9 kg)   SpO2 99%   BMI 28.47 kg/m   Wt Readings from Last 3 Encounters:  01/27/22 210 lb (95.3 kg)  11/04/21 205 lb (93 kg)  01/13/21 204 lb (92.5 kg)    General: Appears his stated age, overweight in NAD. Skin: No ulcerations noted. HEENT: Head: normal shape and size; Eyes: sclera white, no icterus, conjunctiva pink, PERRLA and EOMs intact;  Cardiovascular: Normal rate and rhythm. S1,S2 noted.  No murmur, rubs or gallops noted. No JVD or BLE edema. Pulmonary/Chest: Normal effort and positive vesicular breath sounds. No  respiratory distress. No wheezes, rales or ronchi noted.  Abdomen:  Normal bowel sounds. Musculoskeletal:  No difficulty with gait.  Neurological: Alert and oriented.  Coordination normal.  Psychiatric: Mood and affect normal. Behavior is normal. Judgment and thought content normal.     BMET    Component Value Date/Time   NA 137 01/27/2022 1103   K 4.6 01/27/2022 1103   CL 101 01/27/2022 1103   CO2 24 01/27/2022 1103   GLUCOSE 219 (H) 01/27/2022 1103   GLUCOSE 227 (H) 09/11/2020 0105   BUN 12 01/27/2022 1103   CREATININE 0.79 01/27/2022 1103   CALCIUM 9.7 01/27/2022 1103   GFRNONAA 104 09/28/2020 1635   GFRNONAA >60 09/11/2020  0105   GFRAA 121 09/28/2020 1635    Lipid Panel     Component Value Date/Time   CHOL 204 (H) 01/27/2022 1103   TRIG 714 (HH) 01/27/2022 1103   HDL 32 (L) 01/27/2022 1103   CHOLHDL 6.4 (H) 01/27/2022 1103   LDLCALC 64 01/27/2022 1103    CBC    Component Value Date/Time   WBC 5.1 01/27/2022 1103   WBC 3.7 (L) 09/11/2020 0105   RBC 5.08 01/27/2022 1103   RBC 2.70 (L) 09/11/2020 0105   HGB 16.5 01/27/2022 1103   HCT 47.5 01/27/2022 1103   PLT 240 01/27/2022 1103   MCV 94 01/27/2022 1103   MCH 32.5 01/27/2022 1103   MCH 30.4 09/11/2020 0105   MCHC 34.7 01/27/2022 1103   MCHC 33.1 09/11/2020 0105   RDW 12.7 01/27/2022 1103   LYMPHSABS 1.8 01/27/2022 1103   MONOABS 0.4 09/11/2020 0105   EOSABS 0.1 01/27/2022 1103   BASOSABS 0.1 01/27/2022 1103    Hgb A1C Lab Results  Component Value Date   HGBA1C 8.1 (H) 01/27/2022     Assessment and Plan:   RTC in 3 months for follow-up of chronic conditions Webb Silversmith, NP

## 2023-01-24 NOTE — Assessment & Plan Note (Signed)
Stable off meds Support offered 

## 2023-01-25 ENCOUNTER — Telehealth: Payer: Self-pay

## 2023-01-25 DIAGNOSIS — E1365 Other specified diabetes mellitus with hyperglycemia: Secondary | ICD-10-CM

## 2023-01-25 LAB — LIPID PANEL
Cholesterol: 201 mg/dL — ABNORMAL HIGH (ref <200)
HDL: 33 mg/dL — ABNORMAL LOW (ref >=40)
Non-HDL Cholesterol (Calc): 168 mg/dL (calc) — ABNORMAL HIGH (ref <130)
Total CHOL/HDL Ratio: 6.1 (calc) — ABNORMAL HIGH (ref <5.0)
Triglycerides: 704 mg/dL — ABNORMAL HIGH (ref <150)

## 2023-01-25 LAB — COMPLETE METABOLIC PANEL WITH GFR
AG Ratio: 1.8 (calc) (ref 1.0–2.5)
ALT: 17 U/L (ref 9–46)
AST: 10 U/L (ref 10–40)
Albumin: 4.4 g/dL (ref 3.6–5.1)
Alkaline phosphatase (APISO): 79 U/L (ref 36–130)
BUN: 16 mg/dL (ref 7–25)
CO2: 19 mmol/L — ABNORMAL LOW (ref 20–32)
Calcium: 9.3 mg/dL (ref 8.6–10.3)
Chloride: 104 mmol/L (ref 98–110)
Creat: 0.7 mg/dL (ref 0.60–1.29)
Globulin: 2.4 g/dL (calc) (ref 1.9–3.7)
Glucose, Bld: 267 mg/dL — ABNORMAL HIGH (ref 65–99)
Potassium: 4.2 mmol/L (ref 3.5–5.3)
Sodium: 137 mmol/L (ref 135–146)
Total Bilirubin: 0.6 mg/dL (ref 0.2–1.2)
Total Protein: 6.8 g/dL (ref 6.1–8.1)
eGFR: 115 mL/min/{1.73_m2} (ref >=60)

## 2023-01-25 LAB — CBC
HCT: 49.7 % (ref 38.5–50.0)
Hemoglobin: 17 g/dL (ref 13.2–17.1)
MCH: 32 pg (ref 27.0–33.0)
MCHC: 34.2 g/dL (ref 32.0–36.0)
MCV: 93.4 fL (ref 80.0–100.0)
MPV: 10.4 fL (ref 7.5–12.5)
Platelets: 226 10*3/uL (ref 140–400)
RBC: 5.32 10*6/uL (ref 4.20–5.80)
RDW: 13 % (ref 11.0–15.0)
WBC: 5.2 10*3/uL (ref 3.8–10.8)

## 2023-01-25 LAB — HEMOGLOBIN A1C
Hgb A1c MFr Bld: 9.7 % of total Hgb — ABNORMAL HIGH (ref <5.7)
Mean Plasma Glucose: 232 mg/dL
eAG (mmol/L): 12.8 mmol/L

## 2023-01-25 LAB — MICROALBUMIN / CREATININE URINE RATIO
Creatinine, Urine: 77 mg/dL (ref 20–320)
Microalb Creat Ratio: 16 mcg/mg creat (ref <30)
Microalb, Ur: 1.2 mg/dL

## 2023-01-25 LAB — HEPATITIS C ANTIBODY: Hepatitis C Ab: NONREACTIVE

## 2023-01-25 NOTE — Telephone Encounter (Signed)
-----   Message from Jearld Fenton, NP sent at 01/25/2023 10:38 AM EDT ----- A1c is up to 9.7% which indicates uncontrolled diabetes.  I reviewed Dr. Arman Filter note and she did not mention anything about type 1 diabetes and has not done the workup for this.  I can order additional lab testing for evaluation of type 1.5 diabetes if he would like, please let me know and I will place orders and have him schedule lab only appointment.  Liver and kidney function is normal.  Cholesterol remains uncontrolled.  I would recommend we stop atorvastatin and start rosuvastatin 20 mg daily in addition to Zetia 10 mg daily.  Let me know if he is agreeable with this and I will send this in.  Blood counts are normal.

## 2023-01-25 NOTE — Telephone Encounter (Signed)
Pt advised. He agreed to the lab work, starting rosuvastatin and Zetia.  Please send to [facility].   Thanks,   -Mickel Baas

## 2023-01-25 NOTE — Addendum Note (Signed)
Addended by: Jearld Fenton on: 01/25/2023 08:55 AM   Modules accepted: Level of Service

## 2023-01-26 MED ORDER — ROSUVASTATIN CALCIUM 20 MG PO TABS: 20.0000 mg | ORAL_TABLET | Freq: Every day | ORAL | 1 refills | Status: AC

## 2023-01-26 MED ORDER — EZETIMIBE 10 MG PO TABS: 10.0000 mg | ORAL_TABLET | Freq: Every day | ORAL | 1 refills | Status: AC

## 2023-01-26 NOTE — Addendum Note (Signed)
Addended by: Jearld Fenton on: 01/26/2023 10:20 AM   Modules accepted: Orders

## 2023-01-26 NOTE — Telephone Encounter (Signed)
Rosuvastatin and ezetimibe sent to pharmacy.  Future labs ordered.

## 2023-01-26 NOTE — Addendum Note (Signed)
Addended by: Jearld Fenton on: 01/26/2023 09:19 AM   Modules accepted: Orders

## 2023-02-02 ENCOUNTER — Other Ambulatory Visit: Payer: 59

## 2023-02-02 ENCOUNTER — Other Ambulatory Visit: Payer: Self-pay

## 2023-02-02 DIAGNOSIS — E1365 Other specified diabetes mellitus with hyperglycemia: Secondary | ICD-10-CM

## 2023-02-16 ENCOUNTER — Other Ambulatory Visit: Payer: 59

## 2023-04-27 ENCOUNTER — Ambulatory Visit: Payer: 59 | Admitting: Internal Medicine

## 2023-04-27 ENCOUNTER — Other Ambulatory Visit: Payer: 59

## 2023-05-03 ENCOUNTER — Other Ambulatory Visit: Payer: 59

## 2023-05-03 ENCOUNTER — Ambulatory Visit: Payer: 59 | Admitting: Internal Medicine

## 2023-05-03 DIAGNOSIS — E1365 Other specified diabetes mellitus with hyperglycemia: Secondary | ICD-10-CM

## 2023-05-22 LAB — INSULIN ANTIBODIES, BLOOD: Insulin Antibodies, Human: <0.4 U/mL (ref <0.4)

## 2023-05-22 LAB — GLUTAMIC ACID DECARBOXYLASE AUTO ABS: Glutamic Acid Decarb Ab: <5 IU/mL (ref <5)

## 2023-05-22 LAB — ZNT8 ANTIBODIES: ZNT8 Antibodies: <10 U/mL (ref <15)

## 2023-05-22 LAB — C-PEPTIDE: C-Peptide: 2.19 ng/mL (ref 0.80–3.85)

## 2023-05-29 ENCOUNTER — Ambulatory Visit: Payer: 59 | Admitting: Internal Medicine

## 2023-09-18 ENCOUNTER — Telehealth: Payer: Self-pay

## 2023-09-18 NOTE — Telephone Encounter (Unsigned)
Copied from CRM [id number]. Topic: General - Other >> Sep 18, 2023  3:32 PM Phill Myron wrote: Mr Seiber would like a letter  stating that he has no work restrictions  and would like it sent to,,,, attn: Tyron Russell  with [employer] fax [phone].... ofc# [phone]

## 2023-09-19 NOTE — Telephone Encounter (Signed)
He is past due for an appt with me. Have him schedule this and we can discuss this letter at that appt

## 2023-09-20 NOTE — Telephone Encounter (Signed)
Left message for patient to call and schedule an appointment regarding his needs for work restrictions.
